# Patient Record
Sex: Male | Born: 1955 | Race: Black or African American | Hispanic: No | Marital: Single | Smoking: Former smoker
Health system: Southern US, Community
[De-identification: ages and names within clinical notes are randomized; demographics above are authoritative.]

## PROBLEM LIST (undated history)

## (undated) DIAGNOSIS — D638 Anemia in other chronic diseases classified elsewhere: Secondary | ICD-10-CM

## (undated) DIAGNOSIS — E119 Type 2 diabetes mellitus without complications: Secondary | ICD-10-CM

## (undated) DIAGNOSIS — N138 Other obstructive and reflux uropathy: Secondary | ICD-10-CM

## (undated) DIAGNOSIS — Z973 Presence of spectacles and contact lenses: Secondary | ICD-10-CM

## (undated) DIAGNOSIS — N200 Calculus of kidney: Secondary | ICD-10-CM

## (undated) DIAGNOSIS — R809 Proteinuria, unspecified: Secondary | ICD-10-CM

## (undated) DIAGNOSIS — Z87442 Personal history of urinary calculi: Secondary | ICD-10-CM

## (undated) DIAGNOSIS — E1129 Type 2 diabetes mellitus with other diabetic kidney complication: Secondary | ICD-10-CM

## (undated) DIAGNOSIS — E042 Nontoxic multinodular goiter: Secondary | ICD-10-CM

## (undated) DIAGNOSIS — N183 Chronic kidney disease, stage 3 unspecified: Secondary | ICD-10-CM

## (undated) DIAGNOSIS — E785 Hyperlipidemia, unspecified: Secondary | ICD-10-CM

## (undated) DIAGNOSIS — I639 Cerebral infarction, unspecified: Secondary | ICD-10-CM

## (undated) DIAGNOSIS — I10 Essential (primary) hypertension: Secondary | ICD-10-CM

## (undated) DIAGNOSIS — G629 Polyneuropathy, unspecified: Secondary | ICD-10-CM

## (undated) DIAGNOSIS — Z9221 Personal history of antineoplastic chemotherapy: Secondary | ICD-10-CM

## (undated) DIAGNOSIS — D649 Anemia, unspecified: Secondary | ICD-10-CM

## (undated) DIAGNOSIS — C801 Malignant (primary) neoplasm, unspecified: Secondary | ICD-10-CM

## (undated) DIAGNOSIS — N2581 Secondary hyperparathyroidism of renal origin: Secondary | ICD-10-CM

## (undated) DIAGNOSIS — R82991 Hypocitraturia: Secondary | ICD-10-CM

## (undated) DIAGNOSIS — N401 Enlarged prostate with lower urinary tract symptoms: Secondary | ICD-10-CM

## (undated) DIAGNOSIS — E8722 Chronic metabolic acidosis: Secondary | ICD-10-CM

## (undated) HISTORY — PX: APPENDECTOMY: SHX54

## (undated) HISTORY — DX: Anemia, unspecified: D64.9

---
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
## 1988-02-03 HISTORY — PX: PARTIAL COLECTOMY: SHX5273

## 1988-09-02 DIAGNOSIS — Z85038 Personal history of other malignant neoplasm of large intestine: Secondary | ICD-10-CM

## 1988-09-02 HISTORY — PX: SUBTOTAL COLECTOMY: SHX855

## 1988-09-02 HISTORY — DX: Personal history of other malignant neoplasm of large intestine: Z85.038

## 2000-01-21 ENCOUNTER — Encounter: Payer: Self-pay | Admitting: General Surgery

## 2000-01-21 ENCOUNTER — Encounter: Admission: RE | Admit: 2000-01-21 | Discharge: 2000-01-21 | Payer: Self-pay | Admitting: General Surgery

## 2002-02-02 DIAGNOSIS — Z8639 Personal history of other endocrine, nutritional and metabolic disease: Secondary | ICD-10-CM

## 2002-02-02 HISTORY — DX: Personal history of other endocrine, nutritional and metabolic disease: Z86.39

## 2002-11-20 ENCOUNTER — Encounter: Payer: Self-pay | Admitting: Internal Medicine

## 2002-11-20 ENCOUNTER — Ambulatory Visit (HOSPITAL_COMMUNITY): Admission: RE | Admit: 2002-11-20 | Discharge: 2002-11-20 | Payer: Self-pay | Admitting: Internal Medicine

## 2002-12-25 ENCOUNTER — Other Ambulatory Visit: Admission: RE | Admit: 2002-12-25 | Discharge: 2002-12-25 | Payer: Self-pay | Admitting: Diagnostic Radiology

## 2003-01-19 ENCOUNTER — Ambulatory Visit (HOSPITAL_COMMUNITY): Admission: RE | Admit: 2003-01-19 | Discharge: 2003-01-19 | Payer: Self-pay | Admitting: Endocrinology

## 2007-10-31 ENCOUNTER — Emergency Department (HOSPITAL_COMMUNITY): Admission: EM | Admit: 2007-10-31 | Discharge: 2007-10-31 | Payer: Self-pay | Admitting: Emergency Medicine

## 2009-03-14 ENCOUNTER — Emergency Department (HOSPITAL_COMMUNITY): Admission: EM | Admit: 2009-03-14 | Discharge: 2009-03-14 | Payer: Self-pay | Admitting: Emergency Medicine

## 2010-04-24 LAB — POCT CARDIAC MARKERS
CKMB, poc: <1 ng/mL — ABNORMAL LOW (ref 1.0–8.0)
Myoglobin, poc: 36.5 ng/mL (ref 12–200)

## 2010-04-24 LAB — DIFFERENTIAL
Basophils Relative: 1 % (ref 0–1)
Monocytes Relative: 7 % (ref 3–12)
Neutro Abs: 3 10*3/uL (ref 1.7–7.7)
Neutrophils Relative %: 58 % (ref 43–77)

## 2010-04-24 LAB — BASIC METABOLIC PANEL
BUN: 7 mg/dL (ref 6–23)
CO2: 25 mEq/L (ref 19–32)
Calcium: 8.9 mg/dL (ref 8.4–10.5)
Chloride: 105 mEq/L (ref 96–112)
Creatinine, Ser: 0.84 mg/dL (ref 0.4–1.5)
GFR calc Af Amer: >60 mL/min (ref >60)
GFR calc non Af Amer: >60 mL/min (ref >60)
Glucose, Bld: 303 mg/dL — ABNORMAL HIGH (ref 70–99)
Potassium: 3.9 mEq/L (ref 3.5–5.1)
Sodium: 137 mEq/L (ref 135–145)

## 2010-04-24 LAB — CBC
MCHC: 34.6 g/dL (ref 30.0–36.0)
RBC: 5.17 MIL/uL (ref 4.22–5.81)
WBC: 5.2 10*3/uL (ref 4.0–10.5)

## 2010-04-24 LAB — GLUCOSE, CAPILLARY: Glucose-Capillary: 291 mg/dL — ABNORMAL HIGH (ref 70–99)

## 2010-11-03 LAB — URINALYSIS, ROUTINE W REFLEX MICROSCOPIC
Bilirubin Urine: NEGATIVE
Glucose, UA: NEGATIVE
Ketones, ur: NEGATIVE
Leukocytes, UA: NEGATIVE
Protein, ur: NEGATIVE

## 2019-03-06 ENCOUNTER — Emergency Department (HOSPITAL_COMMUNITY)
Admission: EM | Admit: 2019-03-06 | Discharge: 2019-03-06 | Disposition: A | Discharge status: Home / Self Care | Payer: Self-pay | Attending: Emergency Medicine | Admitting: Emergency Medicine

## 2019-03-06 ENCOUNTER — Encounter (HOSPITAL_COMMUNITY): Payer: Self-pay | Admitting: Emergency Medicine

## 2019-03-06 ENCOUNTER — Other Ambulatory Visit: Payer: Self-pay

## 2019-03-06 DIAGNOSIS — E1165 Type 2 diabetes mellitus with hyperglycemia: Secondary | ICD-10-CM | POA: Insufficient documentation

## 2019-03-06 DIAGNOSIS — Y999 Unspecified external cause status: Secondary | ICD-10-CM | POA: Insufficient documentation

## 2019-03-06 DIAGNOSIS — T383X6A Underdosing of insulin and oral hypoglycemic [antidiabetic] drugs, initial encounter: Secondary | ICD-10-CM | POA: Insufficient documentation

## 2019-03-06 DIAGNOSIS — R739 Hyperglycemia, unspecified: Secondary | ICD-10-CM

## 2019-03-06 DIAGNOSIS — Z91120 Patient's intentional underdosing of medication regimen due to financial hardship: Secondary | ICD-10-CM | POA: Insufficient documentation

## 2019-03-06 DIAGNOSIS — Z5189 Encounter for other specified aftercare: Secondary | ICD-10-CM

## 2019-03-06 DIAGNOSIS — Y9389 Activity, other specified: Secondary | ICD-10-CM | POA: Insufficient documentation

## 2019-03-06 DIAGNOSIS — T23031A Burn of unspecified degree of multiple right fingers (nail), not including thumb, initial encounter: Secondary | ICD-10-CM | POA: Insufficient documentation

## 2019-03-06 DIAGNOSIS — X16XXXA Contact with hot heating appliances, radiators and pipes, initial encounter: Secondary | ICD-10-CM | POA: Insufficient documentation

## 2019-03-06 DIAGNOSIS — Y9289 Other specified places as the place of occurrence of the external cause: Secondary | ICD-10-CM | POA: Insufficient documentation

## 2019-03-06 DIAGNOSIS — Z23 Encounter for immunization: Secondary | ICD-10-CM | POA: Insufficient documentation

## 2019-03-06 HISTORY — DX: Malignant (primary) neoplasm, unspecified: C80.1

## 2019-03-06 HISTORY — DX: Type 2 diabetes mellitus without complications: E11.9

## 2019-03-06 LAB — CBG MONITORING, ED: Glucose-Capillary: 401 mg/dL — ABNORMAL HIGH (ref 70–99)

## 2019-03-06 MED ORDER — METFORMIN HCL 500 MG PO TABS: 500.0000 mg | ORAL_TABLET | Freq: Two times a day (BID) | ORAL | 6 refills | Status: DC

## 2019-03-06 MED ORDER — TETANUS-DIPHTH-ACELL PERTUSSIS 5-2.5-18.5 LF-MCG/0.5 IM SUSP
0.5000 mL | Freq: Once | INTRAMUSCULAR | Status: AC
  Administered 2019-03-06: 0.5 mL via INTRAMUSCULAR
  Filled 2019-03-06: qty 0.5
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
## 2019-03-20 ENCOUNTER — Other Ambulatory Visit: Payer: Self-pay

## 2019-03-20 ENCOUNTER — Encounter (HOSPITAL_COMMUNITY): Payer: Self-pay | Admitting: *Deleted

## 2019-03-20 ENCOUNTER — Observation Stay (HOSPITAL_COMMUNITY)
Admission: EM | Admit: 2019-03-20 | Discharge: 2019-03-21 | Disposition: A | Discharge status: Home / Self Care | Payer: Self-pay | Attending: Internal Medicine | Admitting: Internal Medicine

## 2019-03-20 ENCOUNTER — Emergency Department (HOSPITAL_COMMUNITY): Payer: Self-pay

## 2019-03-20 ENCOUNTER — Observation Stay (HOSPITAL_COMMUNITY): Payer: Self-pay

## 2019-03-20 DIAGNOSIS — G459 Transient cerebral ischemic attack, unspecified: Principal | ICD-10-CM | POA: Diagnosis present

## 2019-03-20 DIAGNOSIS — Z8673 Personal history of transient ischemic attack (TIA), and cerebral infarction without residual deficits: Secondary | ICD-10-CM

## 2019-03-20 DIAGNOSIS — Z7984 Long term (current) use of oral hypoglycemic drugs: Secondary | ICD-10-CM | POA: Insufficient documentation

## 2019-03-20 DIAGNOSIS — Z20822 Contact with and (suspected) exposure to covid-19: Secondary | ICD-10-CM | POA: Insufficient documentation

## 2019-03-20 DIAGNOSIS — I639 Cerebral infarction, unspecified: Secondary | ICD-10-CM

## 2019-03-20 DIAGNOSIS — Z79899 Other long term (current) drug therapy: Secondary | ICD-10-CM | POA: Insufficient documentation

## 2019-03-20 DIAGNOSIS — Z87891 Personal history of nicotine dependence: Secondary | ICD-10-CM | POA: Insufficient documentation

## 2019-03-20 DIAGNOSIS — E1165 Type 2 diabetes mellitus with hyperglycemia: Secondary | ICD-10-CM

## 2019-03-20 DIAGNOSIS — Z85038 Personal history of other malignant neoplasm of large intestine: Secondary | ICD-10-CM | POA: Insufficient documentation

## 2019-03-20 HISTORY — DX: Personal history of transient ischemic attack (TIA), and cerebral infarction without residual deficits: Z86.73

## 2019-03-20 LAB — COMPREHENSIVE METABOLIC PANEL
ALT: 24 U/L (ref 0–44)
AST: 25 U/L (ref 15–41)
Albumin: 3.8 g/dL (ref 3.5–5.0)
Alkaline Phosphatase: 58 U/L (ref 38–126)
Anion gap: 8 (ref 5–15)
BUN: 36 mg/dL — ABNORMAL HIGH (ref 8–23)
CO2: 20 mmol/L — ABNORMAL LOW (ref 22–32)
Calcium: 9.4 mg/dL (ref 8.9–10.3)
Chloride: 107 mmol/L (ref 98–111)
Creatinine, Ser: 1.24 mg/dL (ref 0.61–1.24)
GFR calc Af Amer: >60 mL/min (ref >60)
GFR calc non Af Amer: >60 mL/min (ref >60)
Glucose, Bld: 201 mg/dL — ABNORMAL HIGH (ref 70–99)
Potassium: 4.1 mmol/L (ref 3.5–5.1)
Sodium: 135 mmol/L (ref 135–145)
Total Bilirubin: 0.9 mg/dL (ref 0.3–1.2)
Total Protein: 6.9 g/dL (ref 6.5–8.1)

## 2019-03-20 LAB — CBC
HCT: 39.5 % (ref 39.0–52.0)
Hemoglobin: 13.2 g/dL (ref 13.0–17.0)
MCH: 29.7 pg (ref 26.0–34.0)
MCHC: 33.4 g/dL (ref 30.0–36.0)
MCV: 88.8 fL (ref 80.0–100.0)
Platelets: 263 10*3/uL (ref 150–400)
RBC: 4.45 MIL/uL (ref 4.22–5.81)
RDW: 12.3 % (ref 11.5–15.5)
WBC: 5 10*3/uL (ref 4.0–10.5)
nRBC: 0 % (ref 0.0–0.2)

## 2019-03-20 LAB — DIFFERENTIAL
Abs Immature Granulocytes: 0.03 10*3/uL (ref 0.00–0.07)
Basophils Absolute: 0.1 10*3/uL (ref 0.0–0.1)
Basophils Relative: 1 %
Eosinophils Absolute: 0.1 10*3/uL (ref 0.0–0.5)
Eosinophils Relative: 1 %
Immature Granulocytes: 1 %
Lymphocytes Relative: 38 %
Lymphs Abs: 1.9 10*3/uL (ref 0.7–4.0)
Monocytes Absolute: 0.4 10*3/uL (ref 0.1–1.0)
Monocytes Relative: 8 %
Neutro Abs: 2.6 10*3/uL (ref 1.7–7.7)
Neutrophils Relative %: 51 %

## 2019-03-20 LAB — URINALYSIS, ROUTINE W REFLEX MICROSCOPIC
Bacteria, UA: NONE SEEN
Bilirubin Urine: NEGATIVE
Glucose, UA: >=500 mg/dL — AB
Hgb urine dipstick: NEGATIVE
Ketones, ur: NEGATIVE mg/dL
Leukocytes,Ua: NEGATIVE
Nitrite: NEGATIVE
Protein, ur: NEGATIVE mg/dL
Specific Gravity, Urine: 1.021 (ref 1.005–1.030)
pH: 5 (ref 5.0–8.0)

## 2019-03-20 LAB — PROTIME-INR
INR: 1 (ref 0.8–1.2)
Prothrombin Time: 13.1 seconds (ref 11.4–15.2)

## 2019-03-20 LAB — I-STAT CHEM 8, ED
BUN: 33 mg/dL — ABNORMAL HIGH (ref 8–23)
Calcium, Ion: 1.33 mmol/L (ref 1.15–1.40)
Chloride: 108 mmol/L (ref 98–111)
Creatinine, Ser: 1.2 mg/dL (ref 0.61–1.24)
Glucose, Bld: 196 mg/dL — ABNORMAL HIGH (ref 70–99)
HCT: 39 % (ref 39.0–52.0)
Hemoglobin: 13.3 g/dL (ref 13.0–17.0)
Potassium: 4.1 mmol/L (ref 3.5–5.1)
Sodium: 137 mmol/L (ref 135–145)
TCO2: 21 mmol/L — ABNORMAL LOW (ref 22–32)

## 2019-03-20 LAB — GLUCOSE, CAPILLARY
Glucose-Capillary: 148 mg/dL — ABNORMAL HIGH (ref 70–99)
Glucose-Capillary: 180 mg/dL — ABNORMAL HIGH (ref 70–99)

## 2019-03-20 LAB — VITAMIN B12: Vitamin B-12: 564 pg/mL (ref 180–914)

## 2019-03-20 LAB — APTT: aPTT: 26 seconds (ref 24–36)

## 2019-03-20 LAB — HEMOGLOBIN A1C
Hgb A1c MFr Bld: 13.7 % — ABNORMAL HIGH (ref 4.8–5.6)
Mean Plasma Glucose: 346.49 mg/dL

## 2019-03-20 LAB — RAPID URINE DRUG SCREEN, HOSP PERFORMED
Amphetamines: NOT DETECTED
Barbiturates: NOT DETECTED
Benzodiazepines: NOT DETECTED
Cocaine: NOT DETECTED
Opiates: NOT DETECTED
Tetrahydrocannabinol: NOT DETECTED

## 2019-03-20 LAB — ETHANOL: Alcohol, Ethyl (B): <10 mg/dL (ref <10)

## 2019-03-20 IMAGING — DX DG CHEST 2V
2 series · 2 of 2 positions shown · non-contrast
Comparison: None.

CLINICAL DATA: Left hand numbness.  Possible stroke today.

EXAM:
CHEST - 2 VIEW

[chest pa]
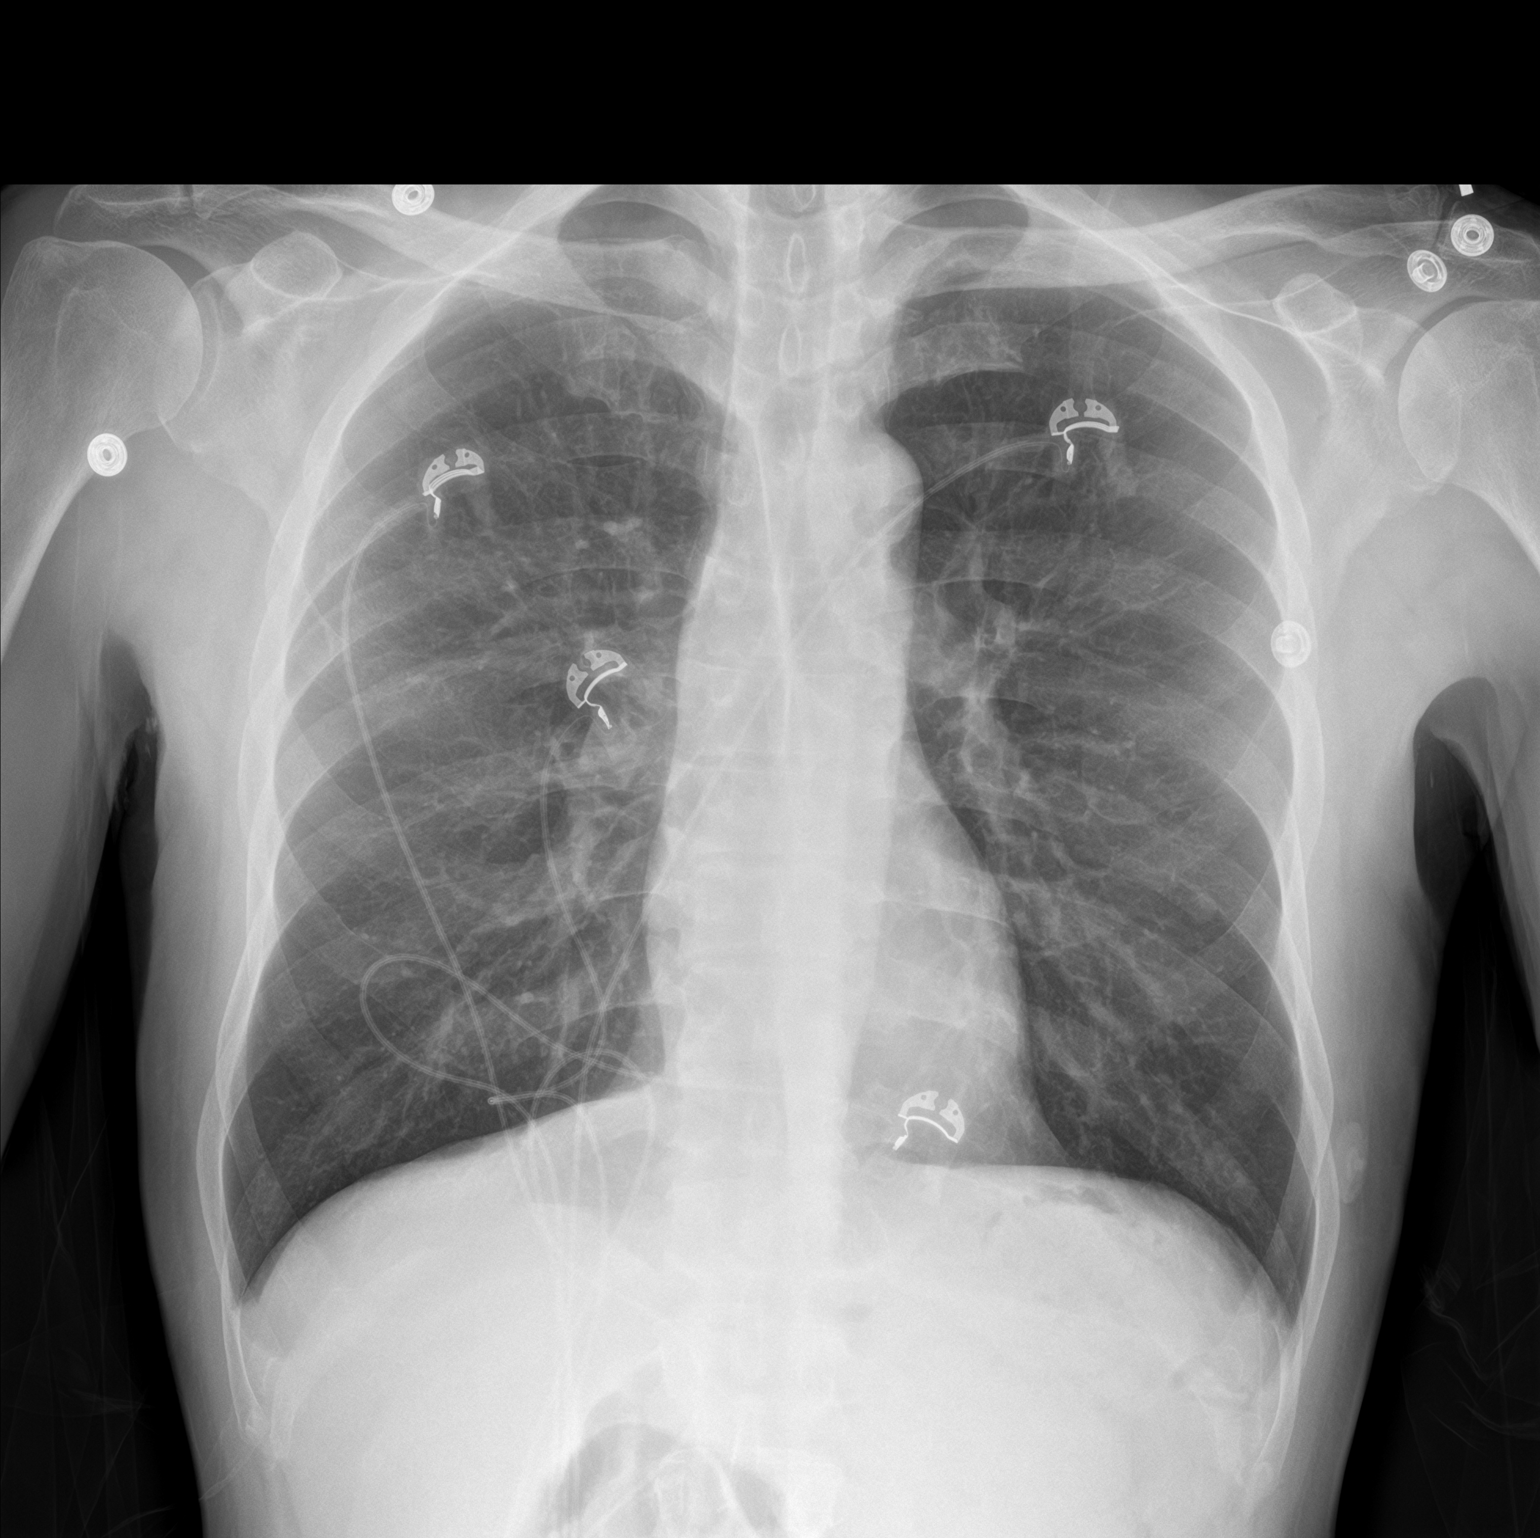

[chest lat]
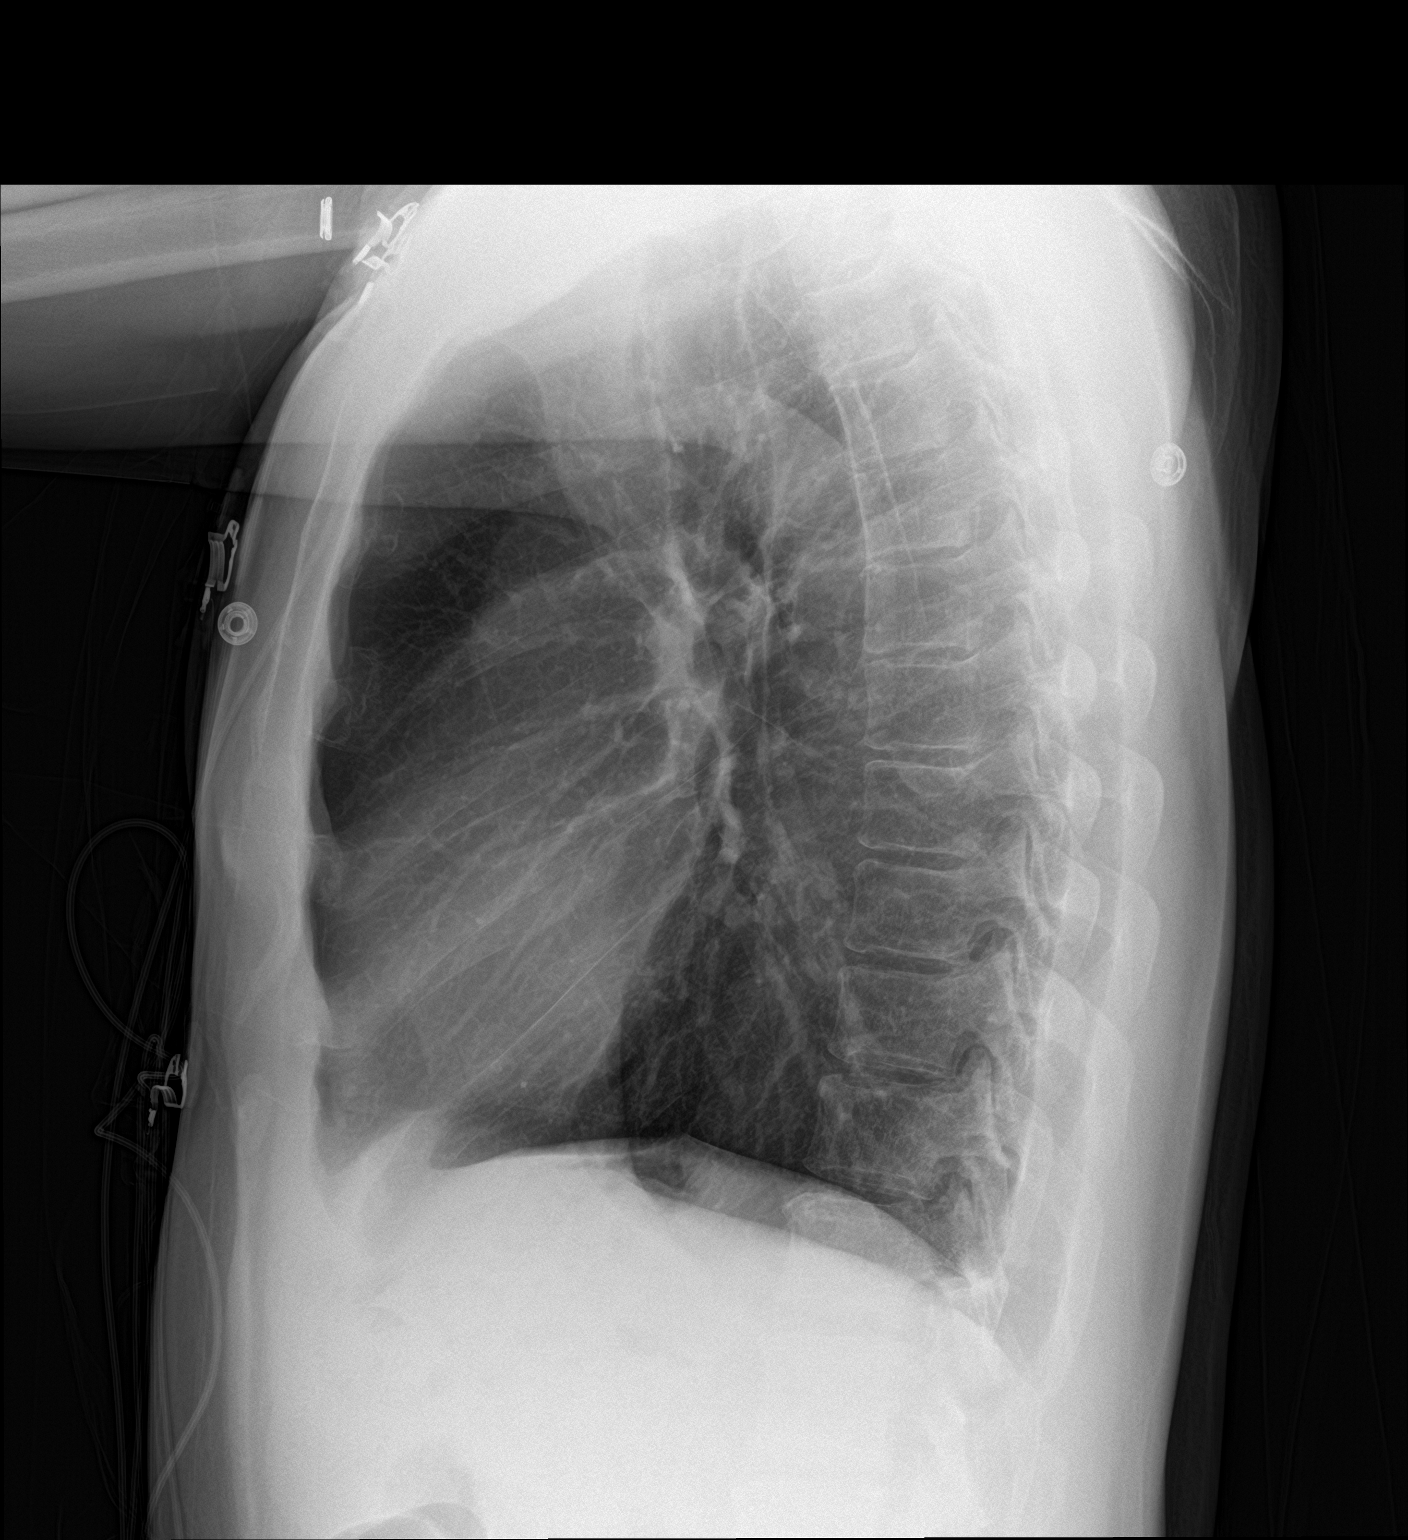

[2 of 2 positions shown; findings below may reference images not displayed]

FINDINGS: The chest is hyperexpanded with some attenuation of the pulmonary
vasculature. Lungs are clear. Heart size is normal. No pneumothorax
or pleural effusion. No acute or focal bony abnormality.
IMPRESSION: No acute disease.

The lungs appear emphysematous.

## 2019-03-20 IMAGING — CT CT HEAD CODE STROKE
3 series · 15 of 47 positions shown, 18 images · non-contrast
Comparison: None.

CLINICAL DATA: Code stroke. Left hand weakness beginning at 10
a.m.

EXAM:
CT HEAD WITHOUT CONTRAST
TECHNIQUE: Contiguous axial images were obtained from the base of the skull
through the vertex without intravenous contrast.

[Series 2: head w o · axial · 0.43mm/px · z∈[+65,+190]mm · 9 of 31 slices shown, 12 images]
[im 3/31  brain]
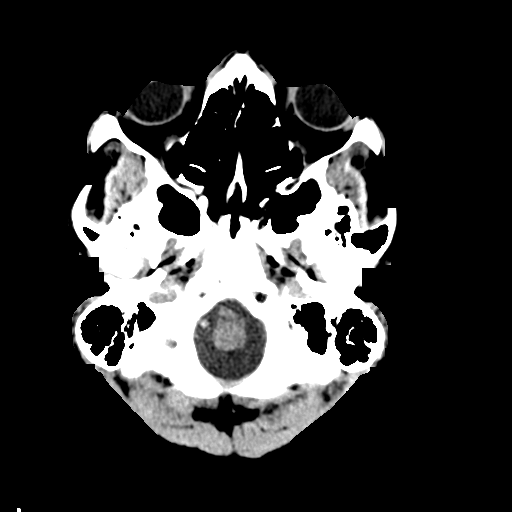
[im 3/31  bone]
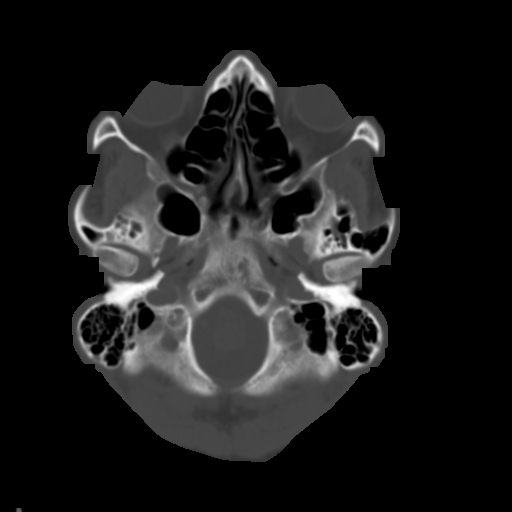
[im 6/31  brain]
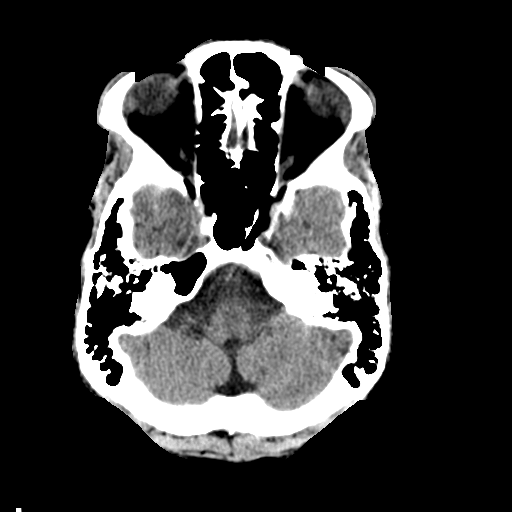
[im 9/31  brain]
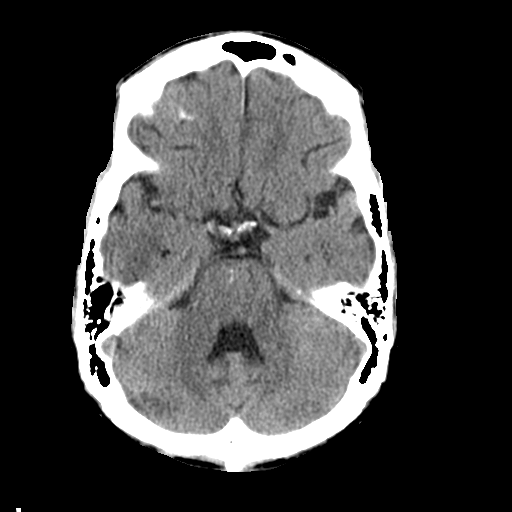
[im 12/31  brain]
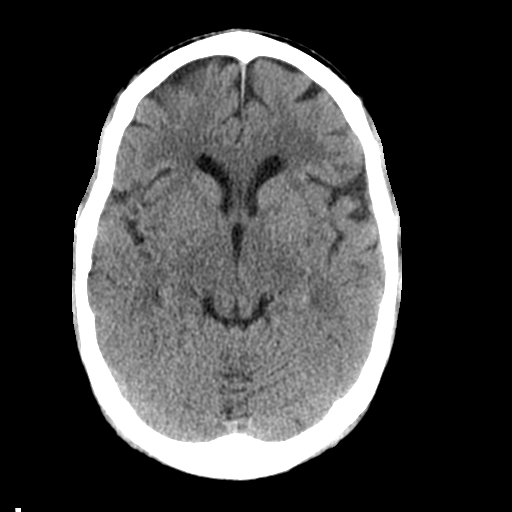
[im 16/31  brain]
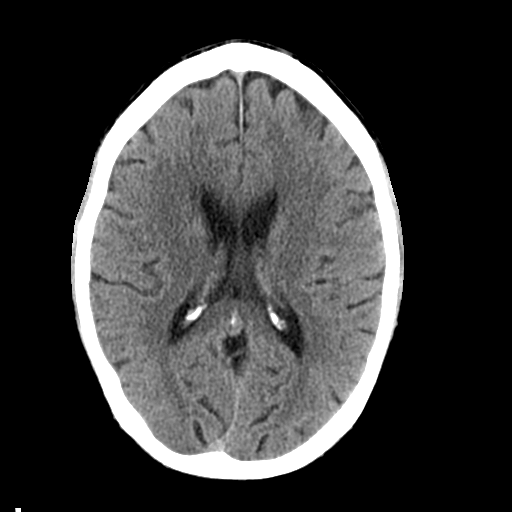
[im 16/31  bone]
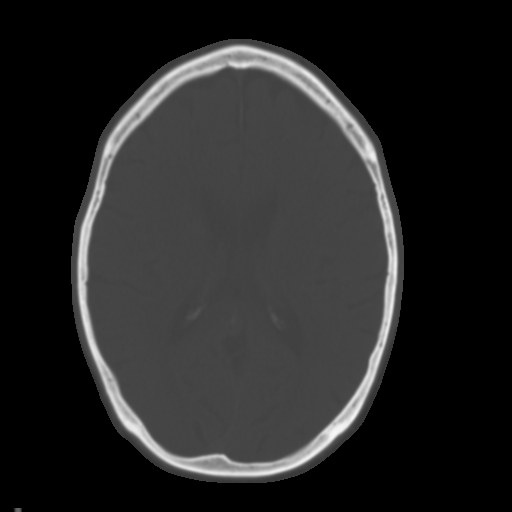
[im 19/31  brain]
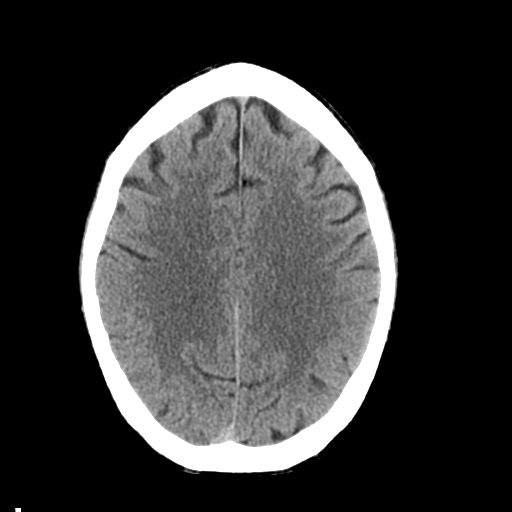
[im 22/31  brain]
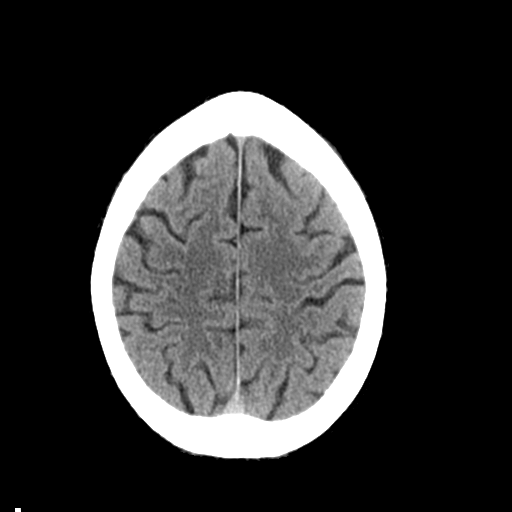
[im 25/31  brain]
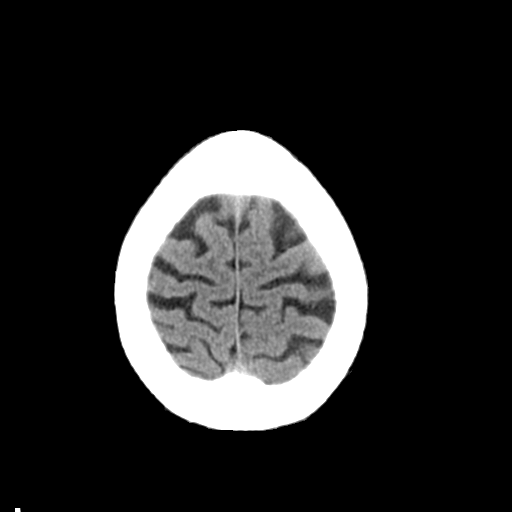
[im 28/31  brain]
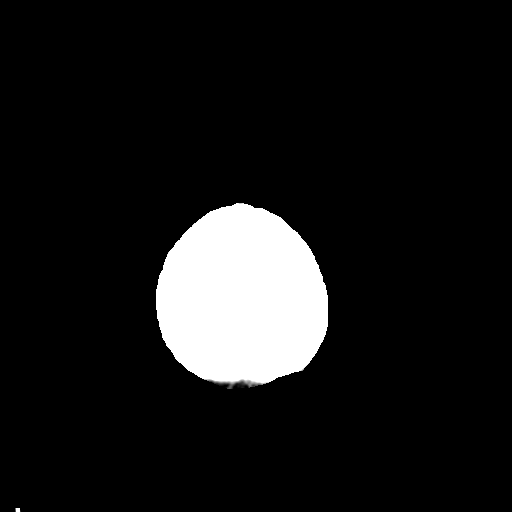
[im 28/31  bone]
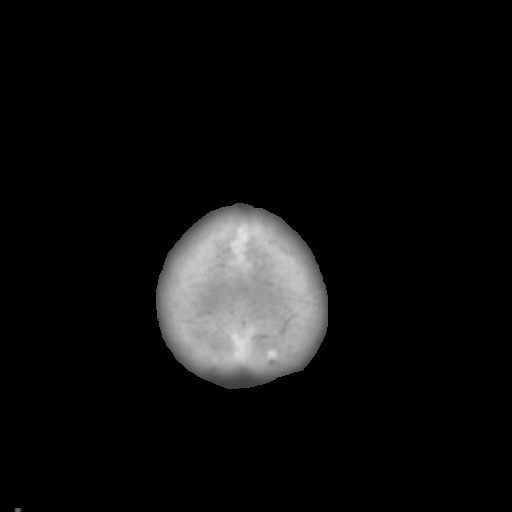

[Series 4: coronal soft · coronal · 0.31mm/px · 3 of 67 slices shown]
[im 23/67  brain]
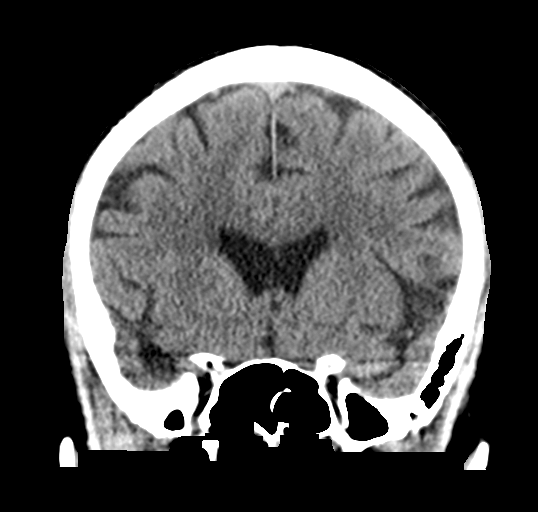
[im 30/67  brain]
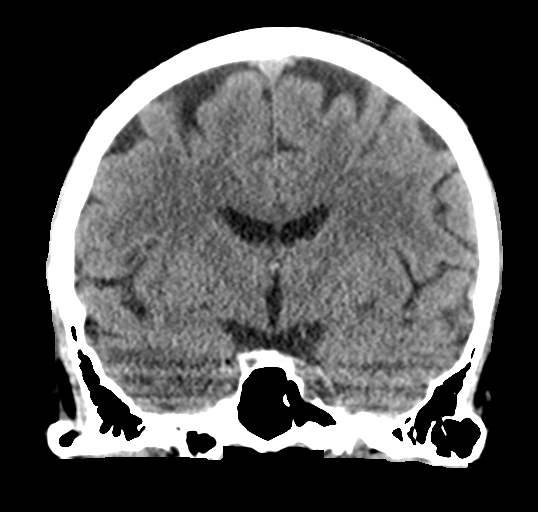
[im 37/67  brain]
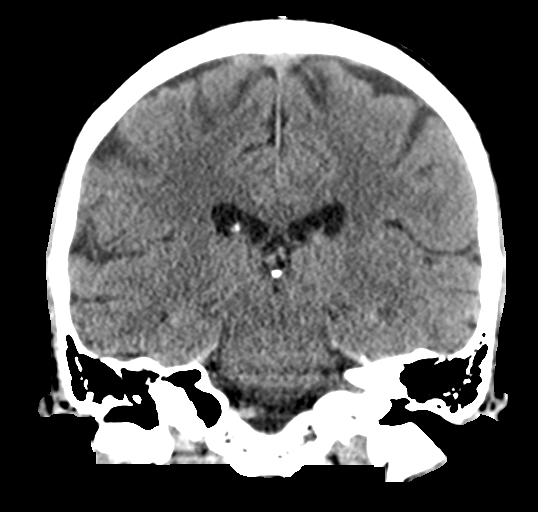

[Series 5: sagittal soft · sagittal · 0.31mm/px · 3 of 56 slices shown]
[im 19/56  brain]
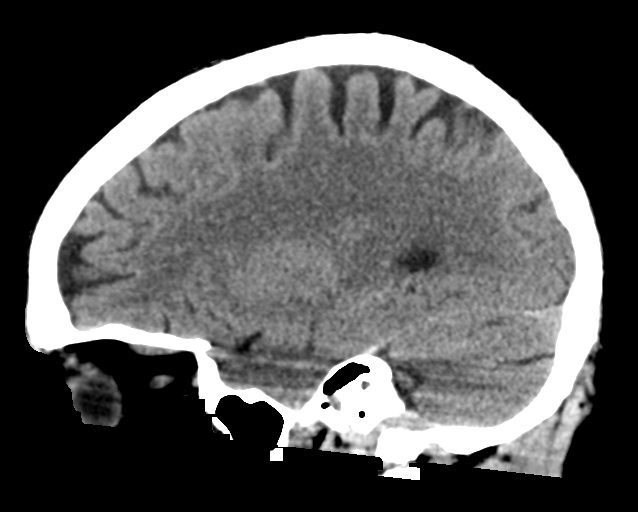
[im 28/56  brain]
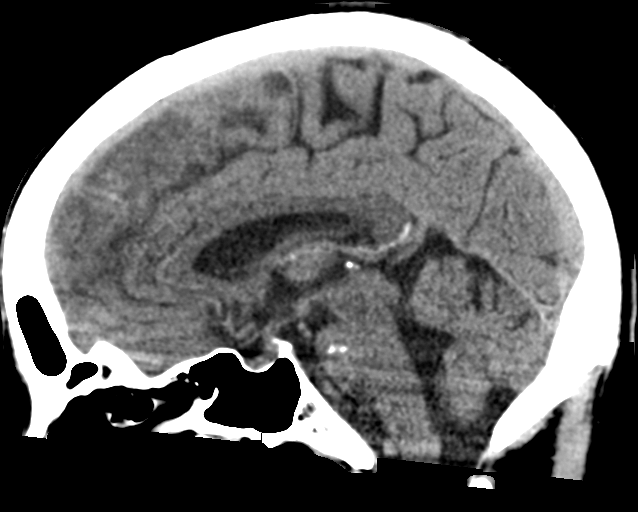
[im 37/56  brain]
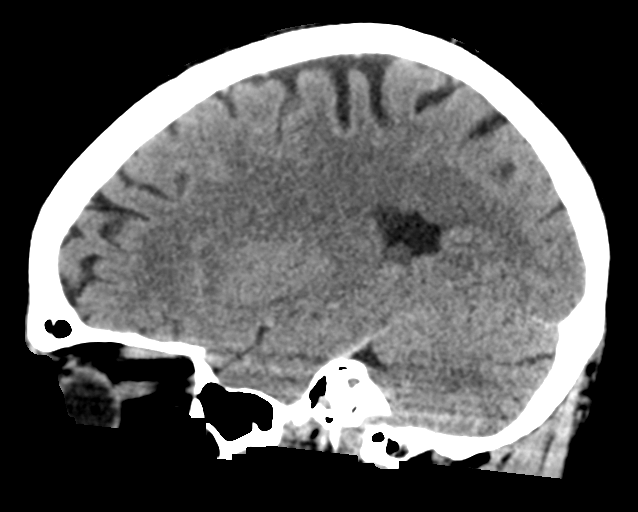

[15 of 47 positions shown; findings below may reference images not displayed]

FINDINGS: Brain: Mild atrophy and white matter hypoattenuation is present
bilaterally. No acute cortical infarct present. Basal ganglia are
intact. Insular ribbon is normal. No acute or focal cortical
abnormalities are present. The ventricles are of normal size. No
significant extraaxial fluid collection is present. The brainstem
and cerebellum are within normal limits.

Vascular: No hyperdense vessel or unexpected calcification.

Skull: Calvarium is intact. No focal lytic or blastic lesions are
present. No significant extracranial soft tissue lesion is present.

Sinuses/Orbits: The paranasal sinuses and mastoid air cells are
clear. The globes and orbits are within normal limits.

ASPECTS ([place] Stroke Program Early CT Score)

- Ganglionic level infarction (caudate, lentiform nuclei, internal
capsule, insula, M1-M3 cortex): [DATE]

- Supraganglionic infarction (M4-M6 cortex): [DATE]

Total score (0-10 with 10 being normal): [DATE]
IMPRESSION: 1. No acute intracranial abnormality.
2. Mild atrophy and white matter hypoattenuation likely reflects the
sequela of chronic microvascular ischemia.
3. ASPECTS is [DATE]

These results were called by telephone at the time of interpretation
on [DATE] at [DATE] to provider SAARTJIE, who verbally
acknowledged these results.

## 2019-03-20 MED ORDER — ACETAMINOPHEN 160 MG/5ML PO SOLN: 650.0000 mg | ORAL | Status: DC | PRN

## 2019-03-20 MED ORDER — SODIUM CHLORIDE 0.9 % IV SOLN: INTRAVENOUS | Status: DC

## 2019-03-20 MED ORDER — ONDANSETRON HCL 4 MG/2ML IJ SOLN: 4.0000 mg | Freq: Four times a day (QID) | INTRAMUSCULAR | Status: DC | PRN

## 2019-03-20 MED ORDER — ASPIRIN 325 MG PO TABS
325.0000 mg | ORAL_TABLET | Freq: Every day | ORAL | Status: DC
  Administered 2019-03-21: 09:00:00 325 mg via ORAL
  Filled 2019-03-20: qty 1

## 2019-03-20 MED ORDER — ASPIRIN 300 MG RE SUPP: 300.0000 mg | Freq: Every day | RECTAL | Status: DC

## 2019-03-20 MED ORDER — INSULIN ASPART 100 UNIT/ML ~~LOC~~ SOLN
0.0000 [IU] | Freq: Three times a day (TID) | SUBCUTANEOUS | Status: DC
  Administered 2019-03-21: 3 [IU] via SUBCUTANEOUS
  Administered 2019-03-21: 09:00:00 2 [IU] via SUBCUTANEOUS

## 2019-03-20 MED ORDER — ASPIRIN 325 MG PO TABS
325.0000 mg | ORAL_TABLET | Freq: Once | ORAL | Status: AC
  Administered 2019-03-20: 325 mg via ORAL
  Filled 2019-03-20: qty 1

## 2019-03-20 MED ORDER — INSULIN ASPART 100 UNIT/ML ~~LOC~~ SOLN: 0.0000 [IU] | Freq: Every day | SUBCUTANEOUS | Status: DC

## 2019-03-20 MED ORDER — ACETAMINOPHEN 325 MG PO TABS: 650.0000 mg | ORAL_TABLET | ORAL | Status: DC | PRN

## 2019-03-20 MED ORDER — OFLOXACIN 0.3 % OP SOLN
1.0000 [drp] | Freq: Four times a day (QID) | OPHTHALMIC | Status: DC
  Administered 2019-03-20 – 2019-03-21 (×4): 1 [drp] via OPHTHALMIC
  Filled 2019-03-20: qty 5

## 2019-03-20 MED ORDER — ACETAMINOPHEN 650 MG RE SUPP: 650.0000 mg | RECTAL | Status: DC | PRN

## 2019-03-20 MED ORDER — ENOXAPARIN SODIUM 40 MG/0.4ML ~~LOC~~ SOLN
40.0000 mg | SUBCUTANEOUS | Status: DC
  Administered 2019-03-20: 40 mg via SUBCUTANEOUS
  Filled 2019-03-20: qty 0.4

## 2019-03-20 MED ORDER — STROKE: EARLY STAGES OF RECOVERY BOOK: Freq: Once | Status: AC

## 2019-03-20 MED ORDER — SENNOSIDES-DOCUSATE SODIUM 8.6-50 MG PO TABS: 1.0000 | ORAL_TABLET | Freq: Every evening | ORAL | Status: DC | PRN

## 2019-03-20 NOTE — Consult Note (Signed)
[place] A. Merlene Laughter, MD     [email or web address]          Alec Bruce is an 64 y.o. male.   ASSESSMENT/PLAN: 1.  Transient left hand weakness likely due to transient ischemic attack.  Risk factors diabetes and age.  Aspirin is recommended.  Typical work-up has been initiated.  Also consider Lipitor or other statin. 2.  Left foot drop onset 1 year etiology possible radiculopathy given the history of diabetes.  However, there is some suggestion of cervical myelopathy on examination.  It is worthwhile doing a cervical MRI. 3.  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    The patient is a 64 year old right-handed black male who presented with the acute onset of left hand weakness about 10:45 AM.  The patient was initially considered for TPA but this was not given as his symptoms improved after he presented to the hospital.  The event symptoms lasted for about an hour.  He denies any associated dizziness, dysarthria, dysphagia or weakness of the other extremities.  He is noted to have a foot drop on the left which he tells me has had for about a year.  The patient denies any palpitation, chest pain or shortness of breath.  The patient apparently has not seen his primary care physician in a while.  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  He also has poor vision from cataracts.  The patient reports having reduced sensation and tingling in his feet which I suspect is most likely due to diabetes.  The review of systems otherwise negative.   GENERAL: This a very pleasant thin male who is in no acute distress.  HEENT: He has a marked right exotropia.  Neck is supple and no trauma appreciated.  ABDOMEN: soft  EXTREMITIES: No edema   BACK: Normal  SKIN: Normal by inspection.    MENTAL STATUS: Alert and oriented. Speech, language and cognition are generally intact. Judgment and insight normal.   CRANIAL NERVES: Pupils  are equal, round and reactive to light and accomodation; extra ocular movements are full, there is no significant nystagmus; visual fields are full; upper and lower facial muscles are normal in strength and symmetric, there is no flattening of the nasolabial folds; tongue is midline; uvula is midline; shoulder elevation is normal.  MOTOR: There is a left foot drop with slightly increased tone of the lower extremities bilaterally.  Otherwise, there is normal tone, bulk and strength throughout.  There is no drift of the upper extremities or legs.   COORDINATION: Left finger to nose is normal, right finger to nose is normal, No rest tremor; no intention tremor; no postural tremor; no bradykinesia.  REFLEXES: Deep tendon reflexes are symmetrical and normal in the upper extremities.  Cross adductors are noted however while tapping the left knee.  The reflexes however are generally reduced in the legs.  Plantar reflexes are equivocal bilaterally.   SENSATION: Normal to light touch, temperature, and pain.     Blood pressure 138/82, pulse (!) 113, temperature 97.9 F (36.6 C), temperature source Oral, resp. rate 20, height 6' (1.829 m), weight 61.6 kg, SpO2 100 %.  Past Medical History:  Diagnosis Date  . Cancer ([place])    colon cancer 90s  . Diabetes mellitus without complication [facility])     Past Surgical History:  Procedure Laterality Date  . APPENDECTOMY      History reviewed. No pertinent family history.  Social History:  reports that he has quit smoking. He has never used smokeless tobacco. He reports current alcohol use. He reports previous drug use.  Allergies: Not on File  Medications: Prior to Admission medications   Medication Sig Start Date End Date Taking? Authorizing Provider  metFORMIN (GLUCOPHAGE) 500 MG tablet Take 1 tablet (500 mg total) by mouth 2 (two) times daily with a meal. 03/06/19  Yes Daleen Bo, MD  ofloxacin (OCUFLOX) 0.3 % ophthalmic solution INSTILL 1 DROP IN  LEFT EYE FOUR TIMES DAILY FOR 1 WEEK THEN STOP 03/17/19  Yes [provider]  prednisoLONE acetate (PRED FORTE) 1 % ophthalmic suspension  03/17/19  Yes [provider]    Scheduled Meds: .  stroke: mapping our early stages of recovery book   Does not apply Once  . [START ON 03/21/2019] aspirin  300 mg Rectal Daily   Or  . [START ON 03/21/2019] aspirin  325 mg Oral Daily  . enoxaparin (LOVENOX) injection  40 mg Subcutaneous Q24H  . insulin aspart  0-5 Units Subcutaneous QHS  . [START ON 03/21/2019] insulin aspart  0-9 Units Subcutaneous TID WC  . ofloxacin  1 drop Left Eye QID   Continuous Infusions: . sodium chloride     PRN Meds:.acetaminophen **OR** acetaminophen (TYLENOL) oral liquid 160 mg/5 mL **OR** acetaminophen, ondansetron (ZOFRAN) IV, senna-docusate     Results for orders placed or performed during the hospital encounter of 03/20/19 (from the past 48 hour(s))  Ethanol     Status: None   Collection Time: 03/20/19  1:07 PM  Result Value Ref Range   Alcohol, Ethyl (B) <10 <10 mg/dL    Comment: (NOTE) Lowest detectable limit for serum alcohol is 10 mg/dL. For medical purposes only. Performed at [facility], [address] [place], [place] [address]   Protime-INR     Status: None   Collection Time: 03/20/19  1:07 PM  Result Value Ref Range   Prothrombin Time 13.1 11.4 - 15.2 seconds   INR 1.0 0.8 - 1.2    Comment: (NOTE) INR goal varies based on device and disease states. Performed at [facility], [address] [place], [place] [address]   APTT     Status: None   Collection Time: 03/20/19  1:07 PM  Result Value Ref Range   aPTT 26 24 - 36 seconds    Comment: Performed at [facility], [address] [place], [place] [address]  CBC     Status: None   Collection Time: 03/20/19  1:07 PM  Result Value Ref Range   WBC 5.0 4.0 - 10.5 K/uL   RBC 4.45 4.22 - 5.81 MIL/uL   Hemoglobin 13.2 13.0 - 17.0 g/dL   HCT 39.5 39.0 - 52.0 %   MCV 88.8 80.0 -  100.0 fL   MCH 29.7 26.0 - 34.0 pg   MCHC 33.4 30.0 - 36.0 g/dL   RDW 12.3 11.5 - 15.5 %   Platelets 263 150 - 400 K/uL   nRBC 0.0 0.0 - 0.2 %    Comment: Performed at [facility], [address] [place], [place] [address]  Differential     Status: None   Collection Time: 03/20/19  1:07 PM  Result Value Ref Range   Neutrophils Relative % 51 %   Neutro Abs 2.6 1.7 - 7.7 K/uL   Lymphocytes Relative 38 %   Lymphs Abs 1.9 0.7 - 4.0 K/uL   Monocytes Relative 8 %   Monocytes Absolute 0.4 0.1 -  1.0 K/uL   Eosinophils Relative 1 %   Eosinophils Absolute 0.1 0.0 - 0.5 K/uL   Basophils Relative 1 %   Basophils Absolute 0.1 0.0 - 0.1 K/uL   Immature Granulocytes 1 %   Abs Immature Granulocytes 0.03 0.00 - 0.07 K/uL    Comment: Performed at [facility], [address], [place], [place] [address]  Comprehensive metabolic panel     Status: Abnormal   Collection Time: 03/20/19  1:07 PM  Result Value Ref Range   Sodium 135 135 - 145 mmol/L   Potassium 4.1 3.5 - 5.1 mmol/L   Chloride 107 98 - 111 mmol/L   CO2 20 (L) 22 - 32 mmol/L   Glucose, Bld 201 (H) 70 - 99 mg/dL   BUN 36 (H) 8 - 23 mg/dL   Creatinine, Ser 1.24 0.61 - 1.24 mg/dL   Calcium 9.4 8.9 - 10.3 mg/dL   Total Protein 6.9 6.5 - 8.1 g/dL   Albumin 3.8 3.5 - 5.0 g/dL   AST 25 15 - 41 U/L   ALT 24 0 - 44 U/L   Alkaline Phosphatase 58 38 - 126 U/L   Total Bilirubin 0.9 0.3 - 1.2 mg/dL   GFR calc non Af Amer >60 >60 mL/min   GFR calc Af Amer >60 >60 mL/min   Anion gap 8 5 - 15    Comment: Performed at [facility], [address] [place], [place] [address]  I-stat chem 8, ED     Status: Abnormal   Collection Time: 03/20/19  1:10 PM  Result Value Ref Range   Sodium 137 135 - 145 mmol/L   Potassium 4.1 3.5 - 5.1 mmol/L   Chloride 108 98 - 111 mmol/L   BUN 33 (H) 8 - 23 mg/dL   Creatinine, Ser 1.20 0.61 - 1.24 mg/dL   Glucose, Bld 196 (H) 70 - 99 mg/dL   Calcium, Ion 1.33 1.15 - 1.40 mmol/L   TCO2 21 (L) 22 - 32 mmol/L    Hemoglobin 13.3 13.0 - 17.0 g/dL   HCT 39.0 39.0 - 52.0 %  Urine rapid drug screen (hosp performed)     Status: None   Collection Time: 03/20/19  2:44 PM  Result Value Ref Range   Opiates NONE DETECTED NONE DETECTED   Cocaine NONE DETECTED NONE DETECTED   Benzodiazepines NONE DETECTED NONE DETECTED   Amphetamines NONE DETECTED NONE DETECTED   Tetrahydrocannabinol NONE DETECTED NONE DETECTED   Barbiturates NONE DETECTED NONE DETECTED    Comment: (NOTE) DRUG SCREEN FOR MEDICAL PURPOSES ONLY.  IF CONFIRMATION IS NEEDED FOR ANY PURPOSE, NOTIFY LAB WITHIN 5 DAYS. LOWEST DETECTABLE LIMITS FOR URINE DRUG SCREEN Drug Class                     Cutoff (ng/mL) Amphetamine and metabolites    1000 Barbiturate and metabolites    200 Benzodiazepine                 [id number] Tricyclics and metabolites     300 Opiates and metabolites        300 Cocaine and metabolites        300 THC                            50 Performed at [facility], [address] [place], [place] [address]   Urinalysis, Routine w reflex microscopic     Status: Abnormal   Collection  Time: 03/20/19  2:44 PM  Result Value Ref Range   Color, Urine YELLOW YELLOW   APPearance HAZY (A) CLEAR   Specific Gravity, Urine 1.021 1.005 - 1.030   pH 5.0 5.0 - 8.0   Glucose, UA >=500 (A) NEGATIVE mg/dL   Hgb urine dipstick NEGATIVE NEGATIVE   Bilirubin Urine NEGATIVE NEGATIVE   Ketones, ur NEGATIVE NEGATIVE mg/dL   Protein, ur NEGATIVE NEGATIVE mg/dL   Nitrite NEGATIVE NEGATIVE   Leukocytes,Ua NEGATIVE NEGATIVE   RBC / HPF 11-20 0 - 5 RBC/hpf   WBC, UA 0-5 0 - 5 WBC/hpf   Bacteria, UA NONE SEEN NONE SEEN   Squamous Epithelial / LPF 0-5 0 - 5   Mucus PRESENT    Hyaline Casts, UA PRESENT     Comment: Performed at [facility], [address], [place], [place] [address]  Glucose, capillary     Status: Abnormal   Collection Time: 03/20/19  5:34 PM  Result Value Ref Range   Glucose-Capillary 148 (H) 70 - 99 mg/dL     Studies/Results:  HEAD CT FINDINGS: Brain: Mild atrophy and white matter hypoattenuation is present bilaterally. No acute cortical infarct present. Basal ganglia are intact. Insular ribbon is normal. No acute or focal cortical abnormalities are present. The ventricles are of normal size. No significant extraaxial fluid collection is present. The brainstem and cerebellum are within normal limits.  Vascular: No hyperdense vessel or unexpected calcification.  Skull: Calvarium is intact. No focal lytic or blastic lesions are present. No significant extracranial soft tissue lesion is present.  Sinuses/Orbits: The paranasal sinuses and mastoid air cells are clear. The globes and orbits are within normal limits.  ASPECTS [facility] Stroke Program Early CT Score)  - Ganglionic level infarction (caudate, lentiform nuclei, internal capsule, insula, M1-M3 cortex): 3/3  - Supraganglionic infarction (M4-M6 cortex): 7/7  Total score (0-10 with 10 being normal): 10/10  IMPRESSION: 1. No acute intracranial abnormality. 2. Mild atrophy and white matter hypoattenuation likely reflects the sequela of chronic microvascular ischemia. 3. ASPECTS is 10/10   The head CT scan is reviewed in person. There is reduced signal seen in the white matter area especially involving the pontine regions. This seems consistent with chronic microvascular changes. No acute hypodensity is noted however. No other acute findings.    Tahje Borawski A. Merlene Laughter, M.D.  Diplomate, Tax adviser of Psychiatry and Neurology ( Neurology). 03/20/2019, 5:41 PM

## 2019-03-20 NOTE — ED Notes (Signed)
WAITING ON TELENEURON TO ASSESS THE PATIENT AT THIS TIME.

## 2019-03-20 NOTE — ED Triage Notes (Signed)
C/o weakness in left hand onset 1 hour ago

## 2019-03-20 NOTE — Progress Notes (Signed)
CODE STROKE CT TIMES 1240 call time [id number] beeper time 1245 exam started 1248 exam finished 1248 images sent to soc 1249 exam completed in [place] radiology called.

## 2019-03-20 NOTE — H&P (Signed)
History and Physical  Alec Bruce [id number] DOB: 01-Jun-1955 DOA: 03/20/2019   PCP: Patient, No Pcp Per   Patient coming from: Home  Chief Complaint: left hand weakness  HPI:  Alec Bruce is a 64 y.o. male with medical history of remote colon cancer, diabetes mellitus presenting with left hand weakness that began around 10:45 AM on 03/20/2019.  The patient states that he was chopping wood earlier in the day outside without any difficulty or symptoms.  He finished chopping wood around 10:30 AM.  He denied any other symptoms prior to his left hand weakness onset.  He denied any dysarthria, facial droop, dysarthria, visual disturbance, other focal extremity weakness, or dysesthesia.  He denies any fevers, chills, chest pain, coughing, hemoptysis, headache.  He denies any new medications except for Metformin which she was started 2 weeks prior to this admission.  The patient states that he has not had PCP follow-up for many years.  He visited emergency department on 03/06/2019 secondary to injury of his right hand and was started on Metformin at that time. Upon arrival to the emergency department, the patient's symptoms of his left hand weakness was still present, but completely resolved by the time of EDP evaluation.  The patient was afebrile hemodynamically stable saturating 100% room air.  BMP, LFTs, and CBC were unremarkable.  CT of the brain was negative.  Urine drug screen was negative.  EKG shows sinus rhythm with nonspecific T wave changes.  The patient was admitted for work-up of TIA.  Assessment/Plan: Left hand weakness -Concern about TIA/stroke -Neurology Consult -PT/OT evaluation -Speech therapy eval -CT brain--neg -MRI brain-- -MRA brain-- -Carotid Duplex-- -Echo-- -LDL-- -HbA1C-- -Antiplatelet--ASA 325 mg  Diabetes mellitus type 2 -Holding Metformin -Hemoglobin A1c -NovoLog sliding scale          Past Medical History:  Diagnosis Date  . Cancer  ([place])    colon cancer 90s  . Diabetes mellitus without complication [facility])    Past Surgical History:  Procedure Laterality Date  . APPENDECTOMY     Social History:  reports that he has quit smoking. He has never used smokeless tobacco. He reports current alcohol use. He reports previous drug use.  Family History reviewed. No pertinent family history.     Prior to Admission medications   Medication Sig Start Date End Date Taking? Authorizing Provider  metFORMIN (GLUCOPHAGE) 500 MG tablet Take 1 tablet (500 mg total) by mouth 2 (two) times daily with a meal. 03/06/19  Yes Daleen Bo, MD  ofloxacin (OCUFLOX) 0.3 % ophthalmic solution INSTILL 1 DROP IN LEFT EYE FOUR TIMES DAILY FOR 1 WEEK THEN STOP 03/17/19  Yes [provider]  prednisoLONE acetate (PRED FORTE) 1 % ophthalmic suspension  03/17/19  Yes [provider]    Review of Systems:  Constitutional:  No weight loss, night sweats, Fevers, chills, fatigue.  Head&Eyes: No headache.  No vision loss.  No eye pain or scotoma ENT:  No Difficulty swallowing,Tooth/dental problems,Sore throat,  No ear ache, post nasal drip,  Cardio-vascular:  No chest pain, Orthopnea, PND, swelling in lower extremities,  dizziness, palpitations  GI:  No  abdominal pain, nausea, vomiting, diarrhea, loss of appetite, hematochezia, melena, heartburn, indigestion, Resp:  No shortness of breath with exertion or at rest. No cough. No coughing up of blood .No wheezing.No chest wall deformity  Skin:  no rash or lesions.  GU:  no dysuria, change in color of urine, no urgency or frequency. No flank pain.  Musculoskeletal:  No joint pain or swelling. No decreased range of motion. No back pain.  Psych:  No change in mood or affect. No depression or anxiety. Neurologic: No headache, no dysesthesia, no focal weakness, no vision loss. No syncope; +left hand weak  Physical Exam: Vitals:   03/20/19 1139 03/20/19 1301 03/20/19 1302  BP:  130/81 (!) 144/91   Pulse: (!) 108  (!) 107  Resp: 16    Temp: 97.7 F (36.5 C)    TempSrc: Oral    SpO2: 100%  100%   General:  A&O x 3, NAD, nontoxic, pleasant/cooperative Head/Eye: No conjunctival hemorrhage, no icterus, [place]/AT, No nystagmus ENT:  No icterus,  No thrush, good dentition, no pharyngeal exudate Neck:  No masses, no lymphadenpathy, no bruits CV:  RRR, no rub, no gallop, no S3 Lung:  Diminished breath sounds.  Bibasilar rales. No wheeze Abdomen: soft/NT, +BS, nondistended, no peritoneal signs Ext: No cyanosis, No rashes, No petechiae, No lymphangitis, No edema Neuro: CNII-XII intact, strength 4/5 in bilateral upper and lower extremities, no dysmetria  Labs on Admission:  Basic Metabolic Panel: Recent Labs  Lab 03/20/19 1307 03/20/19 1310  NA 135 137  K 4.1 4.1  CL 107 108  CO2 20*  --   GLUCOSE 201* 196*  BUN 36* 33*  CREATININE 1.24 1.20  CALCIUM 9.4  --    Liver Function Tests: Recent Labs  Lab 03/20/19 1307  AST 25  ALT 24  ALKPHOS 58  BILITOT 0.9  PROT 6.9  ALBUMIN 3.8   No results for input(s): LIPASE, AMYLASE in the last 168 hours. No results for input(s): AMMONIA in the last 168 hours. CBC: Recent Labs  Lab 03/20/19 1307 03/20/19 1310  WBC 5.0  --   NEUTROABS 2.6  --   HGB 13.2 13.3  HCT 39.5 39.0  MCV 88.8  --   PLT 263  --    Coagulation Profile: Recent Labs  Lab 03/20/19 1307  INR 1.0   Cardiac Enzymes: No results for input(s): CKTOTAL, CKMB, CKMBINDEX, TROPONINI in the last 168 hours. BNP: Invalid input(s): POCBNP CBG: No results for input(s): GLUCAP in the last 168 hours. Urine analysis:    Component Value Date/Time   COLORURINE YELLOW 03/20/2019 1444   APPEARANCEUR HAZY (A) 03/20/2019 1444   LABSPEC 1.021 03/20/2019 1444   PHURINE 5.0 03/20/2019 1444   GLUCOSEU >=500 (A) 03/20/2019 1444   HGBUR NEGATIVE 03/20/2019 1444   BILIRUBINUR NEGATIVE 03/20/2019 1444   KETONESUR NEGATIVE 03/20/2019 1444   PROTEINUR  NEGATIVE 03/20/2019 1444   UROBILINOGEN 0.2 10/31/2007 0700   NITRITE NEGATIVE 03/20/2019 1444   LEUKOCYTESUR NEGATIVE 03/20/2019 1444   Sepsis Labs: @LABRCNTIP (procalcitonin:4,lacticidven:4) )No results found for this or any previous visit (from the past 240 hour(s)).   Radiological Exams on Admission: CT HEAD CODE STROKE WO CONTRAST  Result Date: 03/20/2019 CLINICAL DATA:  Code stroke. Left hand weakness beginning at 10 a.m. EXAM: CT HEAD WITHOUT CONTRAST TECHNIQUE: Contiguous axial images were obtained from the base of the skull through the vertex without intravenous contrast. COMPARISON:  None. FINDINGS: Brain: Mild atrophy and white matter hypoattenuation is present bilaterally. No acute cortical infarct present. Basal ganglia are intact. Insular ribbon is normal. No acute or focal cortical abnormalities are present. The ventricles are of normal size. No significant extraaxial fluid collection is present. The brainstem and cerebellum are within normal limits. Vascular: No hyperdense vessel or unexpected calcification. Skull: Calvarium is intact. No focal lytic or blastic lesions are present.  No significant extracranial soft tissue lesion is present. Sinuses/Orbits: The paranasal sinuses and mastoid air cells are clear. The globes and orbits are within normal limits. ASPECTS [facility] Stroke Program Early CT Score) - Ganglionic level infarction (caudate, lentiform nuclei, internal capsule, insula, M1-M3 cortex): 3/3 - Supraganglionic infarction (M4-M6 cortex): 7/7 Total score (0-10 with 10 being normal): 10/10 IMPRESSION: 1. No acute intracranial abnormality. 2. Mild atrophy and white matter hypoattenuation likely reflects the sequela of chronic microvascular ischemia. 3. ASPECTS is 10/10 These results were called by telephone at the time of interpretation on 03/20/2019 at 1:06 pm to provider Noemi Chapel, who verbally acknowledged these results. Electronically Signed   By: San Morelle M.D.    On: 03/20/2019 13:06    EKG: Independently reviewed. Sinus, nonspecific T wave changes    Time spent:60 minutes Code Status:   FULL Family Communication:  No Family at bedside Disposition Plan: expect 1-2 day hospitalization Consults called: neurology DVT Prophylaxis: [place] Lovenox   Alec Eva, DO  [facility] Pager [phone]  If 7PM-7AM, please contact night-coverage [email or web address] Password [facility] 03/20/2019, 3:54 PM

## 2019-03-20 NOTE — ED Provider Notes (Signed)
[facility] EMERGENCY DEPARTMENT Provider Note   CSN: [id number] Arrival date & time: 03/20/19  1131     History Chief Complaint  Patient presents with  . Extremity Weakness    Alec Bruce is a 64 y.o. male with a history of diabetes mellitus as well as colon cancer in the 1990s in remission who presents to the ED with complaints of L hand weakness onset 10:45 AM. Patient states that he was at home doing things around the house when he noted L hand grip weakness, he further describes this as not being able to use his left hand to buckle his belt or to pick up and turn over his cell phone. This concerned him prompting ED visit. Weakness waxing/waning since onset, seemed to improve after arrival but is worsening as I am interviewing him per his report. No specific alleviating/aggravating factors. He has not had any other weakness (RUE, LEs, facial). Denies headache, acute visual disturbance, numbness, paresthesias, dizziness, slurred speech, facial droop, or recent head trauma. He was cutting wood outside earlier today but did not note any pain or weakness during this or afterwards, onset of sxs was very much after this activity. He denies arthralgias or myalgias.  Last known normal 10:45.   HPI     Past Medical History:  Diagnosis Date  . Cancer ([place])    colon cancer 90s  . Diabetes mellitus without complication ([place])     There are no problems to display for this patient.   Past Surgical History:  Procedure Laterality Date  . APPENDECTOMY         History reviewed. No pertinent family history.  Social History   Tobacco Use  . Smoking status: Former Research scientist (life sciences)  . Smokeless tobacco: Never Used  Substance Use Topics  . Alcohol use: Yes    Comment: occ  . Drug use: Not Currently    Home Medications Prior to Admission medications   Medication Sig Start Date End Date Taking? Authorizing Provider  metFORMIN (GLUCOPHAGE) 500 MG tablet Take 1 tablet (500 mg total) by mouth  2 (two) times daily with a meal. 03/06/19   Daleen Bo, MD    Allergies    Patient has no allergy information on record.  Review of Systems   Review of Systems  Constitutional: Negative for chills and fever.  Eyes: Negative for visual disturbance.  Respiratory: Negative for shortness of breath.   Cardiovascular: Negative for chest pain.  Gastrointestinal: Negative for abdominal pain and vomiting.  Musculoskeletal: Negative for arthralgias, gait problem and myalgias.  Neurological: Positive for weakness. Negative for dizziness, seizures, syncope, facial asymmetry, speech difficulty, light-headedness, numbness and headaches.  All other systems reviewed and are negative.   Physical Exam Updated Vital Signs BP 130/81 (BP Location: Right Arm)   Pulse (!) 108   Temp 97.7 F (36.5 C) (Oral)   Resp 16   SpO2 100%   Physical Exam Vitals and nursing note reviewed.  Constitutional:      General: He is not in acute distress.    Appearance: Normal appearance. He is well-developed. He is not ill-appearing or toxic-appearing.  HENT:     Head: Normocephalic and atraumatic.  Eyes:     General:        Right eye: No discharge.        Left eye: No discharge.     Conjunctiva/sclera: Conjunctivae normal.  Neck:     Comments: No midline tenderness.  Cardiovascular:     Rate and Rhythm: Normal rate  and regular rhythm.     Pulses:          Radial pulses are 2+ on the right side and 2+ on the left side.  Pulmonary:     Effort: Pulmonary effort is normal. No respiratory distress.     Breath sounds: Normal breath sounds. No wheezing, rhonchi or rales.  Abdominal:     General: There is no distension.     Palpations: Abdomen is soft.     Tenderness: There is no abdominal tenderness.  Musculoskeletal:     Cervical back: Normal range of motion and neck supple.     Comments: Upper extremities: dorsal portion of some digits have cracked/irritated skin w/o infection. Patient has intact AROM  throughout. No bony tenderness to palpation. .   Skin:    General: Skin is warm and dry.     Capillary Refill: Capillary refill takes less than 2 seconds.     Findings: No rash.  Neurological:     Mental Status: He is alert.     Comments: Alert. Clear speech. No facial droop. CNIII-XII grossly intact. Bilateral upper and lower extremities' sensation grossly intact. 5/5 symmetric strength with grip strength, thumb abduction, wrist flexion/extension, elbow flexion/extension, shoulder flexion/extension, knee flexion/extension, and with plantar and dorsi flexion bilaterally. . Normal finger to nose bilaterally. Negative pronator drift. Negative Romberg sign. Gait is steady and intact   Psychiatric:        Mood and Affect: Mood normal.        Behavior: Behavior normal.     ED Results / Procedures / Treatments   Labs (all labs ordered are listed, but only abnormal results are displayed) Labs Reviewed  COMPREHENSIVE METABOLIC PANEL - Abnormal; Notable for the following components:      Result Value   CO2 20 (*)    Glucose, Bld 201 (*)    BUN 36 (*)    All other components within normal limits  I-STAT CHEM 8, ED - Abnormal; Notable for the following components:   BUN 33 (*)    Glucose, Bld 196 (*)    TCO2 21 (*)    All other components within normal limits  ETHANOL  PROTIME-INR  APTT  CBC  DIFFERENTIAL  RAPID URINE DRUG SCREEN, HOSP PERFORMED  URINALYSIS, ROUTINE W REFLEX MICROSCOPIC    EKG EKG Interpretation  Date/Time:  Monday March 20 2019 13:13:49 EST Ventricular Rate:  102 PR Interval:    QRS Duration: 89 QT Interval:  330 QTC Calculation: 430 R Axis:   83 Text Interpretation: Sinus tachycardia Borderline right axis deviation Anteroseptal infarct, old Confirmed by Nat Christen [id number]) on 03/20/2019 1:22:41 PM   Radiology CT HEAD CODE STROKE WO CONTRAST  Result Date: 03/20/2019 CLINICAL DATA:  Code stroke. Left hand weakness beginning at 10 a.m. EXAM: CT HEAD  WITHOUT CONTRAST TECHNIQUE: Contiguous axial images were obtained from the base of the skull through the vertex without intravenous contrast. COMPARISON:  None. FINDINGS: Brain: Mild atrophy and white matter hypoattenuation is present bilaterally. No acute cortical infarct present. Basal ganglia are intact. Insular ribbon is normal. No acute or focal cortical abnormalities are present. The ventricles are of normal size. No significant extraaxial fluid collection is present. The brainstem and cerebellum are within normal limits. Vascular: No hyperdense vessel or unexpected calcification. Skull: Calvarium is intact. No focal lytic or blastic lesions are present. No significant extracranial soft tissue lesion is present. Sinuses/Orbits: The paranasal sinuses and mastoid air cells are clear. The globes and orbits  are within normal limits. ASPECTS [facility] Stroke Program Early CT Score) - Ganglionic level infarction (caudate, lentiform nuclei, internal capsule, insula, M1-M3 cortex): 3/3 - Supraganglionic infarction (M4-M6 cortex): 7/7 Total score (0-10 with 10 being normal): 10/10 IMPRESSION: 1. No acute intracranial abnormality. 2. Mild atrophy and white matter hypoattenuation likely reflects the sequela of chronic microvascular ischemia. 3. ASPECTS is 10/10 These results were called by telephone at the time of interpretation on 03/20/2019 at 1:06 pm to provider Noemi Chapel, who verbally acknowledged these results. Electronically Signed   By: San Morelle M.D.   On: 03/20/2019 13:06    Procedures Procedures (including critical care time)  Medications Ordered in ED Medications - No data to display  ED Course  I have reviewed the triage vital signs and the nursing notes.  Pertinent labs & imaging results that were available during my care of the patient were reviewed by me and considered in my medical decision making (see chart for details).    MDM Rules/Calculators/A&P                       Patient presents to the ED with complaints of L hand grip weakness onset 10:45 AM- last known normal. No appreciable weakness on exam, no focal neuro deficits. Discussed findings & plan of care with supervising physician Dr. Lacinda Axon immediately following assessment- will activate code stroke. --> During Dr. Jonathon Jordan assessment shortly after my evaluation patient reported resolving sxs, remained w/o focal deficits, therefore cancelled code stroke and changed this to teleneuro consult.   CT head: No acute intracranial abnormality.  Changes that likely reflect the sequela of chronic microvascular ischemia. Labs reviewed and compared to prior on record, fairly unremarkable, hyperglycemia noted.  EKG with sinus tachycardia, no STEMI.  Seen by teleneurologist Dr. Samuel Germany who has discussed w/ Dr. Kerrie Pleasure for TIA, recommendation for admission with telemetry, initiate aspirin 325 daily, as well as an MRI and MRA. Additional recommendations per his note.   14:46: CONSULT: Given no MRI @ this facility today discussed with neurologist Dr. Merlene Laughter, recommends admission to this facility with MRI/MRA tomorrow.   15:33: CONSULT: Discussed with hospitalist Dr. Carles Collet- accepts admission.   Findings and plan of care discussed with supervising physician Dr. Lacinda Axon who has evaluated the patient & is in agreement.   Final Clinical Impression(s) / ED Diagnoses Final diagnoses:  TIA (transient ischemic attack)    Rx / DC Orders ED Discharge Orders    None       Leafy Kindle 03/20/19 1535    Nat Christen, MD 03/21/19 [phone]

## 2019-03-20 NOTE — Consult Note (Signed)
[facility] TeleNeurology Consult Services  Stat Consult  Date of Service:   03/20/2019 13:09:41  Impression:     .  G45.9 - Transient cerebral ischaemic attack, unspecified  Comments/Sign-Out: 64yo man w/PMH of DM, right eye blindness, cataracts, colon cancer p/w left hand weakness on 03/20/19. At 10:45 this morning he was outside chopping wood when he left hand became weak. It improved in the ED. He currently denies complaints. He denies numbness, vision or speech changes, dizziness, imbalance, LOC, headache, chest pain, recent illness, prior episodes. NIHSS 0, exam unremarkable aside from baseline poor vision due to cataracts. CT Head has no acute findings. Pt is not a candidate for alteplase due to resolved symptoms/lack of new disabling deficits. Presentation is concerning for transient ischemic attack or acute small vessel ischemic stroke based on history and exam, stroke workup advised.  CT HEAD: Showed No Acute Hemorrhage or Acute Core Infarct  Metrics: [facility] Notification Time: 03/20/2019 13:06:54 Stamp Time: 03/20/2019 13:09:41 Callback Response Time: 03/20/2019 13:12:19  Our recommendations are outlined below.  Recommendations:     .  Initiate Aspirin 325 MG Daily     .  Telemetry     .  Normal Saline     .  SCDs for DVT prophylaxis     .  Optimize blood pressure, temp, glucose  Imaging Studies:     .  MRI Head Without Contrast     .  MRA Head and Neck Without Contrast When Available - Stroke Protocol     .  Echocardiogram - Transthoracic Echocardiogram  Therapies:     .  Physical Therapy, Occupational Therapy, Speech Therapy Assessment When Applicable  Disposition: Neurology Follow Up Recommended  Sign Out:     .  Discussed with Emergency Department Provider  ----------------------------------------------------------------------------------------------------  Chief Complaint: left hand weakness  History of Present  Illness: Patient is a 64 year old Male.  64yo man w/PMH of DM, right eye blindness, cataracts, colon cancer p/w left hand weakness on 03/20/19. At 10:45 this morning he was outside chopping wood when he left hand became weak. It improved in the ED. He currently denies complaints. He denies numbness, vision or speech changes, dizziness, imbalance, LOC, headache, chest pain, recent illness, prior episodes.   Past Medical History:     . Diabetes Mellitus     . There is NO history of Hypertension     . There is NO history of Hyperlipidemia     . There is NO history of Atrial Fibrillation     . There is NO history of Coronary Artery Disease     . There is NO history of Stroke  Anticoagulant use:  No  Antiplatelet use: No    Examination: BP(144/91), Pulse(107), Blood Glucose(401) 1A: Level of Consciousness - Alert; keenly responsive + 0 1B: Ask Month and Age - Both Questions Right + 0 1C: Blink Eyes & Squeeze Hands - Performs Both Tasks + 0 2: Test Horizontal Extraocular Movements - Normal + 0 3: Test Visual Fields - No Visual Loss + 0 4: Test Facial Palsy (Use Grimace if Obtunded) - Normal symmetry + 0 5A: Test Left Arm Motor Drift - No Drift for 10 Seconds + 0 5B: Test Right Arm Motor Drift - No Drift for 10 Seconds + 0 6A: Test Left Leg Motor Drift - No Drift for 5 Seconds + 0 6B: Test Right Leg Motor Drift - No Drift for 5 Seconds + 0 7: Test Limb Ataxia (FNF/Heel-Shin) - No Ataxia +  0 8: Test Sensation - Normal; No sensory loss + 0 9: Test Language/Aphasia - Normal; No aphasia + 0 10: Test Dysarthria - Normal + 0 11: Test Extinction/Inattention - No abnormality + 0  NIHSS Score: 0  Patient/Family was informed the Neurology Consult would occur via TeleHealth consult by way of interactive audio and video telecommunications and consented to receiving care in this manner.  Due to the immediate potential for life-threatening deterioration due to underlying acute neurologic illness,  I spent 35 minutes providing critical care. This time includes time for face to face visit via telemedicine, review of medical records, imaging studies and discussion of findings with providers, the patient and/or family.   Dr Lenard Galloway Florencio Hollibaugh   [facility] [phone]  Case [id number]

## 2019-03-21 ENCOUNTER — Observation Stay (HOSPITAL_COMMUNITY): Payer: Self-pay

## 2019-03-21 ENCOUNTER — Observation Stay (HOSPITAL_BASED_OUTPATIENT_CLINIC_OR_DEPARTMENT_OTHER): Payer: Self-pay

## 2019-03-21 DIAGNOSIS — I6389 Other cerebral infarction: Secondary | ICD-10-CM

## 2019-03-21 DIAGNOSIS — E1165 Type 2 diabetes mellitus with hyperglycemia: Secondary | ICD-10-CM

## 2019-03-21 LAB — BASIC METABOLIC PANEL
Anion gap: 8 (ref 5–15)
BUN: 28 mg/dL — ABNORMAL HIGH (ref 8–23)
CO2: 18 mmol/L — ABNORMAL LOW (ref 22–32)
Calcium: 8.8 mg/dL — ABNORMAL LOW (ref 8.9–10.3)
Chloride: 110 mmol/L (ref 98–111)
Creatinine, Ser: 1.05 mg/dL (ref 0.61–1.24)
GFR calc Af Amer: >60 mL/min (ref >60)
GFR calc non Af Amer: >60 mL/min (ref >60)
Glucose, Bld: 139 mg/dL — ABNORMAL HIGH (ref 70–99)
Potassium: 3.8 mmol/L (ref 3.5–5.1)
Sodium: 136 mmol/L (ref 135–145)

## 2019-03-21 LAB — GLUCOSE, CAPILLARY
Glucose-Capillary: 159 mg/dL — ABNORMAL HIGH (ref 70–99)
Glucose-Capillary: 229 mg/dL — ABNORMAL HIGH (ref 70–99)

## 2019-03-21 LAB — MAGNESIUM: Magnesium: 2 mg/dL (ref 1.7–2.4)

## 2019-03-21 LAB — ECHOCARDIOGRAM COMPLETE
Height: 72 in
Weight: 2174.4 oz

## 2019-03-21 LAB — LIPID PANEL
Cholesterol: 168 mg/dL (ref 0–200)
HDL: 54 mg/dL (ref >40)
LDL Cholesterol: 93 mg/dL (ref 0–99)
Total CHOL/HDL Ratio: 3.1 RATIO
Triglycerides: 106 mg/dL (ref <150)
VLDL: 21 mg/dL (ref 0–40)

## 2019-03-21 LAB — SARS CORONAVIRUS 2 (TAT 6-24 HRS): SARS Coronavirus 2: NEGATIVE

## 2019-03-21 LAB — HIV ANTIBODY (ROUTINE TESTING W REFLEX): HIV Screen 4th Generation wRfx: NONREACTIVE

## 2019-03-21 LAB — TSH: TSH: 1.395 u[IU]/mL (ref 0.350–4.500)

## 2019-03-21 IMAGING — US US CAROTID DUPLEX BILAT
1 series · 13 of 24 positions shown · non-contrast
Comparison: None.

CLINICAL DATA: Stroke.  Dizziness for the past week.

EXAM:
BILATERAL CAROTID DUPLEX ULTRASOUND
TECHNIQUE: Gray scale imaging, color Doppler and duplex ultrasound were
performed of bilateral carotid and vertebral arteries in the neck.

[Series 1: us carotid duplex bilat · 0.06mm/px · 13 of 68 slices shown]
[im 1/68]
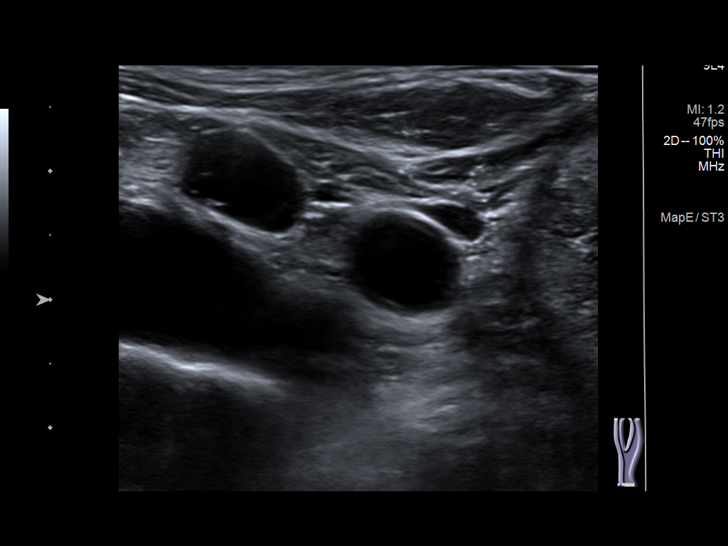
[im 6/68]
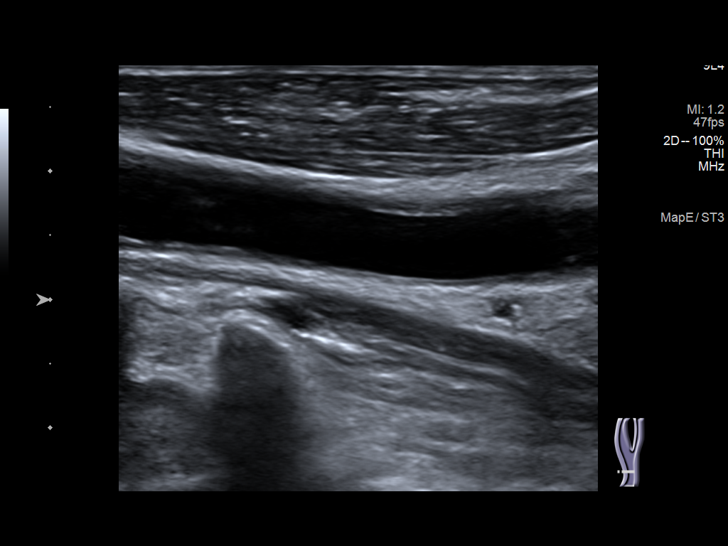
[im 12/68]
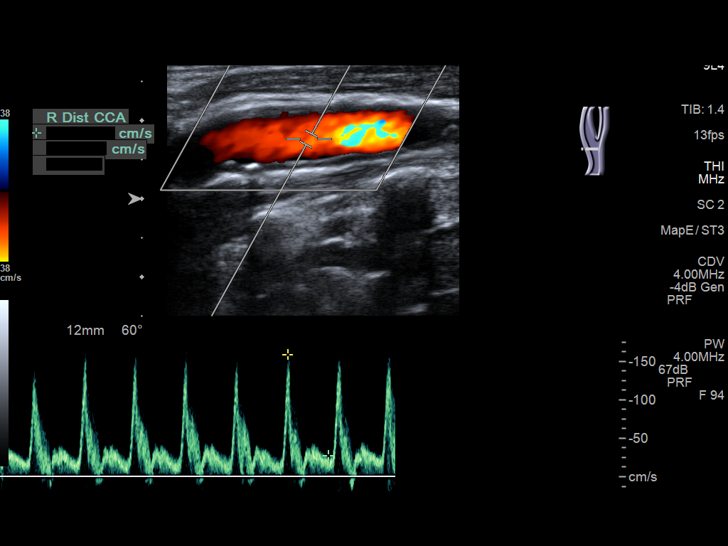
[im 18/68]
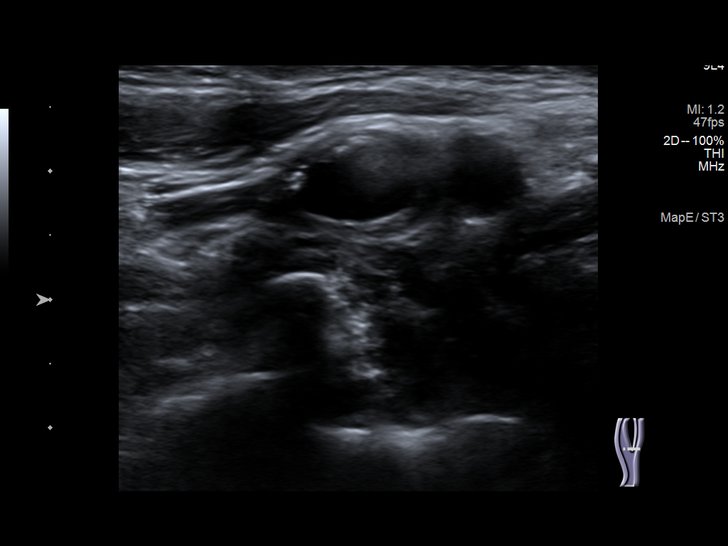
[im 24/68]
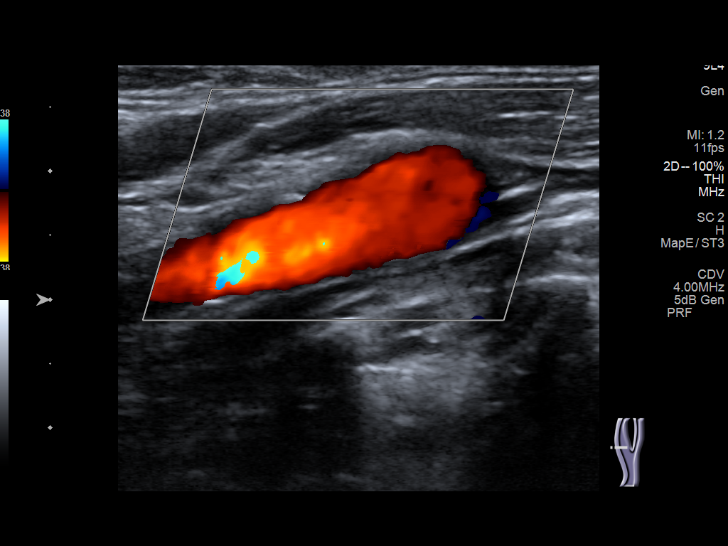
[im 30/68]
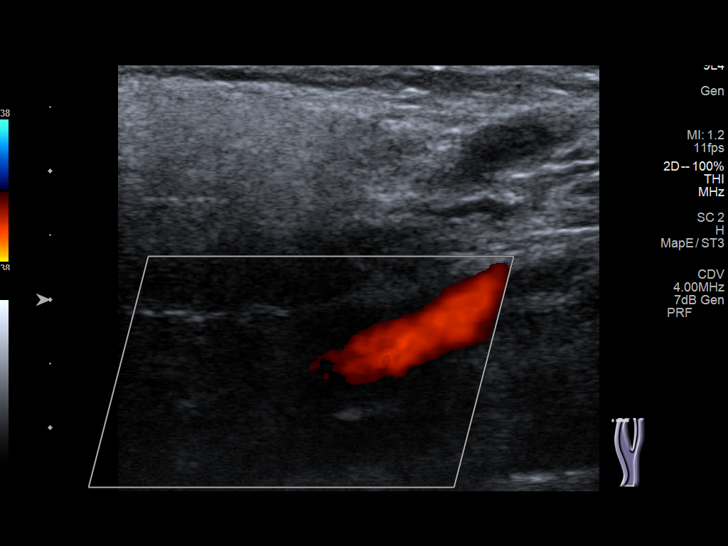
[im 35/68]
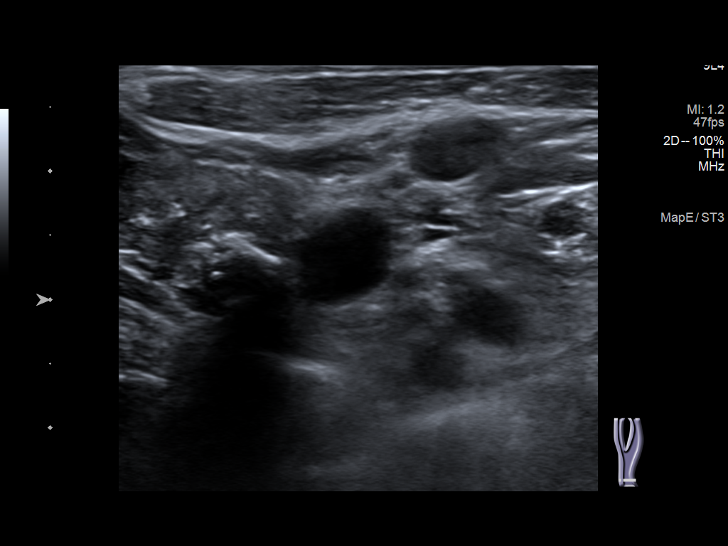
[im 38/68]
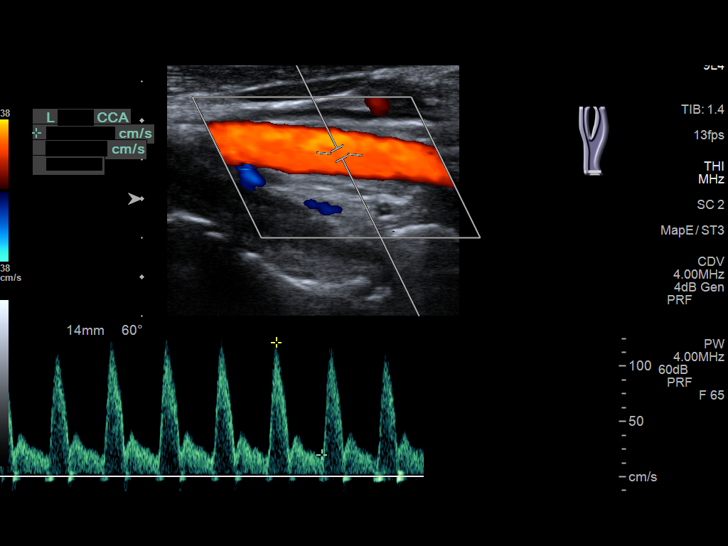
[im 44/68]
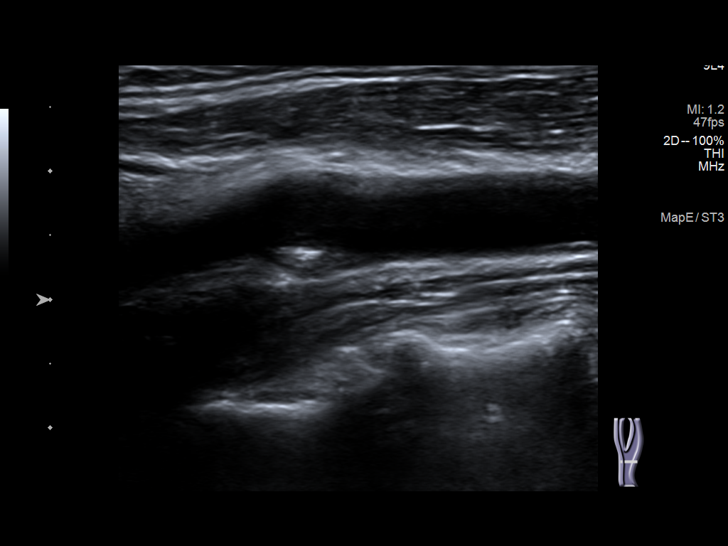
[im 50/68]
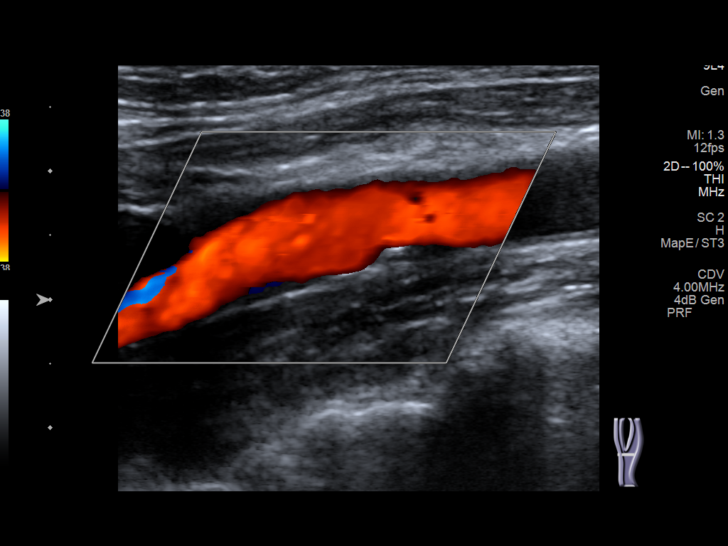
[im 56/68]
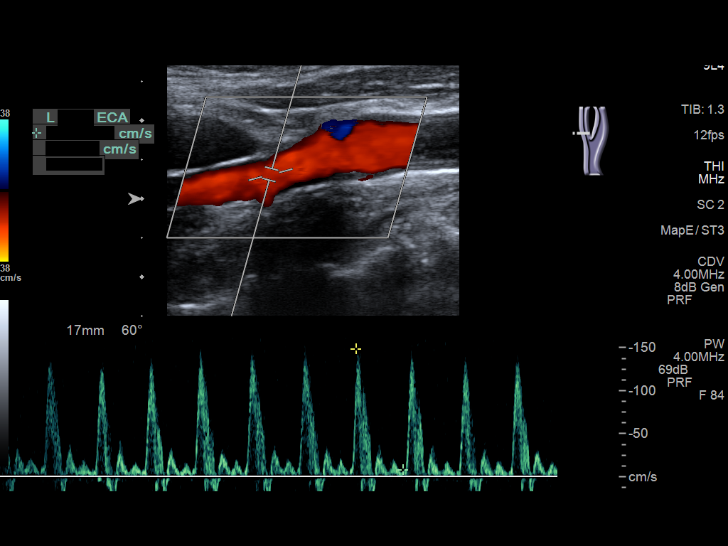
[im 62/68]
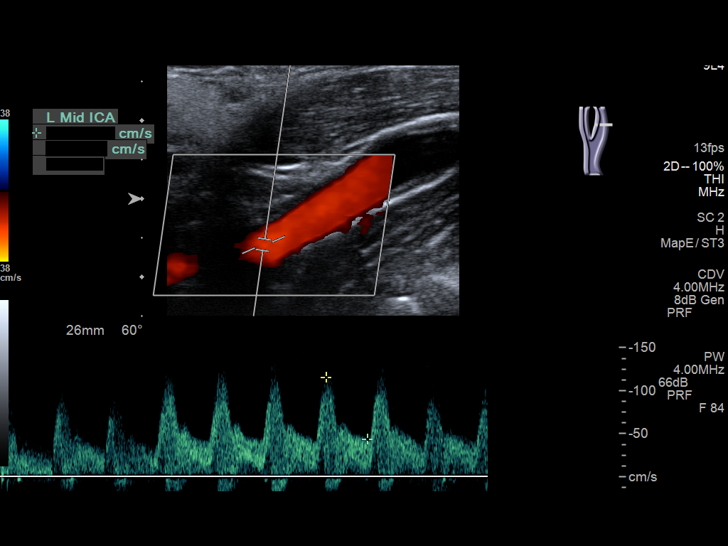
[im 68/68]
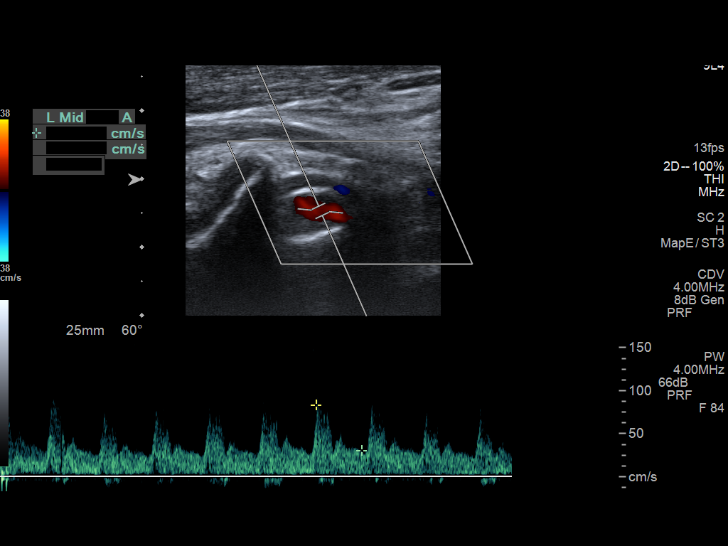

[13 of 24 positions shown; findings below may reference images not displayed]

FINDINGS: Criteria: Quantification of carotid stenosis is based on velocity
parameters that correlate the residual internal carotid diameter
with NASCET-based stenosis levels, using the diameter of the distal
internal carotid lumen as the denominator for stenosis measurement.

The following velocity measurements were obtained:

RIGHT

ICA: 188/43 cm/sec

CCA: 157/32 cm/sec

SYSTOLIC ICA/CCA RATIO:

ECA: 118 cm/sec

LEFT

ICA: 138/31 cm/sec

CCA: 124/36 cm/sec

SYSTOLIC ICA/CCA RATIO:

ECA: 148 cm/sec

RIGHT CAROTID ARTERY: There is a minimal amount of eccentric
echogenic plaque involving the mid (image 6) aspects of the right
common carotid artery. There is a minimal amount of intimal
thickening/atherosclerotic plaque within the right carotid bulb
(images 14 and 16), extending to involve the origin and proximal
aspects of the right internal carotid artery, not definitely
resulting in a hemodynamically significant stenosis within the right
internal carotid artery with preservation of the systolic ICA/CCA
ratio.

RIGHT VERTEBRAL ARTERY:  Antegrade flow

LEFT CAROTID ARTERY: There is a minimal amount of eccentric
echogenic plaque within the left carotid bulb (images 48 and 50),
extending to involve the origin and proximal aspects of the left
internal carotid artery (image 58), not definitely resulting in
hemodynamically significant stenosis within the left internal
carotid artery with preservation of the systolic ICA/CCA ratio.

LEFT VERTEBRAL ARTERY:  Antegrade flow
IMPRESSION: Minimal amount of bilateral atherosclerotic plaque, not definitely
resulting in a hemodynamically significant stenosis within either
internal carotid artery with preservation of the systolic ICA/CCA
ratios bilaterally.

## 2019-03-21 IMAGING — MR MR CERVICAL SPINE W/O CM
4 of 5 series · 22 of 48 positions shown · non-contrast
Comparison: No pertinent prior studies available for comparison.

CLINICAL DATA: Transient ischemic attack (TIA). Additional history
provided by technologist: Headache, neck pain and bilateral arm
weakness for 3 days.

EXAM:
MRI CERVICAL SPINE WITHOUT CONTRAST
TECHNIQUE: Multiplanar, multisequence MR imaging of the cervical spine was
performed. No intravenous contrast was administered.

[Series 3: T2 · sagittal · 3.0mm · 0.69mm/px · 7 of 15 slices shown (1 of 2)]
[im 1/15]
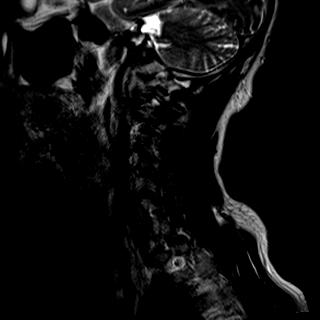
[im 3/15]
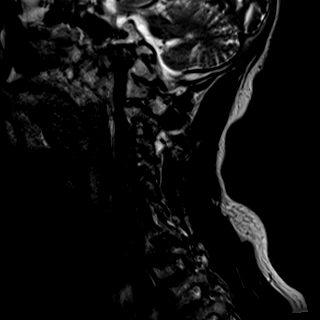
[im 5/15]
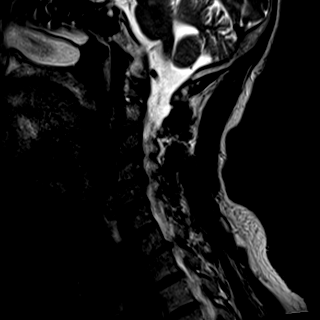
[im 8/15]
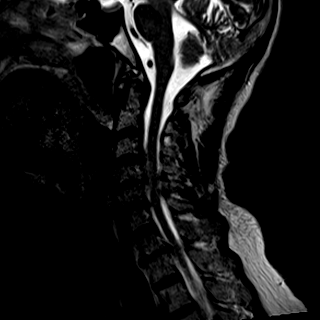
[im 10/15]
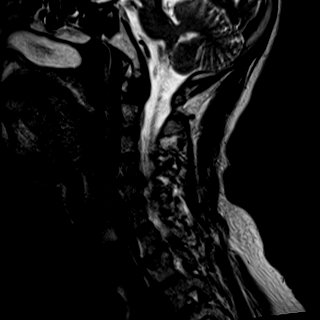
[im 12/15]
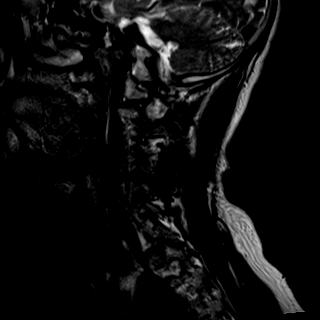
[im 15/15]
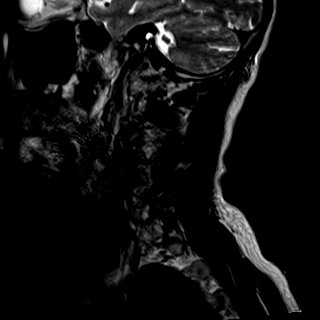

[Series 4: FLAIR · sagittal · 3.0mm · 0.69mm/px · 3 of 15 slices shown]
[im 3/15]
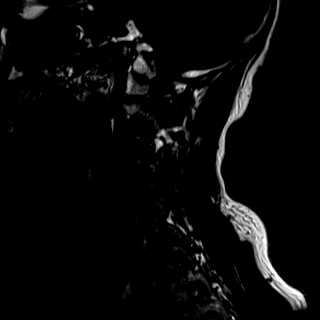
[im 8/15]
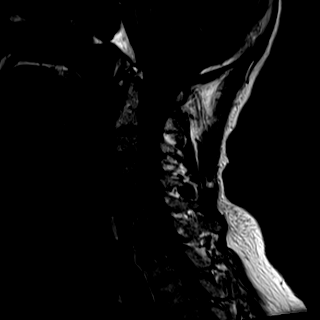
[im 12/15]
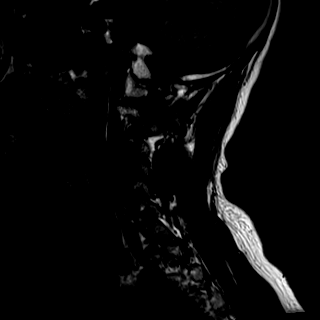

[Series 5: STIR · sagittal · 3.0mm · 0.34mm/px · 4 of 15 slices shown]
[im 1/15]
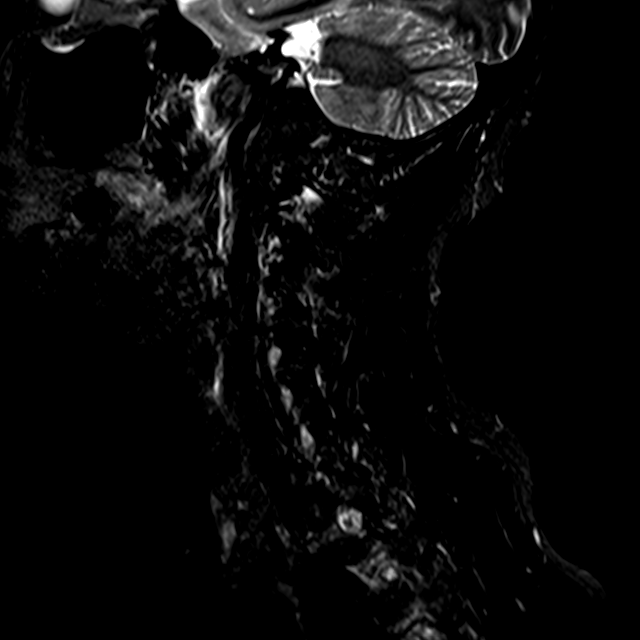
[im 3/15]
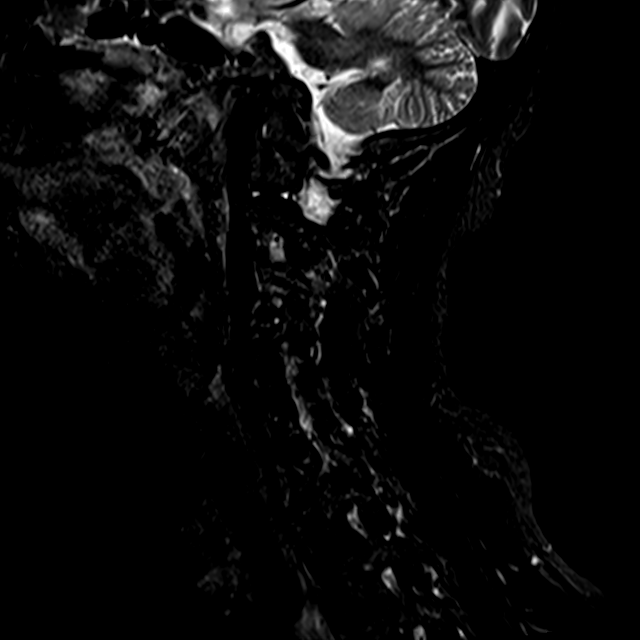
[im 9/15]
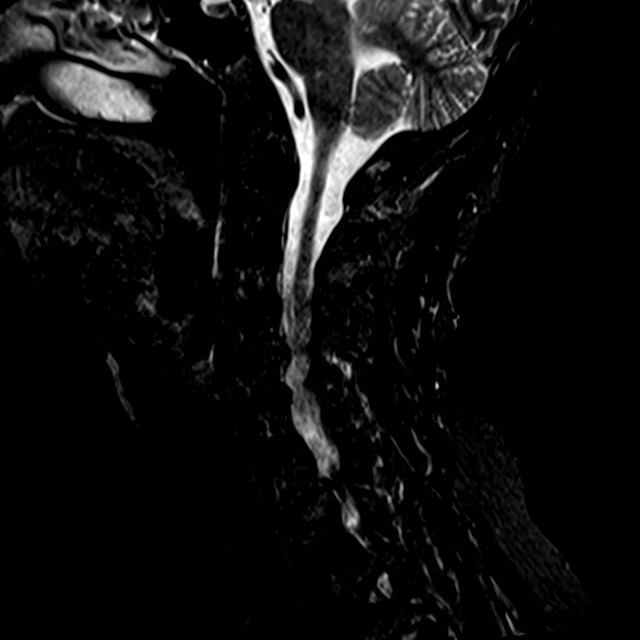
[im 15/15]
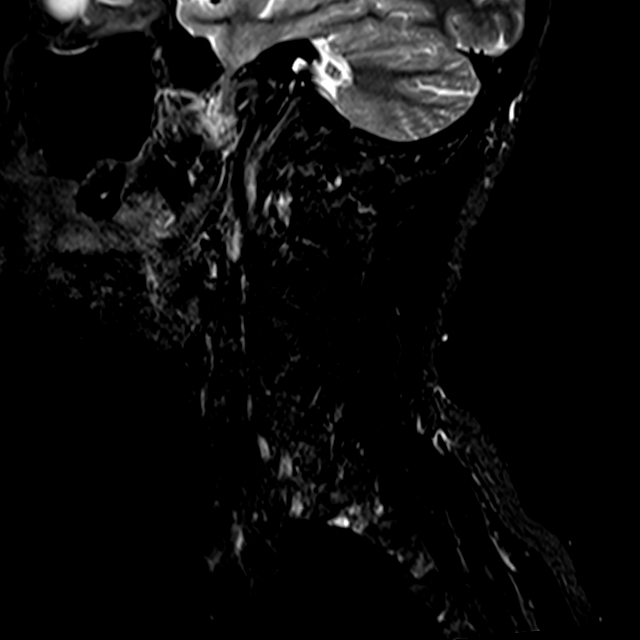

[Series 6: T2 · axial · 3.0mm · 0.29mm/px · z∈[-225,-117]mm · 8 of 34 slices shown (2 of 2)]
[im 1/34]
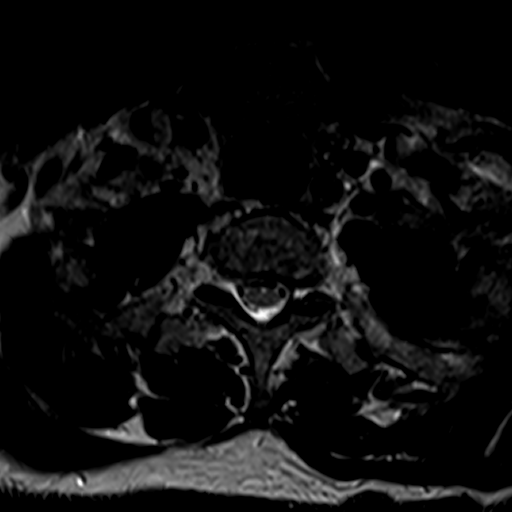
[im 6/34]
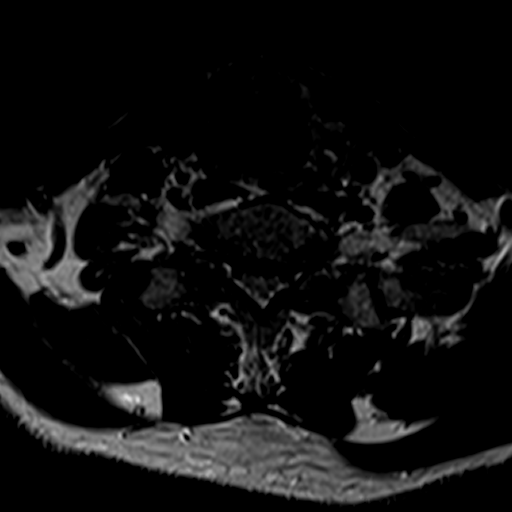
[im 11/34]
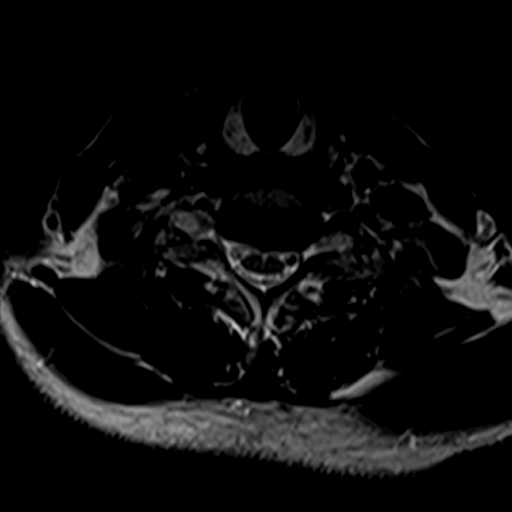
[im 16/34]
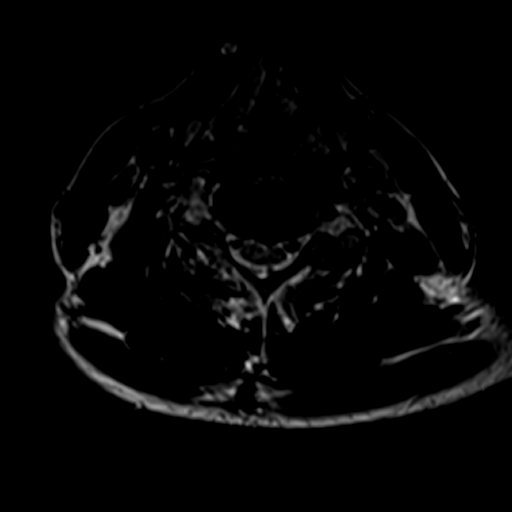
[im 18/34]
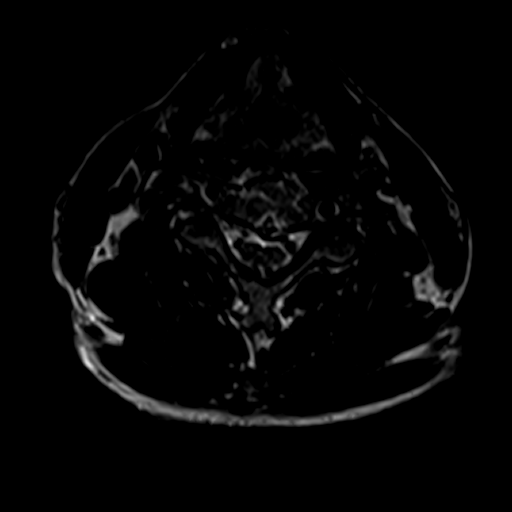
[im 23/34]
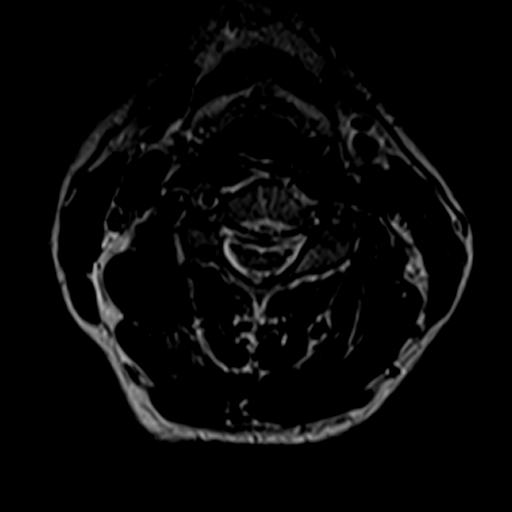
[im 28/34]
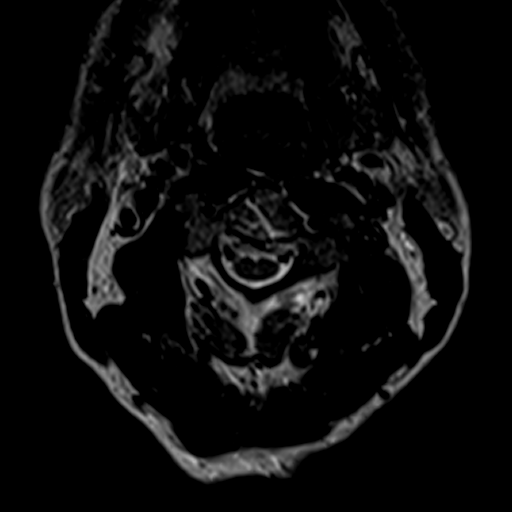
[im 34/34]
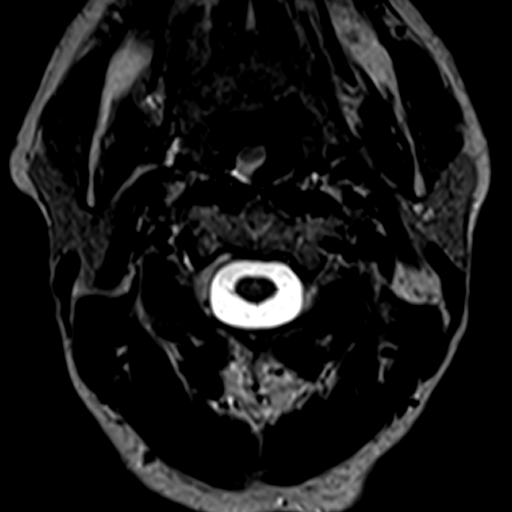

[22 of 48 positions shown; findings below may reference images not displayed]

FINDINGS: The examination is intermittently motion degraded with up to
moderate motion degradation of the acquired sequences.

Alignment: Trace C4-C5 retrolisthesis.

Vertebrae: Vertebral body height is maintained. Trace degenerative
endplate marrow edema at C4-C5. No suspicious osseous lesion.
Multilevel degenerative endplate irregularity greatest at C4-C5.

Cord: No spinal cord signal abnormality.

Posterior Fossa, vertebral arteries, paraspinal tissues: Please
refer to concurrently performed brain MRI for description of
posterior fossa findings. Flow voids preserved within the imaged
cervical vertebral arteries. Paraspinal soft tissues within normal
limits.

Disc levels:

Disc degeneration is moderate/severe at C4-C5 and C7-T1. Only mild
disc degeneration at the remaining levels.

C2-C3: Mild facet hypertrophy. No significant spinal canal or neural
foraminal narrowing.

C3-C4: Central disc protrusion. Facet hypertrophy with mild uncinate
hypertrophy. Mild relative spinal canal narrowing. Mild/moderate
left neural foraminal narrowing.

C4-C5: Posterior disc osteophyte complex. Uncinate, facet and
ligamentum flavum hypertrophy. Moderate spinal canal stenosis with
contact upon the ventral and dorsal spinal cord. Moderate bilateral
neural foraminal narrowing (greater on the right).

C5-C6: Disc bulge with superimposed central disc protrusion.
Uncinate/facet hypertrophy. Mild spinal canal stenosis with contact
upon the ventral spinal cord. Moderate bilateral neural foraminal
narrowing.

C6-C7: Tiny central disc protrusion. No significant spinal canal or
neural foraminal narrowing.

C7-T1: Disc bulge with superimposed central disc protrusion.
Bilateral disc osteophyte ridge. Facet/ligamentum flavum
hypertrophy. Moderate spinal canal stenosis. The disc protrusion
contacts and slightly flattens the ventral spinal cord. Severe
bilateral neural foraminal narrowing.
IMPRESSION: Cervical spondylosis as outlined and most notably as follows.

At C4-C5, multifactorial moderate spinal canal stenosis with contact
upon the ventral and dorsal spinal cord. Moderate bilateral neural
foraminal narrowing (greater on the right).

At C5-C6, multifactorial mild spinal canal stenosis with contact
upon the ventral spinal cord. Moderate bilateral neural foraminal
narrowing.

At C7-T1, multifactorial moderate spinal canal stenosis. A disc
protrusion contacts and slightly flattens the ventral spinal cord.
Severe bilateral neural foraminal narrowing.

## 2019-03-21 IMAGING — MR MR HEAD W/O CM
8 of 10 series · 35 of 48 positions shown · non-contrast
Comparison: None.

CLINICAL DATA: Headache, neck pain, extremity weakness

EXAM:
MRI HEAD WITHOUT CONTRAST
TECHNIQUE: Multiplanar, multiecho pulse sequences of the brain and surrounding
structures were obtained without intravenous contrast.

[Series 2: T1 · sagittal · 5.0mm · 0.43mm/px · 2 of 20 slices shown (1 of 2)]
[im 1/20]
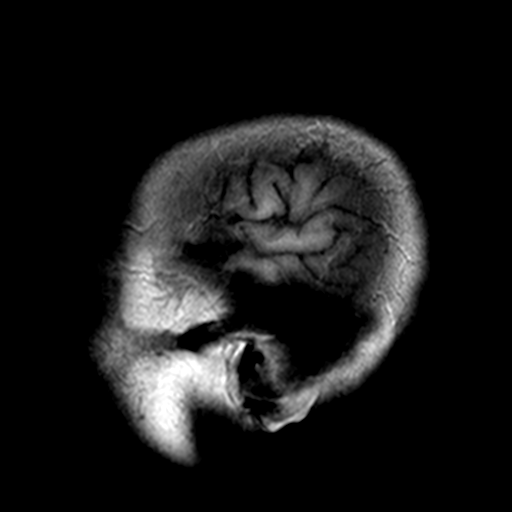
[im 20/20]
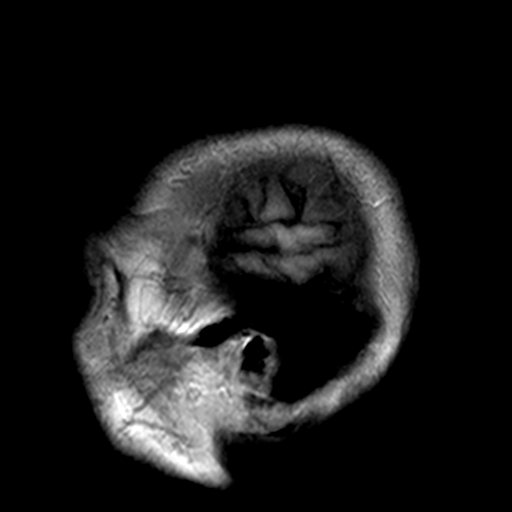

[Series 3: DWI · axial · 3.0mm · 0.79mm/px · z∈[-77,+84]mm · 7 of 55 slices shown (1 of 2)]
[im 1/55]
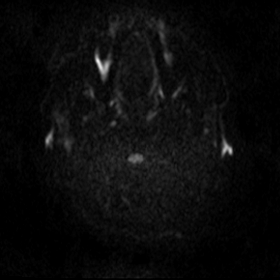
[im 10/55]
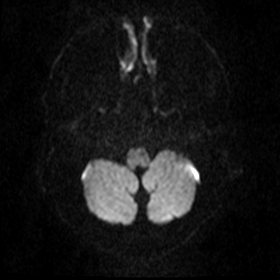
[im 19/55]
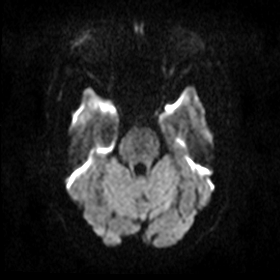
[im 28/55]
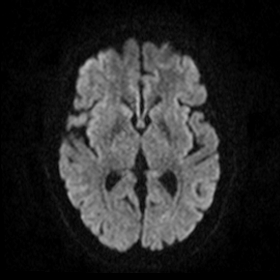
[im 37/55]
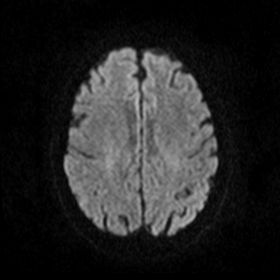
[im 46/55]
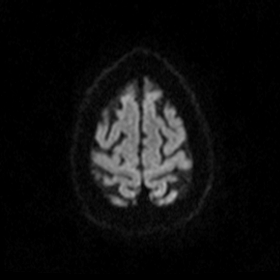
[im 55/55]
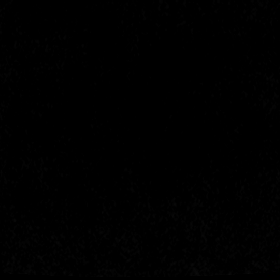

[Series 5: DWI · coronal · 5.0mm · 0.50mm/px · 4 of 34 slices shown (2 of 2)]
[im 1/34]
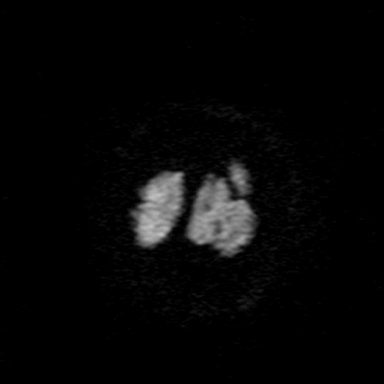
[im 12/34]
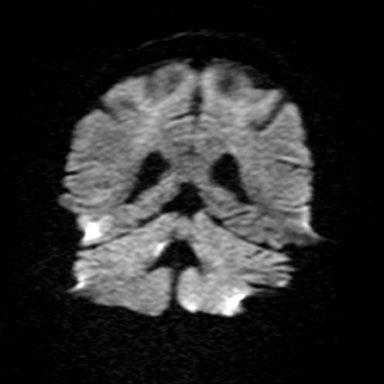
[im 23/34]
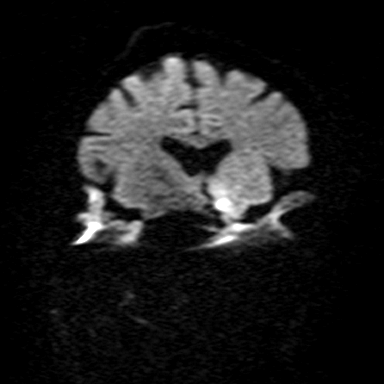
[im 34/34]
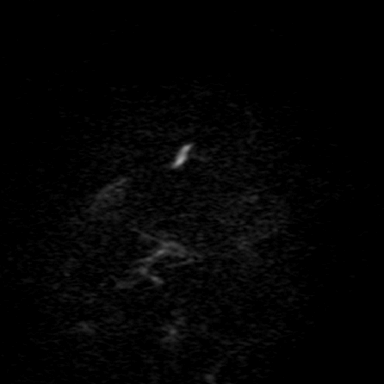

[Series 7: T2 · axial · 5.0mm · 0.67mm/px · z∈[-68,+74]mm · 3 of 23 slices shown (1 of 3)]
[im 1/23]
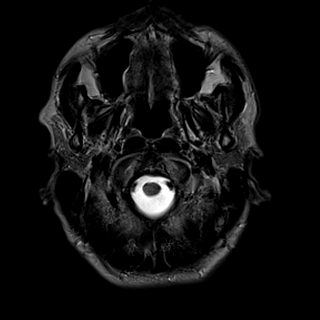
[im 12/23]
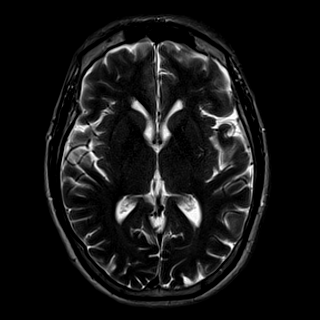
[im 23/23]
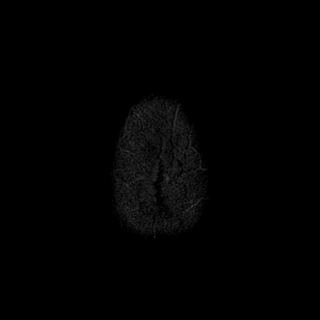

[Series 8: T2 · axial · 5.0mm · 0.42mm/px · z∈[-61,+68]mm · 3 of 21 slices shown (2 of 3)]
[im 1/21]
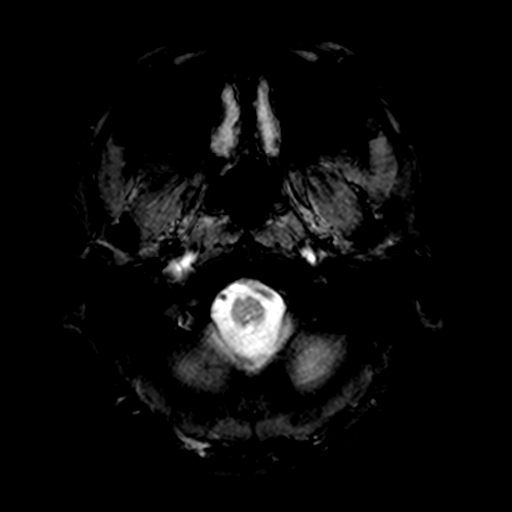
[im 11/21]
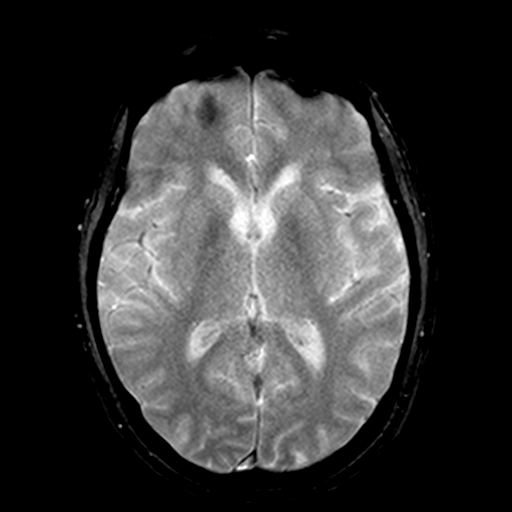
[im 21/21]
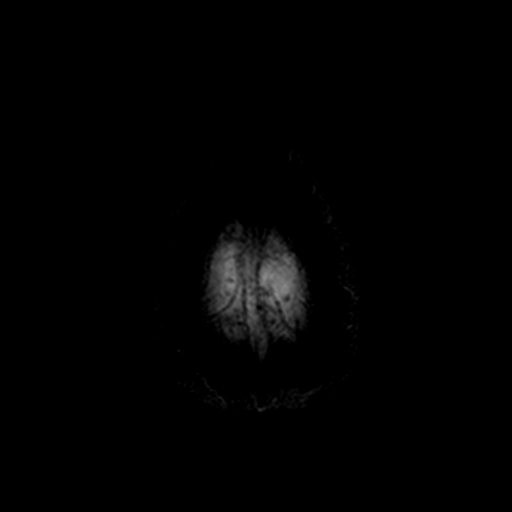

[Series 9: FLAIR · axial · 3.0mm · 0.82mm/px · z∈[-66,+72]mm · 6 of 47 slices shown]
[im 1/47]
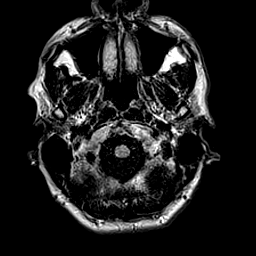
[im 10/47]
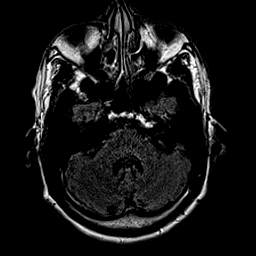
[im 19/47]
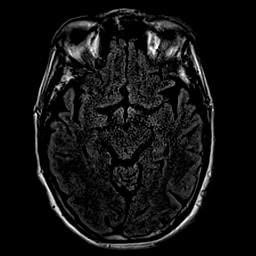
[im 28/47]
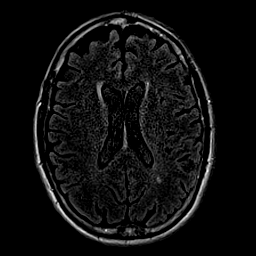
[im 37/47]
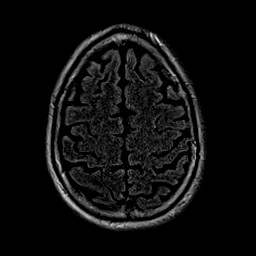
[im 47/47]
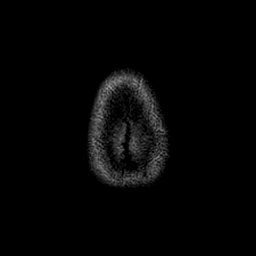

[Series 10: T1 · axial · 2.0mm · 0.41mm/px · z∈[-67,+55]mm · 7 of 71 slices shown (2 of 2)]
[im 1/71]
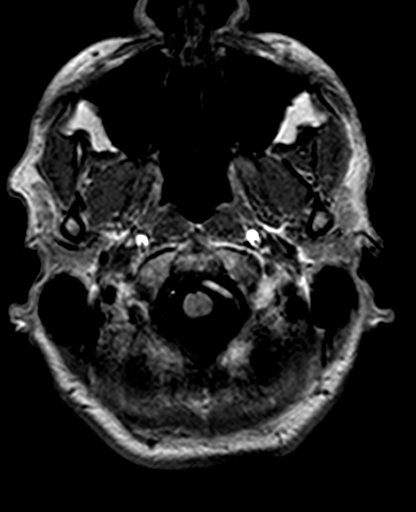
[im 9/71]
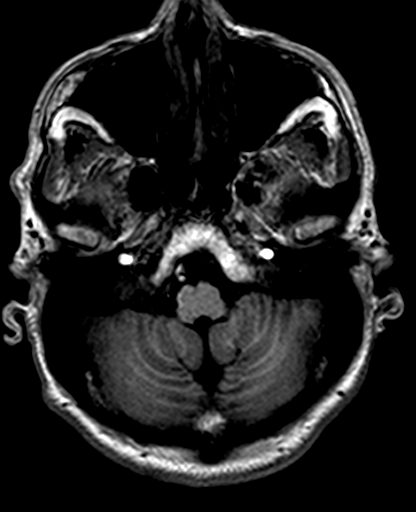
[im 18/71]
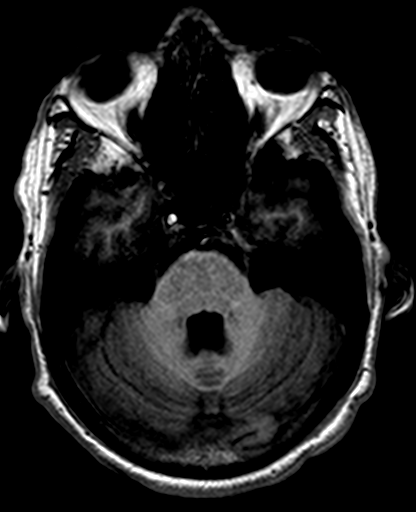
[im 27/71]
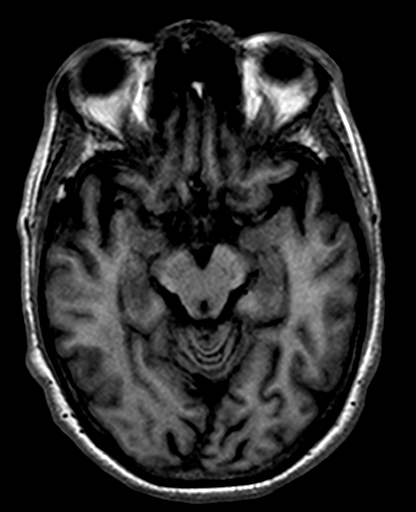
[im 44/71]
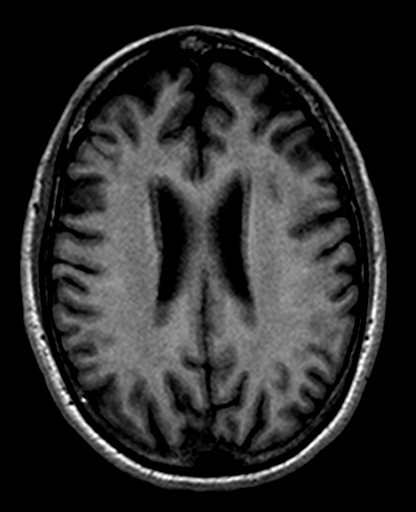
[im 53/71]
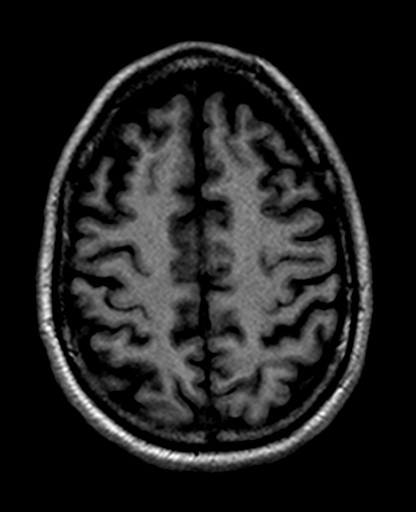
[im 62/71]
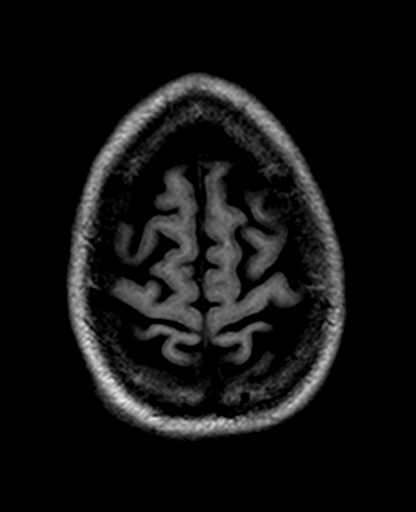

[Series 11: T2 · coronal · 5.0mm · 0.59mm/px · 3 of 28 slices shown (3 of 3)]
[im 1/28]
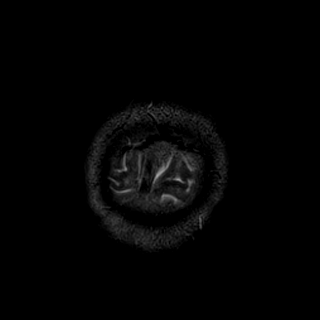
[im 14/28]
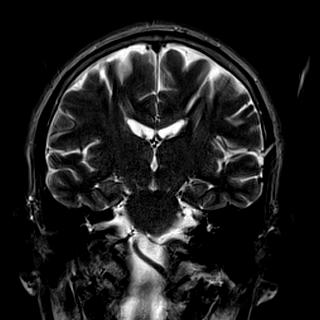
[im 28/28]
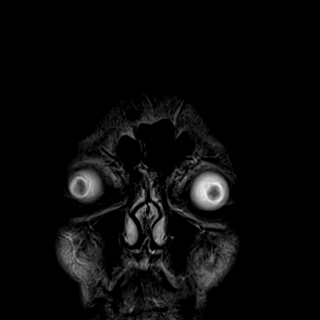

[35 of 48 positions shown; findings below may reference images not displayed]

FINDINGS: Brain: There is no acute infarction or intracranial hemorrhage.
There is no intracranial mass, mass effect, or edema. There is no
hydrocephalus or extra-axial fluid collection. Minimal foci of T2
hyperintensity in the supratentorial white matter are nonspecific
but may reflect minor chronic microvascular ischemic changes.

Vascular: Major vessel flow voids at the skull base are preserved.

Skull and upper cervical spine: Normal marrow signal is preserved.

Sinuses/Orbits: Minor mucosal thickening.  Orbits are unremarkable.

Other: Sella is unremarkable.  Mastoid air cells are clear.
IMPRESSION: No evidence of recent infarction, intracranial hemorrhage, or mass.

## 2019-03-21 MED ORDER — ATORVASTATIN CALCIUM 20 MG PO TABS: 20.0000 mg | ORAL_TABLET | Freq: Every day | ORAL | Status: DC

## 2019-03-21 MED ORDER — GLIPIZIDE 5 MG PO TABS: 5.0000 mg | ORAL_TABLET | Freq: Two times a day (BID) | ORAL | 1 refills | Status: DC

## 2019-03-21 MED ORDER — LIVING WELL WITH DIABETES BOOK: Freq: Once | Status: AC

## 2019-03-21 MED ORDER — METFORMIN HCL 850 MG PO TABS: 850.0000 mg | ORAL_TABLET | Freq: Two times a day (BID) | ORAL | 1 refills | Status: DC

## 2019-03-21 MED ORDER — ATORVASTATIN CALCIUM 20 MG PO TABS: 20.0000 mg | ORAL_TABLET | Freq: Every day | ORAL | 1 refills | Status: DC

## 2019-03-21 MED ORDER — METOPROLOL TARTRATE 25 MG PO TABS: 12.5000 mg | ORAL_TABLET | Freq: Two times a day (BID) | ORAL | 1 refills | Status: DC

## 2019-03-21 MED ORDER — METOPROLOL TARTRATE 25 MG PO TABS
12.5000 mg | ORAL_TABLET | Freq: Two times a day (BID) | ORAL | Status: DC
  Administered 2019-03-21: 12.5 mg via ORAL
  Filled 2019-03-21: qty 1

## 2019-03-21 MED ORDER — ASPIRIN 325 MG PO TABS: 325.0000 mg | ORAL_TABLET | Freq: Every day | ORAL | Status: DC

## 2019-03-21 NOTE — Discharge Summary (Signed)
Physician Discharge Summary  Alec Bruce [id number] DOB: 1955/08/23 DOA: 03/20/2019  PCP: Patient, No Pcp Per  Admit date: 03/20/2019 Discharge date: 03/21/2019  Admitted From: Home Disposition:  Home   Recommendations for Outpatient Follow-up:  1. Follow up with PCP in 1-2 weeks 2. Please obtain BMP/CBC in one week 3. Follow up with [facility] on 03/23/19 at 10AM    Discharge Condition: Stable CODE STATUS:FULL Diet recommendation: Heart Healthy / Carb Modified   Brief/Interim Summary: Alec Bruce is a 64 y.o. male with medical history of remote colon cancer, diabetes mellitus presenting with left hand weakness that began around 10:45 AM on 03/20/2019.  The patient states that he was chopping wood earlier in the day outside without any difficulty or symptoms.  He finished chopping wood around 10:30 AM.  He denied any other symptoms prior to his left hand weakness onset.  He denied any dysarthria, facial droop, dysarthria, visual disturbance, other focal extremity weakness, or dysesthesia.  He denies any fevers, chills, chest pain, coughing, hemoptysis, headache.  He denies any new medications except for Metformin which she was started 2 weeks prior to this admission.  The patient states that he has not had PCP follow-up for many years.  He visited emergency department on 03/06/2019 secondary to injury of his right hand and was started on Metformin at that time. Upon arrival to the emergency department, the patient's symptoms of his left hand weakness was still present, but completely resolved by the time of EDP evaluation.  The patient was afebrile hemodynamically stable saturating 100% room air.  BMP, LFTs, and CBC were unremarkable.  CT of the brain was negative.  Urine drug screen was negative.  EKG shows sinus rhythm with nonspecific T wave changes.  The patient was admitted for work-up of TIA.  Discharge Diagnoses:  Left hand weakness/TIA -Neurology Consult-->continue  ASA -PT/OT evaluation -Speech therapy eval -CT brain--neg -MRI brain--neg -Carotid Duplex--no hemodynamically significant stenosis -Echo--EF 50-55%, no WMA, no PFO -LDL--93 -HbA1C--13.7 -Antiplatelet--ASA 325 mg  Diabetes mellitus type 2, uncontrolled with hyperglycemia -Holding Metformin>>restart higher dose at d/c -Hemoglobin A1c--13.7 -do not plan to start [place] insulin at time of dc due to pt's financial constraints, apprehension about administering insulin, and poor insight -add glipizide -pt already has glucometer, strips, lancets   Discharge Instructions  Discharge Instructions    Ambulatory referral to Physical Therapy   Complete by: As directed      Allergies as of 03/21/2019   Not on File     Medication List    TAKE these medications   aspirin 325 MG tablet Take 1 tablet (325 mg total) by mouth daily. Start taking on: March 22, 2019   atorvastatin 20 MG tablet Commonly known as: LIPITOR Take 1 tablet (20 mg total) by mouth daily at 6 PM.   glipiZIDE 5 MG tablet Commonly known as: GLUCOTROL Take 1 tablet (5 mg total) by mouth 2 (two) times daily before a meal.   metFORMIN 850 MG tablet Commonly known as: GLUCOPHAGE Take 1 tablet (850 mg total) by mouth 2 (two) times daily with a meal. What changed:   medication strength  how much to take   metoprolol tartrate 25 MG tablet Commonly known as: LOPRESSOR Take 0.5 tablets (12.5 mg total) by mouth 2 (two) times daily.   ofloxacin 0.3 % ophthalmic solution Commonly known as: OCUFLOX INSTILL 1 DROP IN LEFT EYE FOUR TIMES DAILY FOR 1 WEEK THEN STOP   prednisoLONE acetate 1 % ophthalmic suspension Commonly known  as: PRED FORTE      Follow-up Information    [facility] Follow up.   Why: You are scheduled for an appointment with [facility] on Thursday, February, 18th at 10:00am. Contact information: [address] [place], [place] [address] [phone]         Not on  File  Consultations:  none   Procedures/Studies: DG Chest 2 View  Result Date: 03/20/2019 CLINICAL DATA:  Left hand numbness.  Possible stroke today. EXAM: CHEST - 2 VIEW COMPARISON:  None. FINDINGS: The chest is hyperexpanded with some attenuation of the pulmonary vasculature. Lungs are clear. Heart size is normal. No pneumothorax or pleural effusion. No acute or focal bony abnormality. IMPRESSION: No acute disease. The lungs appear emphysematous. Electronically Signed   By: Inge Rise M.D.   On: 03/20/2019 19:38   MR BRAIN WO CONTRAST  Result Date: 03/21/2019 CLINICAL DATA:  Headache, neck pain, extremity weakness EXAM: MRI HEAD WITHOUT CONTRAST TECHNIQUE: Multiplanar, multiecho pulse sequences of the brain and surrounding structures were obtained without intravenous contrast. COMPARISON:  None. FINDINGS: Brain: There is no acute infarction or intracranial hemorrhage. There is no intracranial mass, mass effect, or edema. There is no hydrocephalus or extra-axial fluid collection. Minimal foci of T2 hyperintensity in the supratentorial white matter are nonspecific but may reflect minor chronic microvascular ischemic changes. Vascular: Major vessel flow voids at the skull base are preserved. Skull and upper cervical spine: Normal marrow signal is preserved. Sinuses/Orbits: Minor mucosal thickening.  Orbits are unremarkable. Other: Sella is unremarkable.  Mastoid air cells are clear. IMPRESSION: No evidence of recent infarction, intracranial hemorrhage, or mass. Electronically Signed   By: Macy Mis M.D.   On: 03/21/2019 08:59   MR CERVICAL SPINE WO CONTRAST  Result Date: 03/21/2019 CLINICAL DATA:  Transient ischemic attack (TIA). Additional history provided by technologist: Headache, neck pain and bilateral arm weakness for 3 days. EXAM: MRI CERVICAL SPINE WITHOUT CONTRAST TECHNIQUE: Multiplanar, multisequence MR imaging of the cervical spine was performed. No intravenous contrast was  administered. COMPARISON:  No pertinent prior studies available for comparison. FINDINGS: The examination is intermittently motion degraded with up to moderate motion degradation of the acquired sequences. Alignment: Trace C4-C5 retrolisthesis. Vertebrae: Vertebral body height is maintained. Trace degenerative endplate marrow edema at C4-C5. No suspicious osseous lesion. Multilevel degenerative endplate irregularity greatest at C4-C5. Cord: No spinal cord signal abnormality. Posterior Fossa, vertebral arteries, paraspinal tissues: Please refer to concurrently performed brain MRI for description of posterior fossa findings. Flow voids preserved within the imaged cervical vertebral arteries. Paraspinal soft tissues within normal limits. Disc levels: Disc degeneration is moderate/severe at C4-C5 and C7-T1. Only mild disc degeneration at the remaining levels. C2-C3: Mild facet hypertrophy. No significant spinal canal or neural foraminal narrowing. C3-C4: Central disc protrusion. Facet hypertrophy with mild uncinate hypertrophy. Mild relative spinal canal narrowing. Mild/moderate left neural foraminal narrowing. C4-C5: Posterior disc osteophyte complex. Uncinate, facet and ligamentum flavum hypertrophy. Moderate spinal canal stenosis with contact upon the ventral and dorsal spinal cord. Moderate bilateral neural foraminal narrowing (greater on the right). C5-C6: Disc bulge with superimposed central disc protrusion. Uncinate/facet hypertrophy. Mild spinal canal stenosis with contact upon the ventral spinal cord. Moderate bilateral neural foraminal narrowing. C6-C7: Tiny central disc protrusion. No significant spinal canal or neural foraminal narrowing. C7-T1: Disc bulge with superimposed central disc protrusion. Bilateral disc osteophyte ridge. Facet/ligamentum flavum hypertrophy. Moderate spinal canal stenosis. The disc protrusion contacts and slightly flattens the ventral spinal cord. Severe bilateral neural foraminal  narrowing. IMPRESSION: Cervical spondylosis as outlined and most notably as follows. At C4-C5, multifactorial moderate spinal canal stenosis with contact upon the ventral and dorsal spinal cord. Moderate bilateral neural foraminal narrowing (greater on the right). At C5-C6, multifactorial mild spinal canal stenosis with contact upon the ventral spinal cord. Moderate bilateral neural foraminal narrowing. At C7-T1, multifactorial moderate spinal canal stenosis. A disc protrusion contacts and slightly flattens the ventral spinal cord. Severe bilateral neural foraminal narrowing. Electronically Signed   By: Kellie Simmering DO   On: 03/21/2019 09:06   US Carotid Bilateral (at [facility] and [facility] only)  Result Date: 03/21/2019 CLINICAL DATA:  Stroke.  Dizziness for the past week. EXAM: BILATERAL CAROTID DUPLEX ULTRASOUND TECHNIQUE: Pearline Cables scale imaging, color Doppler and duplex ultrasound were performed of bilateral carotid and vertebral arteries in the neck. COMPARISON:  None. FINDINGS: Criteria: Quantification of carotid stenosis is based on velocity parameters that correlate the residual internal carotid diameter with NASCET-based stenosis levels, using the diameter of the distal internal carotid lumen as the denominator for stenosis measurement. The following velocity measurements were obtained: RIGHT ICA: 188/43 cm/sec CCA: [id number] cm/sec SYSTOLIC ICA/CCA RATIO:  0.8 ECA: 118 cm/sec LEFT ICA: 138/31 cm/sec CCA: [id number] cm/sec SYSTOLIC ICA/CCA RATIO:  1.1 ECA: 148 cm/sec RIGHT CAROTID ARTERY: There is a minimal amount of eccentric echogenic plaque involving the mid (image 6) aspects of the right common carotid artery. There is a minimal amount of intimal thickening/atherosclerotic plaque within the right carotid bulb (images 14 and 16), extending to involve the origin and proximal aspects of the right internal carotid artery, not definitely resulting in a hemodynamically significant stenosis within the right internal carotid  artery with preservation of the systolic ICA/CCA ratio. RIGHT VERTEBRAL ARTERY:  Antegrade flow LEFT CAROTID ARTERY: There is a minimal amount of eccentric echogenic plaque within the left carotid bulb (images 48 and 50), extending to involve the origin and proximal aspects of the left internal carotid artery (image 58), not definitely resulting in hemodynamically significant stenosis within the left internal carotid artery with preservation of the systolic ICA/CCA ratio. LEFT VERTEBRAL ARTERY:  Antegrade flow IMPRESSION: Minimal amount of bilateral atherosclerotic plaque, not definitely resulting in a hemodynamically significant stenosis within either internal carotid artery with preservation of the systolic ICA/CCA ratios bilaterally. Electronically Signed   By: Sandi Mariscal M.D.   On: 03/21/2019 11:36   ECHOCARDIOGRAM COMPLETE  Result Date: 03/21/2019    ECHOCARDIOGRAM REPORT   Patient Name:   Alec Bruce Date of Exam: 03/21/2019 Medical Rec #:  [id number]        Height:       72.0 in Accession #:    [id number]       Weight:       135.9 lb Date of Birth:  1955-12-28        BSA:          1.81 m Patient Age:    42 years         BP:           125/79 mmHg Patient Gender: M                HR:           107 bpm. Exam Location:  [facility] Procedure: 2D Echo Indications:    Stroke 434.91 / I163.9  History:        Patient has no prior history of Echocardiogram examinations.  TIA; Risk Factors:Former Smoker and Diabetes. Cancer.  Sonographer:    Leavy Cella RDCS (AE) Referring Phys: [phone] Adonis Yim IMPRESSIONS  1. Left ventricular ejection fraction, by estimation, is 50 to 55%. The left ventricle has low normal function. The left ventricle has no regional wall motion abnormalities. Left ventricular diastolic parameters are indeterminate.  2. Right ventricular systolic function is normal. The right ventricular size is normal. Tricuspid regurgitation signal is inadequate for assessing PA pressure.   3. The mitral valve is grossly normal. Trivial mitral valve regurgitation.  4. The aortic valve is tricuspid. Aortic valve regurgitation is not visualized. Mild aortic valve sclerosis is present, with no evidence of aortic valve stenosis.  5. The inferior vena cava is normal in size with greater than 50% respiratory variability, suggesting right atrial pressure of 3 mmHg. FINDINGS  Left Ventricle: Left ventricular ejection fraction, by estimation, is 50 to 55%. The left ventricle has low normal function. The left ventricle has no regional wall motion abnormalities. The left ventricular internal cavity size was normal in size. There is no left ventricular hypertrophy. Left ventricular diastolic parameters are indeterminate. Right Ventricle: The right ventricular size is normal. No increase in right ventricular wall thickness. Right ventricular systolic function is normal. Tricuspid regurgitation signal is inadequate for assessing PA pressure. Left Atrium: Left atrial size was normal in size. Right Atrium: Right atrial size was normal in size. Pericardium: There is no evidence of pericardial effusion. Mitral Valve: The mitral valve is grossly normal. Trivial mitral valve regurgitation. Tricuspid Valve: The tricuspid valve is grossly normal. Tricuspid valve regurgitation is trivial. Aortic Valve: The aortic valve is tricuspid. Aortic valve regurgitation is not visualized. Mild aortic valve sclerosis is present, with no evidence of aortic valve stenosis. Pulmonic Valve: The pulmonic valve was not well visualized. Pulmonic valve regurgitation is trivial. Aorta: The aortic root is normal in size and structure. Venous: The inferior vena cava is normal in size with greater than 50% respiratory variability, suggesting right atrial pressure of 3 mmHg. IAS/Shunts: No atrial level shunt detected by color flow Doppler.  LEFT VENTRICLE PLAX 2D LVIDd:         4.55 cm  Diastology LVIDs:         3.40 cm  LV e' lateral:   9.25 cm/s  LV PW:         1.02 cm  LV E/e' lateral: 5.7 LV IVS:        1.02 cm  LV e' medial:    5.77 cm/s LVOT diam:     1.90 cm  LV E/e' medial:  9.1 LV SV Index:   27.03 LVOT Area:     2.84 cm  RIGHT VENTRICLE RV S prime:     17.20 cm/s TAPSE (M-mode): 1.9 cm LEFT ATRIUM             Index       RIGHT ATRIUM           Index LA diam:        3.20 cm 1.77 cm/m  RA Area:     14.10 cm LA Vol (A2C):   16.1 ml 8.91 ml/m  RA Volume:   35.10 ml  19.41 ml/m LA Vol (A4C):   32.3 ml 17.87 ml/m LA Biplane Vol: 24.1 ml 13.33 ml/m   AORTA Ao Root diam: 3.30 cm MITRAL VALVE MV Area (PHT): 5.02 cm    SHUNTS MV Decel Time: 151 msec    Systemic Diam: 1.90 cm MV E velocity:  52.40 cm/s MV A velocity: 79.10 cm/s MV E/A ratio:  0.66 Rozann Lesches MD Electronically signed by Rozann Lesches MD Signature Date/Time: 03/21/2019/12:09:42 PM    Final    CT HEAD CODE STROKE WO CONTRAST  Result Date: 03/20/2019 CLINICAL DATA:  Code stroke. Left hand weakness beginning at 10 a.m. EXAM: CT HEAD WITHOUT CONTRAST TECHNIQUE: Contiguous axial images were obtained from the base of the skull through the vertex without intravenous contrast. COMPARISON:  None. FINDINGS: Brain: Mild atrophy and white matter hypoattenuation is present bilaterally. No acute cortical infarct present. Basal ganglia are intact. Insular ribbon is normal. No acute or focal cortical abnormalities are present. The ventricles are of normal size. No significant extraaxial fluid collection is present. The brainstem and cerebellum are within normal limits. Vascular: No hyperdense vessel or unexpected calcification. Skull: Calvarium is intact. No focal lytic or blastic lesions are present. No significant extracranial soft tissue lesion is present. Sinuses/Orbits: The paranasal sinuses and mastoid air cells are clear. The globes and orbits are within normal limits. ASPECTS [facility] Stroke Program Early CT Score) - Ganglionic level infarction (caudate, lentiform nuclei, internal  capsule, insula, M1-M3 cortex): 3/3 - Supraganglionic infarction (M4-M6 cortex): 7/7 Total score (0-10 with 10 being normal): 10/10 IMPRESSION: 1. No acute intracranial abnormality. 2. Mild atrophy and white matter hypoattenuation likely reflects the sequela of chronic microvascular ischemia. 3. ASPECTS is 10/10 These results were called by telephone at the time of interpretation on 03/20/2019 at 1:06 pm to provider Noemi Chapel, who verbally acknowledged these results. Electronically Signed   By: San Morelle M.D.   On: 03/20/2019 13:06         Discharge Exam: Vitals:   03/21/19 0906 03/21/19 1415  BP: 125/79 114/76  Pulse: (!) 107 95  Resp: 16 20  Temp: 98.5 F (36.9 C) 98.1 F (36.7 C)  SpO2: 100% 100%   Vitals:   03/21/19 0336 03/21/19 0531 03/21/19 0906 03/21/19 1415  BP: (!) 99/56 114/78 125/79 114/76  Pulse: 97 96 (!) 107 95  Resp: 20 20 16 20   Temp: 98.4 F (36.9 C) 98.2 F (36.8 C) 98.5 F (36.9 C) 98.1 F (36.7 C)  TempSrc: Oral Oral    SpO2: 99% 99% 100% 100%  Weight:      Height:        General: Pt is alert, awake, not in acute distress Cardiovascular: RRR, S1/S2 +, no rubs, no gallops Respiratory: CTA bilaterally, no wheezing, no rhonchi Abdominal: Soft, NT, ND, bowel sounds + Extremities: no edema, no cyanosis   The results of significant diagnostics from this hospitalization (including imaging, microbiology, ancillary and laboratory) are listed below for reference.    Significant Diagnostic Studies: DG Chest 2 View  Result Date: 03/20/2019 CLINICAL DATA:  Left hand numbness.  Possible stroke today. EXAM: CHEST - 2 VIEW COMPARISON:  None. FINDINGS: The chest is hyperexpanded with some attenuation of the pulmonary vasculature. Lungs are clear. Heart size is normal. No pneumothorax or pleural effusion. No acute or focal bony abnormality. IMPRESSION: No acute disease. The lungs appear emphysematous. Electronically Signed   By: Inge Rise M.D.    On: 03/20/2019 19:38   MR BRAIN WO CONTRAST  Result Date: 03/21/2019 CLINICAL DATA:  Headache, neck pain, extremity weakness EXAM: MRI HEAD WITHOUT CONTRAST TECHNIQUE: Multiplanar, multiecho pulse sequences of the brain and surrounding structures were obtained without intravenous contrast. COMPARISON:  None. FINDINGS: Brain: There is no acute infarction or intracranial hemorrhage. There is no intracranial mass, mass effect,  or edema. There is no hydrocephalus or extra-axial fluid collection. Minimal foci of T2 hyperintensity in the supratentorial white matter are nonspecific but may reflect minor chronic microvascular ischemic changes. Vascular: Major vessel flow voids at the skull base are preserved. Skull and upper cervical spine: Normal marrow signal is preserved. Sinuses/Orbits: Minor mucosal thickening.  Orbits are unremarkable. Other: Sella is unremarkable.  Mastoid air cells are clear. IMPRESSION: No evidence of recent infarction, intracranial hemorrhage, or mass. Electronically Signed   By: Macy Mis M.D.   On: 03/21/2019 08:59   MR CERVICAL SPINE WO CONTRAST  Result Date: 03/21/2019 CLINICAL DATA:  Transient ischemic attack (TIA). Additional history provided by technologist: Headache, neck pain and bilateral arm weakness for 3 days. EXAM: MRI CERVICAL SPINE WITHOUT CONTRAST TECHNIQUE: Multiplanar, multisequence MR imaging of the cervical spine was performed. No intravenous contrast was administered. COMPARISON:  No pertinent prior studies available for comparison. FINDINGS: The examination is intermittently motion degraded with up to moderate motion degradation of the acquired sequences. Alignment: Trace C4-C5 retrolisthesis. Vertebrae: Vertebral body height is maintained. Trace degenerative endplate marrow edema at C4-C5. No suspicious osseous lesion. Multilevel degenerative endplate irregularity greatest at C4-C5. Cord: No spinal cord signal abnormality. Posterior Fossa, vertebral arteries,  paraspinal tissues: Please refer to concurrently performed brain MRI for description of posterior fossa findings. Flow voids preserved within the imaged cervical vertebral arteries. Paraspinal soft tissues within normal limits. Disc levels: Disc degeneration is moderate/severe at C4-C5 and C7-T1. Only mild disc degeneration at the remaining levels. C2-C3: Mild facet hypertrophy. No significant spinal canal or neural foraminal narrowing. C3-C4: Central disc protrusion. Facet hypertrophy with mild uncinate hypertrophy. Mild relative spinal canal narrowing. Mild/moderate left neural foraminal narrowing. C4-C5: Posterior disc osteophyte complex. Uncinate, facet and ligamentum flavum hypertrophy. Moderate spinal canal stenosis with contact upon the ventral and dorsal spinal cord. Moderate bilateral neural foraminal narrowing (greater on the right). C5-C6: Disc bulge with superimposed central disc protrusion. Uncinate/facet hypertrophy. Mild spinal canal stenosis with contact upon the ventral spinal cord. Moderate bilateral neural foraminal narrowing. C6-C7: Tiny central disc protrusion. No significant spinal canal or neural foraminal narrowing. C7-T1: Disc bulge with superimposed central disc protrusion. Bilateral disc osteophyte ridge. Facet/ligamentum flavum hypertrophy. Moderate spinal canal stenosis. The disc protrusion contacts and slightly flattens the ventral spinal cord. Severe bilateral neural foraminal narrowing. IMPRESSION: Cervical spondylosis as outlined and most notably as follows. At C4-C5, multifactorial moderate spinal canal stenosis with contact upon the ventral and dorsal spinal cord. Moderate bilateral neural foraminal narrowing (greater on the right). At C5-C6, multifactorial mild spinal canal stenosis with contact upon the ventral spinal cord. Moderate bilateral neural foraminal narrowing. At C7-T1, multifactorial moderate spinal canal stenosis. A disc protrusion contacts and slightly flattens the  ventral spinal cord. Severe bilateral neural foraminal narrowing. Electronically Signed   By: Kellie Simmering DO   On: 03/21/2019 09:06   US Carotid Bilateral (at [facility] and [facility] only)  Result Date: 03/21/2019 CLINICAL DATA:  Stroke.  Dizziness for the past week. EXAM: BILATERAL CAROTID DUPLEX ULTRASOUND TECHNIQUE: Pearline Cables scale imaging, color Doppler and duplex ultrasound were performed of bilateral carotid and vertebral arteries in the neck. COMPARISON:  None. FINDINGS: Criteria: Quantification of carotid stenosis is based on velocity parameters that correlate the residual internal carotid diameter with NASCET-based stenosis levels, using the diameter of the distal internal carotid lumen as the denominator for stenosis measurement. The following velocity measurements were obtained: RIGHT ICA: 188/43 cm/sec CCA: [id number] cm/sec SYSTOLIC ICA/CCA RATIO:  0.8 ECA:  118 cm/sec LEFT ICA: 138/31 cm/sec CCA: [id number] cm/sec SYSTOLIC ICA/CCA RATIO:  1.1 ECA: 148 cm/sec RIGHT CAROTID ARTERY: There is a minimal amount of eccentric echogenic plaque involving the mid (image 6) aspects of the right common carotid artery. There is a minimal amount of intimal thickening/atherosclerotic plaque within the right carotid bulb (images 14 and 16), extending to involve the origin and proximal aspects of the right internal carotid artery, not definitely resulting in a hemodynamically significant stenosis within the right internal carotid artery with preservation of the systolic ICA/CCA ratio. RIGHT VERTEBRAL ARTERY:  Antegrade flow LEFT CAROTID ARTERY: There is a minimal amount of eccentric echogenic plaque within the left carotid bulb (images 48 and 50), extending to involve the origin and proximal aspects of the left internal carotid artery (image 58), not definitely resulting in hemodynamically significant stenosis within the left internal carotid artery with preservation of the systolic ICA/CCA ratio. LEFT VERTEBRAL ARTERY:  Antegrade flow  IMPRESSION: Minimal amount of bilateral atherosclerotic plaque, not definitely resulting in a hemodynamically significant stenosis within either internal carotid artery with preservation of the systolic ICA/CCA ratios bilaterally. Electronically Signed   By: Sandi Mariscal M.D.   On: 03/21/2019 11:36   ECHOCARDIOGRAM COMPLETE  Result Date: 03/21/2019    ECHOCARDIOGRAM REPORT   Patient Name:   Alec Bruce Date of Exam: 03/21/2019 Medical Rec #:  [id number]        Height:       72.0 in Accession #:    [id number]       Weight:       135.9 lb Date of Birth:  [birth year]        BSA:          1.81 m Patient Age:    64 years         BP:           125/79 mmHg Patient Gender: M                HR:           107 bpm. Exam Location:  [facility] Procedure: 2D Echo Indications:    Stroke 434.91 / I163.9  History:        Patient has no prior history of Echocardiogram examinations.                 TIA; Risk Factors:Former Smoker and Diabetes. Cancer.  Sonographer:    Leavy Cella RDCS (AE) Referring Phys: [phone] Cheralyn Oliver IMPRESSIONS  1. Left ventricular ejection fraction, by estimation, is 50 to 55%. The left ventricle has low normal function. The left ventricle has no regional wall motion abnormalities. Left ventricular diastolic parameters are indeterminate.  2. Right ventricular systolic function is normal. The right ventricular size is normal. Tricuspid regurgitation signal is inadequate for assessing PA pressure.  3. The mitral valve is grossly normal. Trivial mitral valve regurgitation.  4. The aortic valve is tricuspid. Aortic valve regurgitation is not visualized. Mild aortic valve sclerosis is present, with no evidence of aortic valve stenosis.  5. The inferior vena cava is normal in size with greater than 50% respiratory variability, suggesting right atrial pressure of 3 mmHg. FINDINGS  Left Ventricle: Left ventricular ejection fraction, by estimation, is 50 to 55%. The left ventricle has low normal function. The  left ventricle has no regional wall motion abnormalities. The left ventricular internal cavity size was normal in size. There is no left ventricular hypertrophy. Left ventricular diastolic parameters are indeterminate. Right Ventricle:  The right ventricular size is normal. No increase in right ventricular wall thickness. Right ventricular systolic function is normal. Tricuspid regurgitation signal is inadequate for assessing PA pressure. Left Atrium: Left atrial size was normal in size. Right Atrium: Right atrial size was normal in size. Pericardium: There is no evidence of pericardial effusion. Mitral Valve: The mitral valve is grossly normal. Trivial mitral valve regurgitation. Tricuspid Valve: The tricuspid valve is grossly normal. Tricuspid valve regurgitation is trivial. Aortic Valve: The aortic valve is tricuspid. Aortic valve regurgitation is not visualized. Mild aortic valve sclerosis is present, with no evidence of aortic valve stenosis. Pulmonic Valve: The pulmonic valve was not well visualized. Pulmonic valve regurgitation is trivial. Aorta: The aortic root is normal in size and structure. Venous: The inferior vena cava is normal in size with greater than 50% respiratory variability, suggesting right atrial pressure of 3 mmHg. IAS/Shunts: No atrial level shunt detected by color flow Doppler.  LEFT VENTRICLE PLAX 2D LVIDd:         4.55 cm  Diastology LVIDs:         3.40 cm  LV e' lateral:   9.25 cm/s LV PW:         1.02 cm  LV E/e' lateral: 5.7 LV IVS:        1.02 cm  LV e' medial:    5.77 cm/s LVOT diam:     1.90 cm  LV E/e' medial:  9.1 LV SV Index:   27.03 LVOT Area:     2.84 cm  RIGHT VENTRICLE RV S prime:     17.20 cm/s TAPSE (M-mode): 1.9 cm LEFT ATRIUM             Index       RIGHT ATRIUM           Index LA diam:        3.20 cm 1.77 cm/m  RA Area:     14.10 cm LA Vol (A2C):   16.1 ml 8.91 ml/m  RA Volume:   35.10 ml  19.41 ml/m LA Vol (A4C):   32.3 ml 17.87 ml/m LA Biplane Vol: 24.1 ml 13.33  ml/m   AORTA Ao Root diam: 3.30 cm MITRAL VALVE MV Area (PHT): 5.02 cm    SHUNTS MV Decel Time: 151 msec    Systemic Diam: 1.90 cm MV E velocity: 52.40 cm/s MV A velocity: 79.10 cm/s MV E/A ratio:  0.66 Rozann Lesches MD Electronically signed by Rozann Lesches MD Signature Date/Time: 03/21/2019/12:09:42 PM    Final    CT HEAD CODE STROKE WO CONTRAST  Result Date: 03/20/2019 CLINICAL DATA:  Code stroke. Left hand weakness beginning at 10 a.m. EXAM: CT HEAD WITHOUT CONTRAST TECHNIQUE: Contiguous axial images were obtained from the base of the skull through the vertex without intravenous contrast. COMPARISON:  None. FINDINGS: Brain: Mild atrophy and white matter hypoattenuation is present bilaterally. No acute cortical infarct present. Basal ganglia are intact. Insular ribbon is normal. No acute or focal cortical abnormalities are present. The ventricles are of normal size. No significant extraaxial fluid collection is present. The brainstem and cerebellum are within normal limits. Vascular: No hyperdense vessel or unexpected calcification. Skull: Calvarium is intact. No focal lytic or blastic lesions are present. No significant extracranial soft tissue lesion is present. Sinuses/Orbits: The paranasal sinuses and mastoid air cells are clear. The globes and orbits are within normal limits. ASPECTS [facility] Stroke Program Early CT Score) - Ganglionic level infarction (caudate, lentiform nuclei, internal capsule, insula,  M1-M3 cortex): 3/3 - Supraganglionic infarction (M4-M6 cortex): 7/7 Total score (0-10 with 10 being normal): 10/10 IMPRESSION: 1. No acute intracranial abnormality. 2. Mild atrophy and white matter hypoattenuation likely reflects the sequela of chronic microvascular ischemia. 3. ASPECTS is 10/10 These results were called by telephone at the time of interpretation on 03/20/2019 at 1:06 pm to provider Noemi Chapel, who verbally acknowledged these results. Electronically Signed   By: San Morelle M.D.   On: 03/20/2019 13:06     Microbiology: Recent Results (from the past 240 hour(s))  SARS CORONAVIRUS 2 (Dmarco Baldus 6-24 HRS) Nasopharyngeal Nasopharyngeal Swab     Status: None   Collection Time: 03/20/19  3:05 PM   Specimen: Nasopharyngeal Swab  Result Value Ref Range Status   SARS Coronavirus 2 NEGATIVE NEGATIVE Final    Comment: (NOTE) SARS-CoV-2 target nucleic acids are NOT DETECTED. The SARS-CoV-2 RNA is generally detectable in upper and lower respiratory specimens during the acute phase of infection. Negative results do not preclude SARS-CoV-2 infection, do not rule out co-infections with other pathogens, and should not be used as the sole basis for treatment or other patient management decisions. Negative results must be combined with clinical observations, patient history, and epidemiological information. The expected result is Negative. Fact Sheet for Patients: SugarRoll.be Fact Sheet for Healthcare Providers: https://www.woods-mathews.com/ This test is not yet approved or cleared by the [place] FDA and  has been authorized for detection and/or diagnosis of SARS-CoV-2 by FDA under an Emergency Use Authorization (EUA). This EUA will remain  in effect (meaning this test can be used) for the duration of the COVID-19 declaration under Section 56 4(b)(1) of the Act, 21 U.S.C. section 360bbb-3(b)(1), unless the authorization is terminated or revoked sooner. Performed at [facility], [place] [address], [place], [place] [address]      Labs: Basic Metabolic Panel: Recent Labs  Lab 03/20/19 1307 03/20/19 1307 03/20/19 1310 03/21/19 0434  NA 135  --  137 136  K 4.1   < > 4.1 3.8  CL 107  --  108 110  CO2 20*  --   --  18*  GLUCOSE 201*  --  196* 139*  BUN 36*  --  33* 28*  CREATININE 1.24  --  1.20 1.05  CALCIUM 9.4  --   --  8.8*  MG  --   --   --  2.0   < > = values in this interval not displayed.   Liver  Function Tests: Recent Labs  Lab 03/20/19 1307  AST 25  ALT 24  ALKPHOS 58  BILITOT 0.9  PROT 6.9  ALBUMIN 3.8   No results for input(s): LIPASE, AMYLASE in the last 168 hours. No results for input(s): AMMONIA in the last 168 hours. CBC: Recent Labs  Lab 03/20/19 1307 03/20/19 1310  WBC 5.0  --   NEUTROABS 2.6  --   HGB 13.2 13.3  HCT 39.5 39.0  MCV 88.8  --   PLT 263  --    Cardiac Enzymes: No results for input(s): CKTOTAL, CKMB, CKMBINDEX, TROPONINI in the last 168 hours. BNP: Invalid input(s): POCBNP CBG: Recent Labs  Lab 03/20/19 1734 03/20/19 2142 03/21/19 0730 03/21/19 1129  GLUCAP 148* 180* 159* 229*    Time coordinating discharge:  36 minutes  Signed:  Orson Eva, DO [facility] Pager: [phone] 03/21/2019, 7:04 PM

## 2019-03-21 NOTE — Progress Notes (Signed)
[place] A. Merlene Laughter, MD     [email or web address]          Alec Bruce is an 64 y.o. male.   Assessment/Plan: 1.  Transient left hand weakness likely due to transient ischemic attack.  Risk factors diabetes and age.  Aspirin and plavix for 1 month then aspirin is recommended.  Typical work-up has been initiated.  Also consider Lipitor or other statin. 2.  Left foot drop onset 1 year due to cervical myelopathy confirmed on cervical MRI. Will need  Non-emergent but urgent decompression by neurosurgery.  3.  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      BRAIN MRI FINDINGS: Brain: There is no acute infarction or intracranial hemorrhage. There is no intracranial mass, mass effect, or edema. There is no hydrocephalus or extra-axial fluid collection. Minimal foci of T2 hyperintensity in the supratentorial white matter are nonspecific but may reflect minor chronic microvascular ischemic changes.  Vascular: Major vessel flow voids at the skull base are preserved.  Skull and upper cervical spine: Normal marrow signal is preserved.  Sinuses/Orbits: Minor mucosal thickening.  Orbits are unremarkable.  Other: Sella is unremarkable.  Mastoid air cells are clear.  IMPRESSION: No evidence of recent infarction, intracranial hemorrhage, or mass.     C SPINE MRI FINDINGS: The examination is intermittently motion degraded with up to moderate motion degradation of the acquired sequences.  Alignment: Trace C4-C5 retrolisthesis.  Vertebrae: Vertebral body height is maintained. Trace degenerative endplate marrow edema at C4-C5. No suspicious osseous lesion. Multilevel degenerative endplate irregularity greatest at C4-C5.  Cord: No spinal cord signal abnormality.  Posterior Fossa, vertebral arteries, paraspinal tissues: Please refer to concurrently performed brain MRI for description of posterior fossa findings. Flow voids preserved within the imaged cervical  vertebral arteries. Paraspinal soft tissues within normal limits.  Disc levels:  Disc degeneration is moderate/severe at C4-C5 and C7-T1. Only mild disc degeneration at the remaining levels.  C2-C3: Mild facet hypertrophy. No significant spinal canal or neural foraminal narrowing.  C3-C4: Central disc protrusion. Facet hypertrophy with mild uncinate hypertrophy. Mild relative spinal canal narrowing. Mild/moderate left neural foraminal narrowing.  C4-C5: Posterior disc osteophyte complex. Uncinate, facet and ligamentum flavum hypertrophy. Moderate spinal canal stenosis with contact upon the ventral and dorsal spinal cord. Moderate bilateral neural foraminal narrowing (greater on the right).  C5-C6: Disc bulge with superimposed central disc protrusion. Uncinate/facet hypertrophy. Mild spinal canal stenosis with contact upon the ventral spinal cord. Moderate bilateral neural foraminal narrowing.  C6-C7: Tiny central disc protrusion. No significant spinal canal or neural foraminal narrowing.  C7-T1: Disc bulge with superimposed central disc protrusion. Bilateral disc osteophyte ridge. Facet/ligamentum flavum hypertrophy. Moderate spinal canal stenosis. The disc protrusion contacts and slightly flattens the ventral spinal cord. Severe bilateral neural foraminal narrowing.  IMPRESSION: Cervical spondylosis as outlined and most notably as follows.  At C4-C5, multifactorial moderate spinal canal stenosis with contact upon the ventral and dorsal spinal cord. Moderate bilateral neural foraminal narrowing (greater on the right).  At C5-C6, multifactorial mild spinal canal stenosis with contact upon the ventral spinal cord. Moderate bilateral neural foraminal narrowing.  At C7-T1, multifactorial moderate spinal canal stenosis. A disc protrusion contacts and slightly flattens the ventral spinal cord. Severe bilateral neural foraminal narrowing.    TTE  1. Left  ventricular ejection fraction, by estimation, is 50 to 55%. The  left ventricle has low normal function. The left ventricle has no regional  wall motion abnormalities. Left ventricular diastolic parameters are  indeterminate.  2. Right ventricular  systolic function is normal. The right ventricular  size is normal. Tricuspid regurgitation signal is inadequate for assessing  PA pressure.  3. The mitral valve is grossly normal. Trivial mitral valve  regurgitation.  4. The aortic valve is tricuspid. Aortic valve regurgitation is not  visualized. Mild aortic valve sclerosis is present, with no evidence of  aortic valve stenosis.  5. The inferior vena cava is normal in size with greater than 50%  respiratory variability, suggesting right atrial pressure of 3 mmHg.   FINDINGS  Left Ventricle: Left ventricular ejection fraction, by estimation, is 50  to 55%. The left ventricle has low normal function. The left ventricle has  no regional wall motion abnormalities. The left ventricular internal  cavity size was normal in size.  There is no left ventricular hypertrophy. Left ventricular diastolic  parameters are indeterminate.   Right Ventricle: The right ventricular size is normal. No increase in  right ventricular wall thickness. Right ventricular systolic function is  normal. Tricuspid regurgitation signal is inadequate for assessing PA  pressure.   Left Atrium: Left atrial size was normal in size.   Right Atrium: Right atrial size was normal in size.   Pericardium: There is no evidence of pericardial effusion.   Mitral Valve: The mitral valve is grossly normal. Trivial mitral valve  regurgitation.   Tricuspid Valve: The tricuspid valve is grossly normal. Tricuspid valve  regurgitation is trivial.   Aortic Valve: The aortic valve is tricuspid. Aortic valve regurgitation is  not visualized. Mild aortic valve sclerosis is present, with no evidence  of aortic valve stenosis.    Pulmonic Valve: The pulmonic valve was not well visualized. Pulmonic valve  regurgitation is trivial.   Aorta: The aortic root is normal in size and structure.   Venous: The inferior vena cava is normal in size with greater than 50%  respiratory variability, suggesting right atrial pressure of 3 mmHg.   IAS/Shunts: No atrial level shunt detected by color flow Doppler.    CAROTID DOPPLERS RIGHT  ICA: 188/43 cm/sec  CCA: [id number] cm/sec  SYSTOLIC ICA/CCA RATIO:  0.8  ECA: 118 cm/sec  LEFT  ICA: 138/31 cm/sec  CCA: 99991111 cm/sec  SYSTOLIC ICA/CCA RATIO:  1.1  ECA: 148 cm/sec  RIGHT CAROTID ARTERY: There is a minimal amount of eccentric echogenic plaque involving the mid (image 6) aspects of the right common carotid artery. There is a minimal amount of intimal thickening/atherosclerotic plaque within the right carotid bulb (images 14 and 16), extending to involve the origin and proximal aspects of the right internal carotid artery, not definitely resulting in a hemodynamically significant stenosis within the right internal carotid artery with preservation of the systolic ICA/CCA ratio.  RIGHT VERTEBRAL ARTERY:  Antegrade flow  LEFT CAROTID ARTERY: There is a minimal amount of eccentric echogenic plaque within the left carotid bulb (images 48 and 50), extending to involve the origin and proximal aspects of the left internal carotid artery (image 58), not definitely resulting in hemodynamically significant stenosis within the left internal carotid artery with preservation of the systolic ICA/CCA ratio.  LEFT VERTEBRAL ARTERY:  Antegrade flow  IMPRESSION: Minimal amount of bilateral atherosclerotic plaque, not definitely resulting in a hemodynamically significant stenosis within either internal carotid artery with preservation of the systolic ICA/CCA ratios bilaterally.        Objective: Vital signs in last 24 hours: Temp:  [97.9 F (36.6  C)-98.7 F (37.1 C)] 98.1 F (36.7 C) (02/16 1415) Pulse Rate:  [95-113] 95 (02/16 1415)  Resp:  [10-22] 20 (02/16 1415) BP: (99-155)/(56-95) 114/76 (02/16 1415) SpO2:  [98 %-100 %] 100 % (02/16 1415) Weight:  [61.6 kg] 61.6 kg (02/15 1720)  Intake/Output from previous day: 02/15 0701 - 02/16 0700 In: 335 [P.O.:240; I.V.:95] Out: -  Intake/Output this shift: No intake/output data recorded. Nutritional status:  Diet Order            Diet Heart Room service appropriate? Yes; Fluid consistency: Thin  Diet effective now               Lab Results: Results for orders placed or performed during the hospital encounter of 03/20/19 (from the past 48 hour(s))  Ethanol     Status: None   Collection Time: 03/20/19  1:07 PM  Result Value Ref Range   Alcohol, Ethyl (B) <10 <10 mg/dL    Comment: (NOTE) Lowest detectable limit for serum alcohol is 10 mg/dL. For medical purposes only. Performed at [facility], [address], [place], [place] [address]   Protime-INR     Status: None   Collection Time: 03/20/19  1:07 PM  Result Value Ref Range   Prothrombin Time 13.1 11.4 - 15.2 seconds   INR 1.0 0.8 - 1.2    Comment: (NOTE) INR goal varies based on device and disease states. Performed at [facility], [address] [place], [place] [address]   APTT     Status: None   Collection Time: 03/20/19  1:07 PM  Result Value Ref Range   aPTT 26 24 - 36 seconds    Comment: Performed at [facility], [address] [place], [place] [address]  CBC     Status: None   Collection Time: 03/20/19  1:07 PM  Result Value Ref Range   WBC 5.0 4.0 - 10.5 K/uL   RBC 4.45 4.22 - 5.81 MIL/uL   Hemoglobin 13.2 13.0 - 17.0 g/dL   HCT 39.5 39.0 - 52.0 %   MCV 88.8 80.0 - 100.0 fL   MCH 29.7 26.0 - 34.0 pg   MCHC 33.4 30.0 - 36.0 g/dL   RDW 12.3 11.5 - 15.5 %   Platelets 263 150 - 400 K/uL   nRBC 0.0 0.0 - 0.2 %    Comment: Performed at [facility], [address], [place], [place] [address]    Differential     Status: None   Collection Time: 03/20/19  1:07 PM  Result Value Ref Range   Neutrophils Relative % 51 %   Neutro Abs 2.6 1.7 - 7.7 K/uL   Lymphocytes Relative 38 %   Lymphs Abs 1.9 0.7 - 4.0 K/uL   Monocytes Relative 8 %   Monocytes Absolute 0.4 0.1 - 1.0 K/uL   Eosinophils Relative 1 %   Eosinophils Absolute 0.1 0.0 - 0.5 K/uL   Basophils Relative 1 %   Basophils Absolute 0.1 0.0 - 0.1 K/uL   Immature Granulocytes 1 %   Abs Immature Granulocytes 0.03 0.00 - 0.07 K/uL    Comment: Performed at [facility], [address] [place], [place] [address]  Comprehensive metabolic panel     Status: Abnormal   Collection Time: 03/20/19  1:07 PM  Result Value Ref Range   Sodium 135 135 - 145 mmol/L   Potassium 4.1 3.5 - 5.1 mmol/L   Chloride 107 98 - 111 mmol/L   CO2 20 (L) 22 - 32 mmol/L   Glucose, Bld 201 (H) 70 - 99 mg/dL   BUN 36 (H) 8 - 23  mg/dL   Creatinine, Ser 1.24 0.61 - 1.24 mg/dL   Calcium 9.4 8.9 - 10.3 mg/dL   Total Protein 6.9 6.5 - 8.1 g/dL   Albumin 3.8 3.5 - 5.0 g/dL   AST 25 15 - 41 U/L   ALT 24 0 - 44 U/L   Alkaline Phosphatase 58 38 - 126 U/L   Total Bilirubin 0.9 0.3 - 1.2 mg/dL   GFR calc non Af Amer >60 >60 mL/min   GFR calc Af Amer >60 >60 mL/min   Anion gap 8 5 - 15    Comment: Performed at [facility], [address] [place], [place] [address]  Hemoglobin A1c     Status: Abnormal   Collection Time: 03/20/19  1:07 PM  Result Value Ref Range   Hgb A1c MFr Bld 13.7 (H) 4.8 - 5.6 %    Comment: (NOTE) Pre diabetes:          5.7%-6.4% Diabetes:              >6.4% Glycemic control for   <7.0% adults with diabetes    Mean Plasma Glucose 346.49 mg/dL    Comment: Performed at [facility] [address], [place], [place] [address]  Vitamin B12     Status: None   Collection Time: 03/20/19  1:07 PM  Result Value Ref Range   Vitamin B-12 564 180 - 914 pg/mL    Comment: (NOTE) This assay is not validated for testing neonatal  or myeloproliferative syndrome specimens for Vitamin B12 levels. Performed at [facility], [address] [place], [place] [address]   Ginger Carne 8, ED     Status: Abnormal   Collection Time: 03/20/19  1:10 PM  Result Value Ref Range   Sodium 137 135 - 145 mmol/L   Potassium 4.1 3.5 - 5.1 mmol/L   Chloride 108 98 - 111 mmol/L   BUN 33 (H) 8 - 23 mg/dL   Creatinine, Ser 1.20 0.61 - 1.24 mg/dL   Glucose, Bld 196 (H) 70 - 99 mg/dL   Calcium, Ion 1.33 1.15 - 1.40 mmol/L   TCO2 21 (L) 22 - 32 mmol/L   Hemoglobin 13.3 13.0 - 17.0 g/dL   HCT 39.0 39.0 - 52.0 %  Urine rapid drug screen (hosp performed)     Status: None   Collection Time: 03/20/19  2:44 PM  Result Value Ref Range   Opiates NONE DETECTED NONE DETECTED   Cocaine NONE DETECTED NONE DETECTED   Benzodiazepines NONE DETECTED NONE DETECTED   Amphetamines NONE DETECTED NONE DETECTED   Tetrahydrocannabinol NONE DETECTED NONE DETECTED   Barbiturates NONE DETECTED NONE DETECTED    Comment: (NOTE) DRUG SCREEN FOR MEDICAL PURPOSES ONLY.  IF CONFIRMATION IS NEEDED FOR ANY PURPOSE, NOTIFY LAB WITHIN 5 DAYS. LOWEST DETECTABLE LIMITS FOR URINE DRUG SCREEN Drug Class                     Cutoff (ng/mL) Amphetamine and metabolites    1000 Barbiturate and metabolites    200 Benzodiazepine                 [id number] Tricyclics and metabolites     300 Opiates and metabolites        300 Cocaine and metabolites        300 THC                            50  Performed at [facility], [address] [place], [place] [address]   Urinalysis, Routine w reflex microscopic     Status: Abnormal   Collection Time: 03/20/19  2:44 PM  Result Value Ref Range   Color, Urine YELLOW YELLOW   APPearance HAZY (A) CLEAR   Specific Gravity, Urine 1.021 1.005 - 1.030   pH 5.0 5.0 - 8.0   Glucose, UA >=500 (A) NEGATIVE mg/dL   Hgb urine dipstick NEGATIVE NEGATIVE   Bilirubin Urine NEGATIVE NEGATIVE   Ketones, ur NEGATIVE NEGATIVE mg/dL   Protein, ur  NEGATIVE NEGATIVE mg/dL   Nitrite NEGATIVE NEGATIVE   Leukocytes,Ua NEGATIVE NEGATIVE   RBC / HPF 11-20 0 - 5 RBC/hpf   WBC, UA 0-5 0 - 5 WBC/hpf   Bacteria, UA NONE SEEN NONE SEEN   Squamous Epithelial / LPF 0-5 0 - 5   Mucus PRESENT    Hyaline Casts, UA PRESENT     Comment: Performed at [facility], [address] [place], [place] [address]  SARS CORONAVIRUS 2 (TAT 6-24 HRS) Nasopharyngeal Nasopharyngeal Swab     Status: None   Collection Time: 03/20/19  3:05 PM   Specimen: Nasopharyngeal Swab  Result Value Ref Range   SARS Coronavirus 2 NEGATIVE NEGATIVE    Comment: (NOTE) SARS-CoV-2 target nucleic acids are NOT DETECTED. The SARS-CoV-2 RNA is generally detectable in upper and lower respiratory specimens during the acute phase of infection. Negative results do not preclude SARS-CoV-2 infection, do not rule out co-infections with other pathogens, and should not be used as the sole basis for treatment or other patient management decisions. Negative results must be combined with clinical observations, patient history, and epidemiological information. The expected result is Negative. Fact Sheet for Patients: SugarRoll.be Fact Sheet for Healthcare Providers: https://www.woods-mathews.com/ This test is not yet approved or cleared by the [place] FDA and  has been authorized for detection and/or diagnosis of SARS-CoV-2 by FDA under an Emergency Use Authorization (EUA). This EUA will remain  in effect (meaning this test can be used) for the duration of the COVID-19 declaration under Section 56 4(b)(1) of the Act, 21 U.S.C. section 360bbb-3(b)(1), unless the authorization is terminated or revoked sooner. Performed at [facility], [place] [address], [place],  [address]   Glucose, capillary     Status: Abnormal   Collection Time: 03/20/19  5:34 PM  Result Value Ref Range   Glucose-Capillary 148 (H) 70 - 99 mg/dL  Glucose,  capillary     Status: Abnormal   Collection Time: 03/20/19  9:42 PM  Result Value Ref Range   Glucose-Capillary 180 (H) 70 - 99 mg/dL  Lipid panel     Status: None   Collection Time: 03/21/19  4:34 AM  Result Value Ref Range   Cholesterol 168 0 - 200 mg/dL   Triglycerides 106 <150 mg/dL   HDL 54 >40 mg/dL   Total CHOL/HDL Ratio 3.1 RATIO   VLDL 21 0 - 40 mg/dL   LDL Cholesterol 93 0 - 99 mg/dL    Comment:        Total Cholesterol/HDL:CHD Risk Coronary Heart Disease Risk Table                     Men   Women  1/2 Average Risk   3.4   3.3  Average Risk       5.0   4.4  2 X Average Risk   9.6   7.1  3 X  Average Risk  23.4   11.0        Use the calculated Patient Ratio above and the CHD Risk Table to determine the patient's CHD Risk.        ATP III CLASSIFICATION (LDL):  <100     mg/dL   Optimal  100-129  mg/dL   Near or Above                    Optimal  130-159  mg/dL   Borderline  160-189  mg/dL   High  >190     mg/dL   Very High Performed at [place] [place], [place] [id number]   Basic metabolic panel     Status: Abnormal   Collection Time: 03/21/19  4:34 AM  Result Value Ref Range   Sodium 136 135 - 145 mmol/L   Potassium 3.8 3.5 - 5.1 mmol/L   Chloride 110 98 - 111 mmol/L   CO2 18 (L) 22 - 32 mmol/L   Glucose, Bld 139 (H) 70 - 99 mg/dL   BUN 28 (H) 8 - 23 mg/dL   Creatinine, Ser 1.05 0.61 - 1.24 mg/dL   Calcium 8.8 (L) 8.9 - 10.3 mg/dL   GFR calc non Af Amer >60 >60 mL/min   GFR calc Af Amer >60 >60 mL/min   Anion gap 8 5 - 15    Comment: Performed at [facility], [address] [place], [place] [address]  Magnesium     Status: None   Collection Time: 03/21/19  4:34 AM  Result Value Ref Range   Magnesium 2.0 1.7 - 2.4 mg/dL    Comment: Performed at [facility], [address] [place], [place] [address]  HIV Antibody (routine testing w rflx)     Status: None   Collection Time: 03/21/19  4:34 AM  Result Value Ref Range   HIV Screen 4th  Generation wRfx NON REACTIVE NON REACTIVE    Comment: Performed at [place] [address] [place], [place] [address]  Glucose, capillary     Status: Abnormal   Collection Time: 03/21/19  7:30 AM  Result Value Ref Range   Glucose-Capillary 159 (H) 70 - 99 mg/dL  Glucose, capillary     Status: Abnormal   Collection Time: 03/21/19 11:29 AM  Result Value Ref Range   Glucose-Capillary 229 (H) 70 - 99 mg/dL    Lipid Panel Recent Labs    03/21/19 0434  CHOL 168  TRIG 106  HDL 54  CHOLHDL 3.1  VLDL 21  LDLCALC 93    Studies/Results:   Medications:  Scheduled Meds: . aspirin  300 mg Rectal Daily   Or  . aspirin  325 mg Oral Daily  . atorvastatin  20 mg Oral q1800  . enoxaparin (LOVENOX) injection  40 mg Subcutaneous Q24H  . insulin aspart  0-5 Units Subcutaneous QHS  . insulin aspart  0-9 Units Subcutaneous TID WC  . metoprolol tartrate  12.5 mg Oral BID  . ofloxacin  1 drop Left Eye QID   Continuous Infusions: . sodium chloride 10 mL/hr at 03/20/19 1830   PRN Meds:.acetaminophen **OR** acetaminophen (TYLENOL) oral liquid 160 mg/5 mL **OR** acetaminophen, ondansetron (ZOFRAN) IV, senna-docusate     LOS: 0 days   Laraine Samet A. Merlene Laughter, M.D.  Diplomate, Tax adviser of Psychiatry and Neurology ( Neurology).

## 2019-03-21 NOTE — Plan of Care (Signed)
  Problem: Acute Rehab PT Goals(only PT should resolve) Goal: Pt Will Perform Standing Balance Or Pre-Gait Outcome: Progressing Flowsheets (Taken 03/21/2019 0833) Pt will perform standing balance or pre-gait: with Modified Independent Goal: Pt Will Ambulate Outcome: Progressing Flowsheets (Taken 03/21/2019 0833) Pt will Ambulate:  > 125 feet  with modified independence Note: Without loss of balance Goal: Pt/caregiver will Perform Home Exercise Program Outcome: Progressing Flowsheets (Taken 03/21/2019 0833) Pt/caregiver will Perform Home Exercise Program:  For increased strengthening  For improved balance  Independently  8:34 AM, 03/21/19 Mearl Latin PT, DPT Physical Therapist at [facility]

## 2019-03-21 NOTE — Clinical Social Work Note (Signed)
Patient advises that he can afford medications prescribed at discharge. Patient scheduled with [facility].   Syreeta Figler, Clydene Pugh, LCSW

## 2019-03-21 NOTE — Care Management (Signed)
Writer made patient appointment with [facility] for 03/23/19 at 10:00am. Information was noted in AVS. Patient was made aware, [place] was placed with patient's discharge paperwork.

## 2019-03-21 NOTE — Plan of Care (Signed)
  Problem: Education: Goal: Knowledge of disease or condition will improve Outcome: Adequate for Discharge Goal: Knowledge of secondary prevention will improve Outcome: Adequate for Discharge Goal: Knowledge of patient specific risk factors addressed and post discharge goals established will improve Outcome: Adequate for Discharge Goal: Individualized Educational Video(s) Outcome: Adequate for Discharge

## 2019-03-21 NOTE — Evaluation (Signed)
Physical Therapy Evaluation Patient Details Name: Alec Bruce MRN: [id number] DOB: [birth year] Today's Date: 03/21/2019   History of Present Illness  Alec Bruce is a 64 y.o. male with medical history of remote colon cancer, diabetes mellitus presenting with left hand weakness that began around 10:45 AM on 03/20/2019.  The patient states that he was chopping wood earlier in the day outside without any difficulty or symptoms.  He finished chopping wood around 10:30 AM.  He denied any other symptoms prior to his left hand weakness onset.  He denied any dysarthria, facial droop, dysarthria, visual disturbance, other focal extremity weakness, or dysesthesia.  He denies any fevers, chills, chest pain, coughing, hemoptysis, headache.  He denies any new medications except for Metformin which she was started 2 weeks prior to this admission.  The patient states that he has not had PCP follow-up for many years.  He visited emergency department on 03/06/2019 secondary to injury of his right hand and was started on Metformin at that time.    Clinical Impression  Patient functioning near baseline for functional mobility and gait but demonstrates BLE weakness and impaired gait. Patient able to complete bed mobility and transfer independently with minimally increased time to complete. He shows min/mod impaired balance with ambulation and demonstrates some loss of balance while ambulating/turning in hallway. He has poor L foot clearance and dorsiflexion which has been an ongoing problem and he drifts left and right during ambulation. Patient does appear to be at an increased risk for falling due to impaired dynamic balance and may benefit from further physical therapy to improve balance, gait, and strength.  Patient will benefit from continued physical therapy in hospital and recommended venue below to increase strength, balance, endurance for safe ADLs and gait.      Follow Up Recommendations Outpatient PT     Equipment Recommendations  None recommended by PT    Recommendations for Other Services       Precautions / Restrictions Precautions Precautions: Fall Restrictions Weight Bearing Restrictions: No      Mobility  Bed Mobility Overal bed mobility: Modified Independent             General bed mobility comments: slightly slower, HOB elevated  Transfers Overall transfer level: Modified independent Equipment used: None             General transfer comment: slightly slower  Ambulation/Gait Ambulation/Gait assistance: Min guard Gait Distance (Feet): 150 Feet Assistive device: None Gait Pattern/deviations: Step-through pattern;Drifts right/left;Decreased step length - right;Decreased step length - left;Decreased stride length;Decreased dorsiflexion - left Gait velocity: decreased, near baseline   General Gait Details: Patient ambulates without AD with guard for safety and balance. Patient has min/mod impaired balance with gait with drifting L/R and impaired L foot clearance. He demonstrates occasional loss of balance during ambulation and had greatest difficulty with turning.  Stairs            Wheelchair Mobility    Modified Rankin (Stroke Patients Only)       Balance Overall balance assessment: Needs assistance Sitting-balance support: No upper extremity supported;Feet supported Sitting balance-Leahy Scale: Normal Sitting balance - Comments: seated EOb   Standing balance support: No upper extremity supported;During functional activity Standing balance-Leahy Scale: Fair Standing balance comment: standing/during ambulation without AD                             Pertinent Vitals/Pain Pain Assessment: No/denies pain    Home  Living Family/patient expects to be discharged to:: Private residence Living Arrangements: Other relatives Available Help at Discharge: Family Type of Home: House Home Access: Level entry     [place]: One  Towns: Environmental consultant - 2 wheels;Bedside commode;Cane - single point      Prior Function Level of Independence: Independent         Comments: patient states community ambulation without AD     Hand Dominance        Extremity/Trunk Assessment   Upper Extremity Assessment Upper Extremity Assessment: Defer to OT evaluation    Lower Extremity Assessment Lower Extremity Assessment: Generalized weakness    Cervical / Trunk Assessment Cervical / Trunk Assessment: Normal  Communication   Communication: No difficulties  Cognition Arousal/Alertness: Awake/alert Behavior During Therapy: WFL for tasks assessed/performed Overall Cognitive Status: Within Functional Limits for tasks assessed                                        General Comments      Exercises     Assessment/Plan    PT Assessment Patient needs continued PT services  PT Problem List Decreased strength;Decreased activity tolerance;Decreased balance;Decreased mobility;Decreased knowledge of use of DME       PT Treatment Interventions DME instruction;Therapeutic exercise;Gait training;Balance training;Stair training;Neuromuscular re-education;Functional mobility training;Therapeutic activities;Patient/family education    PT Goals (Current goals can be found in the Care Plan section)  Acute Rehab PT Goals Patient Stated Goal: Return home PT Goal Formulation: With patient Time For Goal Achievement: 03/28/19 Potential to Achieve Goals: Good    Frequency Min 3X/week   Barriers to discharge        Co-evaluation               AM-PAC PT "6 Clicks" Mobility  Outcome Measure Help needed turning from your back to your side while in a flat bed without using bedrails?: None Help needed moving from lying on your back to sitting on the side of a flat bed without using bedrails?: None Help needed moving to and from a bed to a chair (including a wheelchair)?: None Help needed  standing up from a chair using your arms (e.g., wheelchair or bedside chair)?: None Help needed to walk in hospital room?: A Little Help needed climbing 3-5 steps with a railing? : A Little 6 Click Score: 22    End of Session Equipment Utilized During Treatment: Gait belt Activity Tolerance: Patient tolerated treatment well Patient left: in chair;Other (comment)(with hospital staff to transport for imaging) Nurse Communication: Mobility status PT Visit Diagnosis: Unsteadiness on feet (R26.81);Other abnormalities of gait and mobility (R26.89);Muscle weakness (generalized) (M62.81)    Time: 0750-0800 PT Time Calculation (min) (ACUTE ONLY): 10 min   Charges:   PT Evaluation $PT Eval Low Complexity: 1 Low         8:32 AM, 03/21/19 Mearl Latin PT, DPT Physical Therapist at [facility]

## 2019-03-21 NOTE — Progress Notes (Addendum)
Inpatient Diabetes Program Recommendations  AACE/ADA: New Consensus Statement on Inpatient Glycemic Control   Target Ranges:  Prepandial:   less than 140 mg/dL      Peak postprandial:   less than 180 mg/dL (1-2 hours)      Critically ill patients:  140 - 180 mg/dL  Results for Alec Bruce, Alec Bruce (MRN [id number]) as of 03/21/2019 10:51  Ref. Range 03/20/2019 17:34 03/20/2019 21:42 03/21/2019 07:30  Glucose-Capillary Latest Ref Range: 70 - 99 mg/dL 148 (H) 180 (H) 159 (H)   Results for Alec Bruce, Alec Bruce (MRN [id number]) as of 03/21/2019 10:51  Ref. Range 03/20/2019 13:07  Glucose Latest Ref Range: 70 - 99 mg/dL 201 (H)  Hemoglobin A1C Latest Ref Range: 4.8 - 5.6 % 13.7 (H)   Review of Glycemic Control  Diabetes history: DM2 Outpatient Diabetes medications: Metformin 500 mg BID (just started 03/06/19) Current orders for Inpatient glycemic control: Novolog 0-9 units TID with meals, Novolog 0-5 units QHS  Inpatient Diabetes Program Recommendations:   HbgA1C: A1C 13.7% on 03/20/19 indicating an average glucose of 346 mg/dl over the past 2-3 months.  NOTE: In reviewing the chart, noted patient was seen in Emergency Room on 03/06/19 for a hand burn and was noted to have a capillary glucose of 401 mg/dl. Per note by Dr. Eulis Foster on 03/06/19 patient reported he stopped taking DM medications about 2 years ago when he lost his insurance. Patient was started on Metformin 500 mg BID on 03/06/19 and was advised to follow up with PCP regarding glycemic control.  Initial glucose 201 mg/dl and A1C 13.7% on 03/20/19. Finger stick glucose 148-180 mg/dl on 2/15 and 159 mg/dl today. Ordered Living Well with DM book. Will plan to talk with patient today.  Addendum 03/21/19@15 :22-Patient discharged home already. Per chart, patient was discharged on Glipizide 5 mg BID and Metformin 850 mg BID.  Tried to call patient's cell phone number in the chart [phone]) five times but no answer. Noted TOC notes that patient reports he can  afford medications and that he has a follow up appointment with [facility] on 03/23/19 at 10:00 am.    Thanks, Barnie Alderman, RN, MSN, CDE Diabetes Coordinator Inpatient Diabetes Program [phone] (Team Pager from 8am to 5pm)

## 2019-03-21 NOTE — Progress Notes (Signed)
OT Cancellation Note  Patient Details Name: Alec Bruce MRN: [id number] DOB: 1955-03-21   Cancelled Treatment:    Reason Eval/Treat Not Completed: OT screened, no needs identified, will sign off. Pt screened for OT needs. Pt reporting left hand is feeling normal this morning, estimates his weakness lasted approximately 2 hours. Pt with equal bilateral grip strength, BUE strength is WNL. No further OT services required as pt is at his baseline functioning.    Guadelupe Sabin, OTR/L  [phone] 03/21/2019, 7:48 AM

## 2019-03-21 NOTE — Progress Notes (Signed)
*  PRELIMINARY RESULTS* Echocardiogram 2D Echocardiogram has been performed.  Leavy Cella 03/21/2019, 10:34 AM

## 2019-03-21 NOTE — Progress Notes (Signed)
SLP Cancellation Note  Patient Details Name: Alec Bruce MRN: [id number] DOB: 1955-05-27   Cancelled treatment:       Reason Eval/Treat Not Completed: SLP screened, no needs identified, will sign off; SLP screened Pt in room. Pt denies any changes in swallowing, speech, language, or cognition. MRI negative for acute changes. SLE will be deferred at this time. Reconsult if indicated. SLP will sign off.   Thank you,  Genene Churn, [place]    [place] 03/21/2019, 2:13 PM

## 2019-03-22 LAB — HOMOCYSTEINE: Homocysteine: 9.8 umol/L (ref 0.0–17.2)

## 2019-03-22 LAB — RPR: RPR Ser Ql: NONREACTIVE

## 2019-03-31 ENCOUNTER — Encounter (HOSPITAL_COMMUNITY): Payer: Self-pay

## 2019-03-31 ENCOUNTER — Observation Stay (HOSPITAL_COMMUNITY)
Admission: EM | Admit: 2019-03-31 | Discharge: 2019-04-01 | Disposition: A | Discharge status: Home / Self Care | Payer: Self-pay | Attending: Family Medicine | Admitting: Family Medicine

## 2019-03-31 ENCOUNTER — Observation Stay (HOSPITAL_COMMUNITY): Payer: Self-pay

## 2019-03-31 ENCOUNTER — Other Ambulatory Visit: Payer: Self-pay

## 2019-03-31 DIAGNOSIS — N179 Acute kidney failure, unspecified: Principal | ICD-10-CM | POA: Diagnosis present

## 2019-03-31 DIAGNOSIS — Z8673 Personal history of transient ischemic attack (TIA), and cerebral infarction without residual deficits: Secondary | ICD-10-CM | POA: Insufficient documentation

## 2019-03-31 DIAGNOSIS — I10 Essential (primary) hypertension: Secondary | ICD-10-CM | POA: Insufficient documentation

## 2019-03-31 DIAGNOSIS — Z20822 Contact with and (suspected) exposure to covid-19: Secondary | ICD-10-CM | POA: Insufficient documentation

## 2019-03-31 DIAGNOSIS — R112 Nausea with vomiting, unspecified: Secondary | ICD-10-CM

## 2019-03-31 DIAGNOSIS — G459 Transient cerebral ischemic attack, unspecified: Secondary | ICD-10-CM

## 2019-03-31 DIAGNOSIS — R1115 Cyclical vomiting syndrome unrelated to migraine: Secondary | ICD-10-CM

## 2019-03-31 DIAGNOSIS — Z79899 Other long term (current) drug therapy: Secondary | ICD-10-CM | POA: Insufficient documentation

## 2019-03-31 DIAGNOSIS — Z7902 Long term (current) use of antithrombotics/antiplatelets: Secondary | ICD-10-CM | POA: Insufficient documentation

## 2019-03-31 DIAGNOSIS — Z85038 Personal history of other malignant neoplasm of large intestine: Secondary | ICD-10-CM | POA: Insufficient documentation

## 2019-03-31 DIAGNOSIS — Z7984 Long term (current) use of oral hypoglycemic drugs: Secondary | ICD-10-CM | POA: Insufficient documentation

## 2019-03-31 DIAGNOSIS — Z87891 Personal history of nicotine dependence: Secondary | ICD-10-CM | POA: Insufficient documentation

## 2019-03-31 DIAGNOSIS — Z789 Other specified health status: Secondary | ICD-10-CM

## 2019-03-31 DIAGNOSIS — Z7982 Long term (current) use of aspirin: Secondary | ICD-10-CM | POA: Insufficient documentation

## 2019-03-31 DIAGNOSIS — E1165 Type 2 diabetes mellitus with hyperglycemia: Secondary | ICD-10-CM | POA: Diagnosis present

## 2019-03-31 HISTORY — DX: Cerebral infarction, unspecified: I63.9

## 2019-03-31 HISTORY — DX: Essential (primary) hypertension: I10

## 2019-03-31 LAB — BLOOD GAS, VENOUS
Acid-base deficit: 6.7 mmol/L — ABNORMAL HIGH (ref 0.0–2.0)
Bicarbonate: 18 mmol/L — ABNORMAL LOW (ref 20.0–28.0)
FIO2: 21
O2 Saturation: 58.4 %
Patient temperature: 36.4
pCO2, Ven: 39.4 mmHg — ABNORMAL LOW (ref 44.0–60.0)
pH, Ven: 7.295 (ref 7.250–7.430)
pO2, Ven: 34.8 mmHg (ref 32.0–45.0)

## 2019-03-31 LAB — URINALYSIS, ROUTINE W REFLEX MICROSCOPIC
Bilirubin Urine: NEGATIVE
Glucose, UA: NEGATIVE mg/dL
Hgb urine dipstick: NEGATIVE
Ketones, ur: 20 mg/dL — AB
Leukocytes,Ua: NEGATIVE
Nitrite: NEGATIVE
Protein, ur: NEGATIVE mg/dL
Specific Gravity, Urine: 1.02 (ref 1.005–1.030)
pH: 5 (ref 5.0–8.0)

## 2019-03-31 LAB — CBC WITH DIFFERENTIAL/PLATELET
Abs Immature Granulocytes: 0.02 10*3/uL (ref 0.00–0.07)
Basophils Absolute: 0 10*3/uL (ref 0.0–0.1)
Basophils Relative: 0 %
Eosinophils Absolute: 0 10*3/uL (ref 0.0–0.5)
Eosinophils Relative: 0 %
HCT: 43.4 % (ref 39.0–52.0)
Hemoglobin: 14.4 g/dL (ref 13.0–17.0)
Immature Granulocytes: 0 %
Lymphocytes Relative: 33 %
Lymphs Abs: 1.8 10*3/uL (ref 0.7–4.0)
MCH: 29.3 pg (ref 26.0–34.0)
MCHC: 33.2 g/dL (ref 30.0–36.0)
MCV: 88.4 fL (ref 80.0–100.0)
Monocytes Absolute: 0.6 10*3/uL (ref 0.1–1.0)
Monocytes Relative: 12 %
Neutro Abs: 2.9 10*3/uL (ref 1.7–7.7)
Neutrophils Relative %: 55 %
Platelets: 297 10*3/uL (ref 150–400)
RBC: 4.91 MIL/uL (ref 4.22–5.81)
RDW: 12.3 % (ref 11.5–15.5)
WBC: 5.3 10*3/uL (ref 4.0–10.5)
nRBC: 0 % (ref 0.0–0.2)

## 2019-03-31 LAB — COMPREHENSIVE METABOLIC PANEL
ALT: 38 U/L (ref 0–44)
AST: 41 U/L (ref 15–41)
Albumin: 4.3 g/dL (ref 3.5–5.0)
Alkaline Phosphatase: 67 U/L (ref 38–126)
Anion gap: 14 (ref 5–15)
BUN: 87 mg/dL — ABNORMAL HIGH (ref 8–23)
CO2: 18 mmol/L — ABNORMAL LOW (ref 22–32)
Calcium: 10 mg/dL (ref 8.9–10.3)
Chloride: 103 mmol/L (ref 98–111)
Creatinine, Ser: 2.45 mg/dL — ABNORMAL HIGH (ref 0.61–1.24)
GFR calc Af Amer: 31 mL/min — ABNORMAL LOW (ref >60)
GFR calc non Af Amer: 27 mL/min — ABNORMAL LOW (ref >60)
Glucose, Bld: 185 mg/dL — ABNORMAL HIGH (ref 70–99)
Potassium: 4.8 mmol/L (ref 3.5–5.1)
Sodium: 135 mmol/L (ref 135–145)
Total Bilirubin: 1.5 mg/dL — ABNORMAL HIGH (ref 0.3–1.2)
Total Protein: 7.6 g/dL (ref 6.5–8.1)

## 2019-03-31 LAB — RAPID URINE DRUG SCREEN, HOSP PERFORMED
Amphetamines: NOT DETECTED
Barbiturates: NOT DETECTED
Benzodiazepines: NOT DETECTED
Cocaine: NOT DETECTED
Opiates: NOT DETECTED
Tetrahydrocannabinol: NOT DETECTED

## 2019-03-31 LAB — GLUCOSE, CAPILLARY: Glucose-Capillary: 114 mg/dL — ABNORMAL HIGH (ref 70–99)

## 2019-03-31 LAB — SARS CORONAVIRUS 2 (TAT 6-24 HRS): SARS Coronavirus 2: NEGATIVE

## 2019-03-31 LAB — ETHANOL: Alcohol, Ethyl (B): <10 mg/dL (ref <10)

## 2019-03-31 LAB — CBG MONITORING, ED
Glucose-Capillary: 171 mg/dL — ABNORMAL HIGH (ref 70–99)
Glucose-Capillary: 163 mg/dL — ABNORMAL HIGH (ref 70–99)

## 2019-03-31 LAB — LIPASE, BLOOD: Lipase: 22 U/L (ref 11–51)

## 2019-03-31 IMAGING — DX DG ABDOMEN ACUTE W/ 1V CHEST
3 series · 3 of 3 positions shown · non-contrast
Comparison: CT report [DATE].

CLINICAL DATA: Nausea vomiting.

EXAM:
DG ABDOMEN ACUTE W/ 1V CHEST

[abdomen erect]
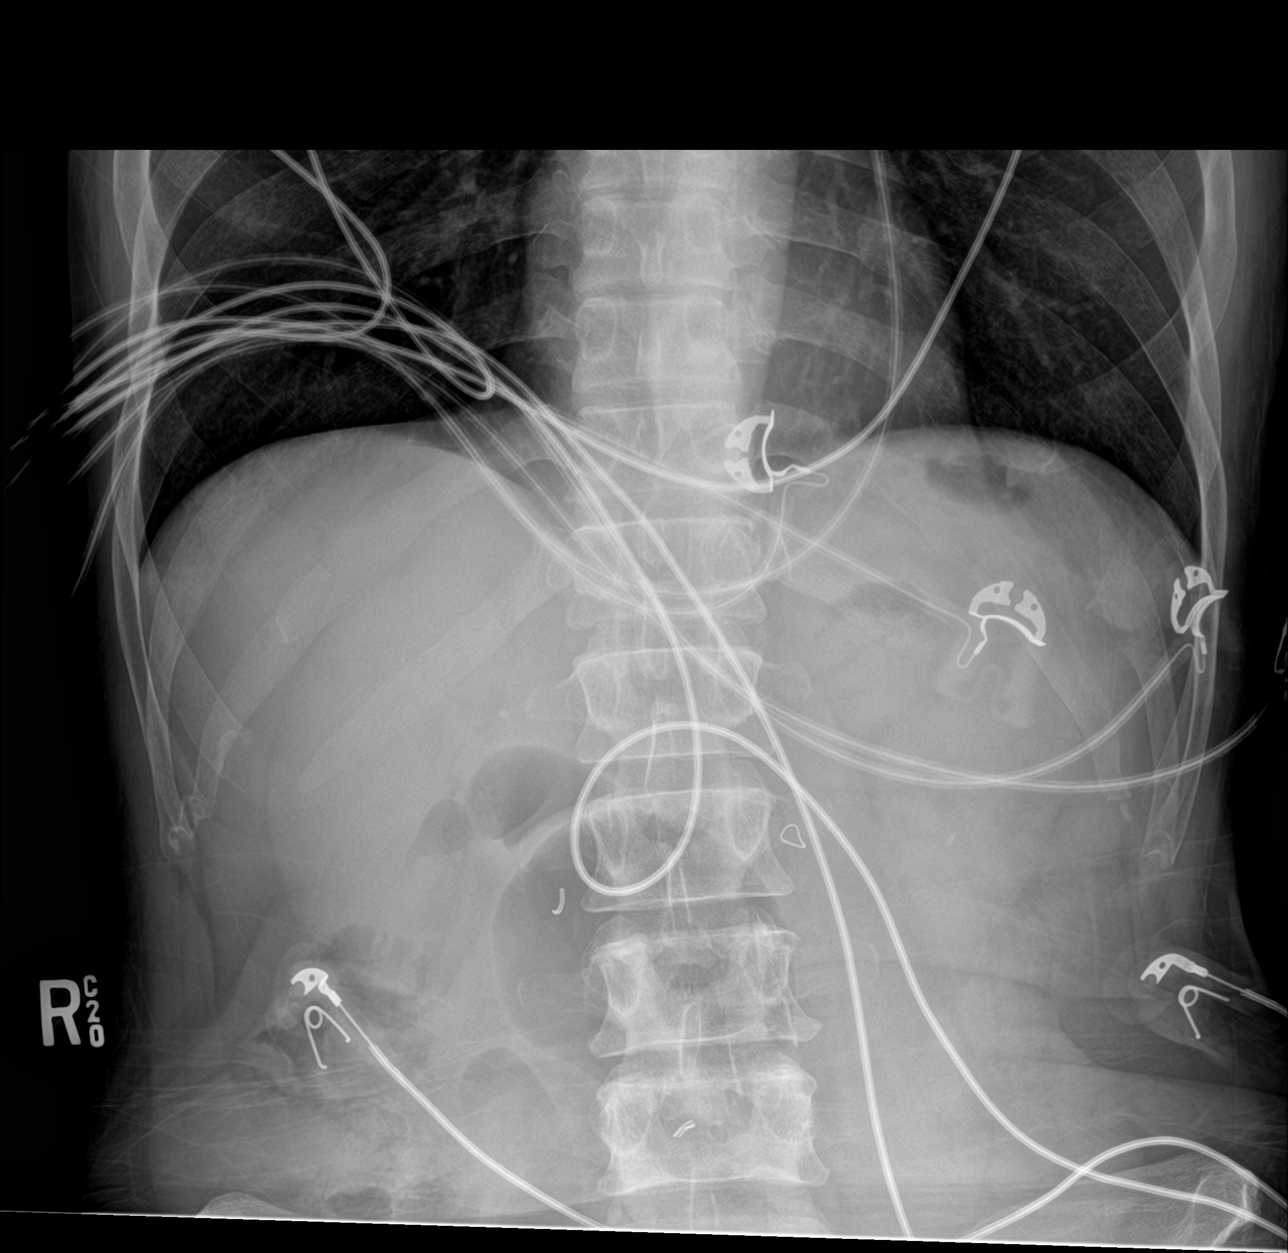

[abdomen supine]
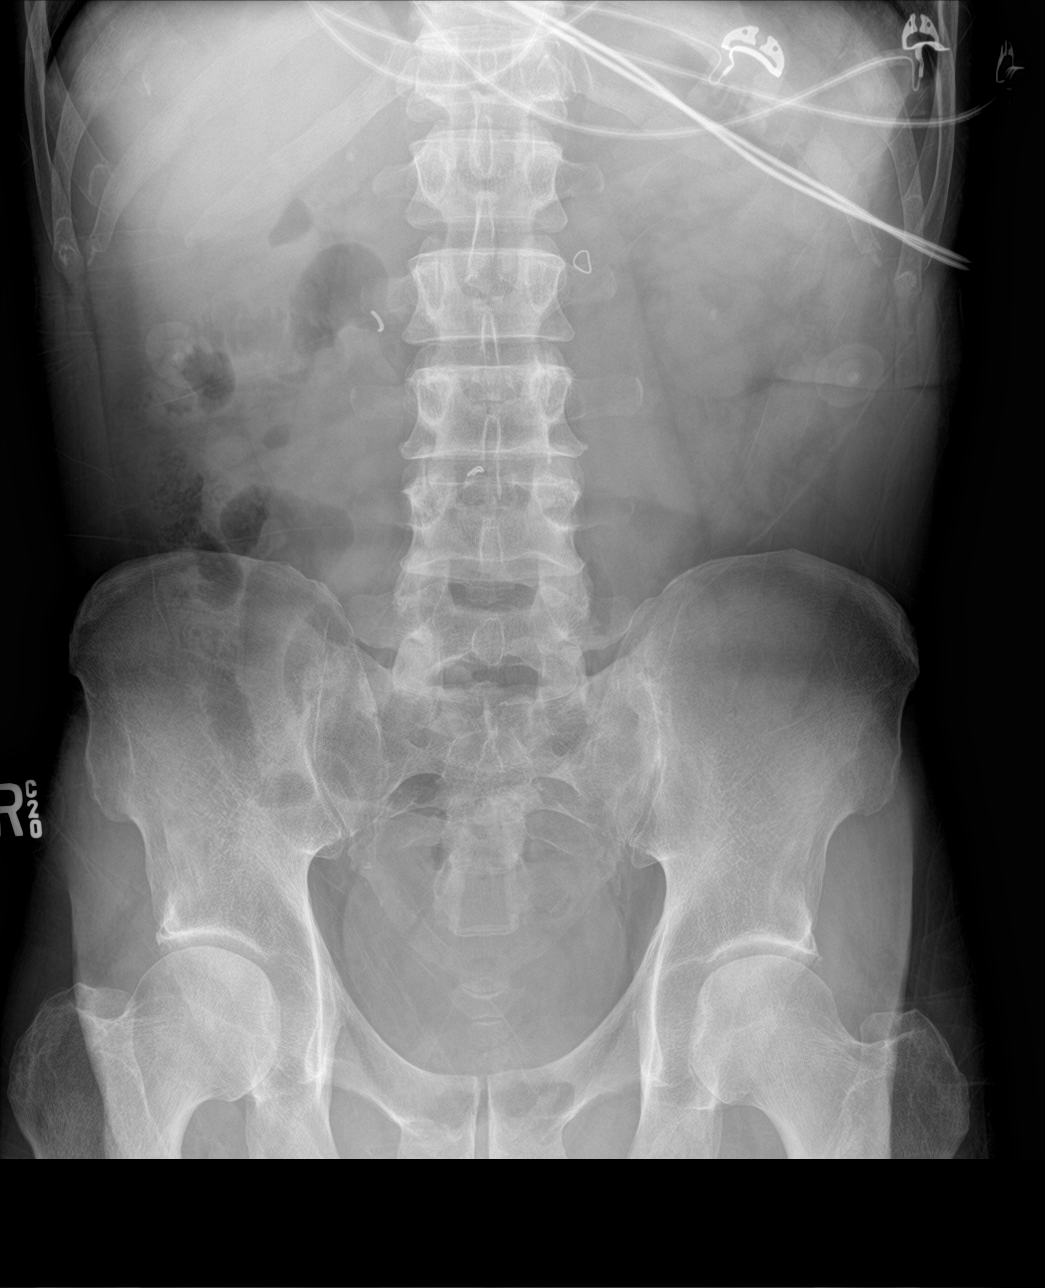

[chest pa]
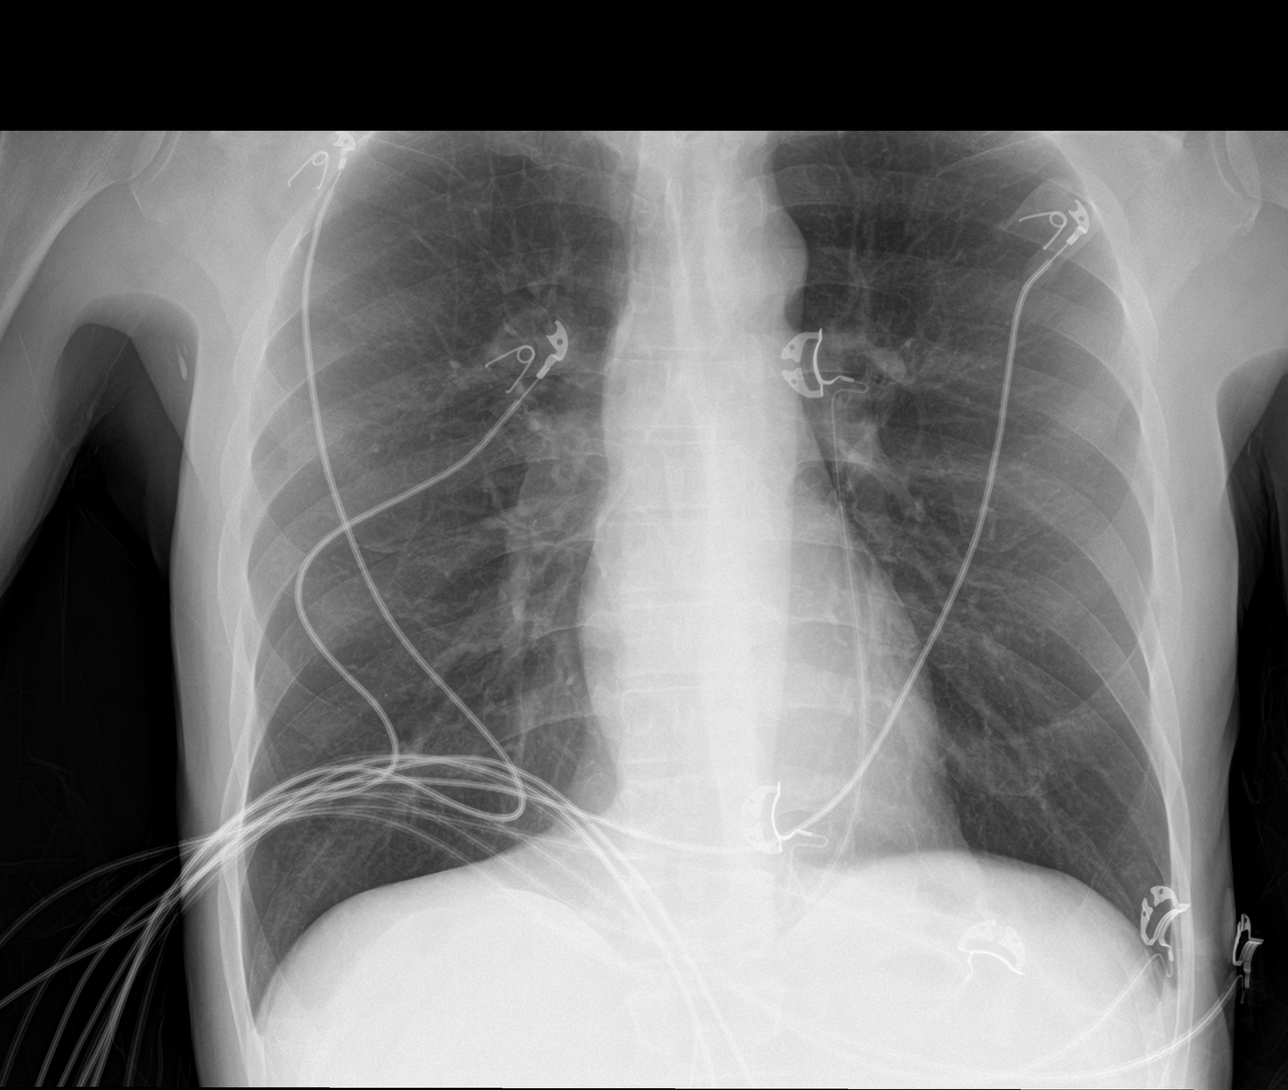

[3 of 3 positions shown; findings below may reference images not displayed]

FINDINGS: Surgical clips are noted over the abdomen. Soft tissue structures
are unremarkable. Small calcific densities noted both kidneys
consistent nephrolithiasis. Several nonspecific nondilated loops of
air-filled small bowel noted. No free air noted. No acute bony
abnormality. No acute cardiopulmonary disease. Small calcifications
in the right axilla, possibly small calcified right axillary lymph
nodes.
IMPRESSION: 1. Bilateral nephrolithiasis.

2. Several nonspecific nondilated loops of air-filled small bowel
noted. Follow-up exam can be obtained to demonstrate resolution and
to exclude developing bowel obstruction.

3.  No acute cardiopulmonary disease.

## 2019-03-31 MED ORDER — HEPARIN SODIUM (PORCINE) 5000 UNIT/ML IJ SOLN
5000.0000 [IU] | Freq: Three times a day (TID) | INTRAMUSCULAR | Status: DC
  Administered 2019-03-31 – 2019-04-01 (×3): 5000 [IU] via SUBCUTANEOUS
  Filled 2019-03-31 (×3): qty 1

## 2019-03-31 MED ORDER — SODIUM CHLORIDE 0.9% FLUSH
3.0000 mL | Freq: Two times a day (BID) | INTRAVENOUS | Status: DC
  Administered 2019-03-31: 22:00:00 3 mL via INTRAVENOUS

## 2019-03-31 MED ORDER — ACETAMINOPHEN 325 MG PO TABS: 650.0000 mg | ORAL_TABLET | Freq: Four times a day (QID) | ORAL | Status: DC | PRN

## 2019-03-31 MED ORDER — PREDNISOLONE ACETATE 1 % OP SUSP: 1.0000 [drp] | Freq: Four times a day (QID) | OPHTHALMIC | Status: DC

## 2019-03-31 MED ORDER — ONDANSETRON HCL 4 MG/2ML IJ SOLN
4.0000 mg | Freq: Four times a day (QID) | INTRAMUSCULAR | Status: DC | PRN
  Administered 2019-03-31: 16:00:00 4 mg via INTRAVENOUS
  Filled 2019-03-31: qty 2

## 2019-03-31 MED ORDER — ASPIRIN 325 MG PO TABS
325.0000 mg | ORAL_TABLET | Freq: Every day | ORAL | Status: DC
  Administered 2019-04-01: 09:00:00 325 mg via ORAL
  Filled 2019-03-31: qty 1

## 2019-03-31 MED ORDER — METOPROLOL TARTRATE 25 MG PO TABS
12.5000 mg | ORAL_TABLET | Freq: Two times a day (BID) | ORAL | Status: DC
  Administered 2019-03-31 – 2019-04-01 (×3): 12.5 mg via ORAL
  Filled 2019-03-31 (×3): qty 1

## 2019-03-31 MED ORDER — LACTULOSE 10 GM/15ML PO SOLN
60.0000 g | Freq: Once | ORAL | Status: AC
  Administered 2019-03-31: 60 g via ORAL
  Filled 2019-03-31: qty 90

## 2019-03-31 MED ORDER — ATORVASTATIN CALCIUM 40 MG PO TABS
40.0000 mg | ORAL_TABLET | Freq: Every evening | ORAL | Status: DC
  Administered 2019-03-31: 40 mg via ORAL
  Filled 2019-03-31: qty 1

## 2019-03-31 MED ORDER — SODIUM CHLORIDE 0.9 % IV SOLN: 250.0000 mL | INTRAVENOUS | Status: DC | PRN

## 2019-03-31 MED ORDER — OFLOXACIN 0.3 % OP SOLN: 1.0000 [drp] | Freq: Four times a day (QID) | OPHTHALMIC | Status: DC

## 2019-03-31 MED ORDER — ALBUTEROL SULFATE (2.5 MG/3ML) 0.083% IN NEBU: 2.5000 mg | INHALATION_SOLUTION | RESPIRATORY_TRACT | Status: DC | PRN

## 2019-03-31 MED ORDER — SODIUM CHLORIDE 0.9 % IV BOLUS
1000.0000 mL | Freq: Once | INTRAVENOUS | Status: AC
  Administered 2019-03-31: 13:00:00 1000 mL via INTRAVENOUS

## 2019-03-31 MED ORDER — SODIUM CHLORIDE 0.9% FLUSH: 3.0000 mL | INTRAVENOUS | Status: DC | PRN

## 2019-03-31 MED ORDER — GLIPIZIDE 5 MG PO TABS
5.0000 mg | ORAL_TABLET | Freq: Two times a day (BID) | ORAL | Status: DC
  Administered 2019-03-31 – 2019-04-01 (×2): 5 mg via ORAL
  Filled 2019-03-31 (×2): qty 1

## 2019-03-31 MED ORDER — SODIUM CHLORIDE 0.9 % IV SOLN: INTRAVENOUS | Status: DC

## 2019-03-31 MED ORDER — SODIUM CHLORIDE 0.9 % IV BOLUS
1000.0000 mL | Freq: Once | INTRAVENOUS | Status: AC
  Administered 2019-03-31: 21:00:00 1000 mL via INTRAVENOUS

## 2019-03-31 MED ORDER — INSULIN ASPART 100 UNIT/ML ~~LOC~~ SOLN: 0.0000 [IU] | Freq: Three times a day (TID) | SUBCUTANEOUS | Status: DC

## 2019-03-31 MED ORDER — ONDANSETRON HCL 4 MG PO TABS: 4.0000 mg | ORAL_TABLET | Freq: Four times a day (QID) | ORAL | Status: DC | PRN

## 2019-03-31 MED ORDER — TRAZODONE HCL 50 MG PO TABS
50.0000 mg | ORAL_TABLET | Freq: Every evening | ORAL | Status: DC | PRN
  Administered 2019-04-01: 50 mg via ORAL
  Filled 2019-03-31: qty 1

## 2019-03-31 MED ORDER — POLYETHYLENE GLYCOL 3350 17 G PO PACK
17.0000 g | PACK | Freq: Two times a day (BID) | ORAL | Status: DC
  Administered 2019-03-31: 16:00:00 17 g via ORAL
  Filled 2019-03-31 (×2): qty 1

## 2019-03-31 MED ORDER — INSULIN ASPART 100 UNIT/ML ~~LOC~~ SOLN: 0.0000 [IU] | Freq: Every day | SUBCUTANEOUS | Status: DC

## 2019-03-31 MED ORDER — ACETAMINOPHEN 650 MG RE SUPP: 650.0000 mg | Freq: Four times a day (QID) | RECTAL | Status: DC | PRN

## 2019-03-31 MED ORDER — POLYETHYLENE GLYCOL 3350 17 G PO PACK
17.0000 g | PACK | Freq: Every day | ORAL | Status: DC | PRN
  Administered 2019-04-01: 06:00:00 17 g via ORAL

## 2019-03-31 MED ORDER — SODIUM CHLORIDE 0.9 % IV BOLUS
1000.0000 mL | Freq: Once | INTRAVENOUS | Status: AC
  Administered 2019-03-31: 10:00:00 1000 mL via INTRAVENOUS

## 2019-03-31 NOTE — ED Triage Notes (Signed)
Pt brought in by EMS due to vomiting and weakness for at least a week. Pt was admitted here on 03/20/19 . Pt was started on Metformin and glipizide and 2 days later he started vomiting and has been unable to keep anything down. CBG 187. Pt has been taking meds off and on  No Pain

## 2019-03-31 NOTE — ED Notes (Signed)
Pt was informed that we need a urine sample. Pt states that he can not urinate at this time. 

## 2019-03-31 NOTE — ED Provider Notes (Signed)
[facility] EMERGENCY DEPARTMENT Provider Note   CSN: [id number] Arrival date & time: 03/31/19  [id number]     History Chief Complaint  Patient presents with  . Emesis  . Weakness    Alec Bruce is a 64 y.o. male.  HPI He presents for evaluation of persistent nausea and vomiting, for about a week, clear fluid, without bleeding or bile.  He has some occasional diarrhea but is not different than usual.  He has not tolerated either medications or oral intake.  He has recently hospitalized for acute weakness.  He was diagnosed with TIA.  Stroke risk evaluation was unremarkable and he was discharged on aspirin as an antiplatelet therapy, and his metformin was increased.  He was also started on glipizide.  Discussions were entertained for insulin treatment however the patient did not want to proceed with that.  He denies fever, chills, vomiting, abdominal pain, chest pain, headache or back pain.  There are no other known modifying factors.     Past Medical History:  Diagnosis Date  . Cancer ([place])    colon cancer 90s  . Diabetes mellitus without complication ([place])   . Hypertension   . Stroke [facility])    TIA    Patient Active Problem List   Diagnosis Date Noted  . Uncontrolled type 2 diabetes mellitus with hyperglycemia, without long-term current use of insulin ([place]) 03/21/2019  . TIA (transient ischemic attack) 03/20/2019    Past Surgical History:  Procedure Laterality Date  . APPENDECTOMY         No family history on file.  Social History   Tobacco Use  . Smoking status: Former Research scientist (life sciences)  . Smokeless tobacco: Never Used  Substance Use Topics  . Alcohol use: Yes    Comment: occ  . Drug use: Not Currently    Home Medications Prior to Admission medications   Medication Sig Start Date End Date Taking? Authorizing Provider  aspirin 325 MG tablet Take 1 tablet (325 mg total) by mouth daily. 03/22/19   Orson Eva, MD  atorvastatin (LIPITOR) 20 MG tablet Take 1 tablet (20 mg  total) by mouth daily at 6 PM. 03/21/19   Tat, Shanon Brow, MD  glipiZIDE (GLUCOTROL) 5 MG tablet Take 1 tablet (5 mg total) by mouth 2 (two) times daily before a meal. 03/21/19   Tat, Shanon Brow, MD  metFORMIN (GLUCOPHAGE) 850 MG tablet Take 1 tablet (850 mg total) by mouth 2 (two) times daily with a meal. 03/21/19   Tat, Shanon Brow, MD  metoprolol tartrate (LOPRESSOR) 25 MG tablet Take 0.5 tablets (12.5 mg total) by mouth 2 (two) times daily. 03/21/19   Orson Eva, MD  ofloxacin (OCUFLOX) 0.3 % ophthalmic solution INSTILL 1 DROP IN LEFT EYE FOUR TIMES DAILY FOR 1 WEEK THEN STOP 03/17/19   [provider]  prednisoLONE acetate (PRED FORTE) 1 % ophthalmic suspension  03/17/19   [provider]    Allergies    Patient has no known allergies.  Review of Systems   Review of Systems  All other systems reviewed and are negative.   Physical Exam Updated Vital Signs BP (!) 136/97 (BP Location: Left Arm)   Pulse (!) 115   Temp (!) 97.5 F (36.4 C) (Oral)   Resp 20   Wt 61 kg   SpO2 100%   BMI 18.24 kg/m   Physical Exam Vitals and nursing note reviewed.  Constitutional:      General: He is not in acute distress.    Appearance: He is  well-developed. He is ill-appearing. He is not toxic-appearing or diaphoretic.     Comments: He appears underweight  HENT:     Head: Normocephalic and atraumatic.     Right Ear: External ear normal.     Left Ear: External ear normal.     Nose: No congestion or rhinorrhea.     Mouth/Throat:     Mouth: Mucous membranes are moist.     Pharynx: No oropharyngeal exudate or posterior oropharyngeal erythema.  Eyes:     General: No scleral icterus.    Conjunctiva/sclera: Conjunctivae normal.     Pupils: Pupils are equal, round, and reactive to light.  Neck:     Trachea: Phonation normal.  Cardiovascular:     Rate and Rhythm: Normal rate and regular rhythm.     Heart sounds: Normal heart sounds.  Pulmonary:     Effort: Pulmonary effort is normal.     Breath  sounds: Normal breath sounds.  Abdominal:     General: There is no distension.     Palpations: Abdomen is soft.     Tenderness: There is no abdominal tenderness.  Musculoskeletal:        General: Normal range of motion.     Cervical back: Normal range of motion and neck supple.  Skin:    General: Skin is warm and dry.  Neurological:     Mental Status: He is alert and oriented to person, place, and time.     Cranial Nerves: No cranial nerve deficit.     Sensory: No sensory deficit.     Motor: No abnormal muscle tone.     Coordination: Coordination normal.     Comments: No dysarthria or aphasia.  Psychiatric:        Mood and Affect: Mood normal.        Behavior: Behavior normal.        Thought Content: Thought content normal.        Judgment: Judgment normal.     ED Results / Procedures / Treatments   Labs (all labs ordered are listed, but only abnormal results are displayed) Labs Reviewed  BLOOD GAS, VENOUS - Abnormal; Notable for the following components:      Result Value   pCO2, Ven 39.4 (*)    Bicarbonate 18.0 (*)    Acid-base deficit 6.7 (*)    All other components within normal limits  COMPREHENSIVE METABOLIC PANEL - Abnormal; Notable for the following components:   CO2 18 (*)    Glucose, Bld 185 (*)    BUN 87 (*)    Creatinine, Ser 2.45 (*)    Total Bilirubin 1.5 (*)    GFR calc non Af Amer 27 (*)    GFR calc Af Amer 31 (*)    All other components within normal limits  URINALYSIS, ROUTINE W REFLEX MICROSCOPIC - Abnormal; Notable for the following components:   Ketones, ur 20 (*)    All other components within normal limits  CBG MONITORING, ED - Abnormal; Notable for the following components:   Glucose-Capillary 171 (*)    All other components within normal limits  SARS CORONAVIRUS 2 (TAT 6-24 HRS)  ETHANOL  CBC WITH DIFFERENTIAL/PLATELET  RAPID URINE DRUG SCREEN, HOSP PERFORMED  LIPASE, BLOOD    EKG EKG Interpretation  Date/Time:  Friday March 31 2019 09:51:44 EST Ventricular Rate:  117 PR Interval:    QRS Duration: 87 QT Interval:  304 QTC Calculation: 425 R Axis:   90 Text Interpretation: Sinus tachycardia  Atrial premature complex Right atrial enlargement Borderline right axis deviation Anteroseptal infarct, old Minimal ST depression, lateral leads Since last tracing rate faster Otherwise no significant change Confirmed by Daleen Bo [phone]) on 03/31/2019 10:00:52 AM   Radiology No results found.  Procedures Procedures (including critical care time)  Medications Ordered in ED Medications  sodium chloride 0.9 % bolus 1,000 mL (has no administration in time range)  sodium chloride 0.9 % bolus 1,000 mL (0 mLs Intravenous Stopped 03/31/19 1217)    ED Course  I have reviewed the triage vital signs and the nursing notes.  Pertinent labs & imaging results that were available during my care of the patient were reviewed by me and considered in my medical decision making (see chart for details).  Clinical Course as of Mar 30 1249  Fri Mar 31, 2019  1247 Normal except presence of ketones  Urinalysis, Routine w reflex microscopic(!) [EW]  1247 Normal  Lipase, blood [EW]  1247 Normal  Urine rapid drug screen (hosp performed) [EW]  1247 Normal except PCO2 low, bicarb low, acid-base high  Blood gas, venous(!) [EW]  1247 Normal except CO2 low, glucose high, BUN high, creatinine high, total bilirubin high, GFR low  Comprehensive metabolic panel(!) [EW]    Clinical Course User Index [EW] Daleen Bo, MD   MDM Rules/Calculators/A&P                       Patient Vitals for the past 24 hrs:  BP Temp Temp src Pulse Resp SpO2 Weight  03/31/19 0933 (!) 136/97 (!) 97.5 F (36.4 C) Oral (!) 115 20 100 % 61 kg    12:49 PM Reevaluation with update and discussion. After initial assessment and treatment, an updated evaluation reveals no change in clinical status, findings discussed with the patient and all questions were  answered. Daleen Bo   Medical Decision Making: Nausea and vomiting since starting on glipizide and an increased dose of Metformin.  Patient with significant azotemia, and decreasing renal function.  He will require hospitalization, for stabilization and likely new medication regimen.  Doubt serious bacterial infection, or impending vascular collapse.  Doubt COVID-19 infection.  Alec Bruce was evaluated in Emergency Department on 03/31/2019 for the symptoms described in the history of present illness. He was evaluated in the context of the global COVID-19 pandemic, which necessitated consideration that the patient might be at risk for infection with the SARS-CoV-2 virus that causes COVID-19. Institutional protocols and algorithms that pertain to the evaluation of patients at risk for COVID-19 are in a state of rapid change based on information released by regulatory bodies including the CDC and federal and state organizations. These policies and algorithms were followed during the patient's care in the ED.   CRITICAL CARE- No Performed by: Daleen Bo  Nursing Notes Reviewed/ Care Coordinated Applicable Imaging Reviewed Interpretation of Laboratory Data incorporated into ED treatment  12:52 PM-Consult complete with hospitalist. Patient case explained and discussed.  He agrees to admit patient for further evaluation and treatment. Call ended at 1:14 PM  Plan: Admit   Final Clinical Impression(s) / ED Diagnoses Final diagnoses:  AKI (acute kidney injury) ([place])  Nausea and vomiting, intractability of vomiting not specified, unspecified vomiting type  Medication intolerance    Rx / DC Orders ED Discharge Orders    None       Daleen Bo, MD 03/31/19 1318

## 2019-03-31 NOTE — H&P (Signed)
Patient Demographics:    Alec Bruce, is a 64 y.o. male  MRN: [id number]   DOB - [birth year]  Admit Date - 03/31/2019  Outpatient Primary MD for the patient is Patient, No Pcp Per   Assessment & Plan:    Principal Problem:   AKI (acute kidney injury) ([place]) Active Problems:   Uncontrolled type 2 diabetes mellitus with hyperglycemia, without long-term current use of insulin ([place])    1)AKI----acute kidney injury--with creatinine up to 2.45 from a baseline of 1.0 about 10 days ago,  --suspect due to dehydration in the setting of intractable emesis compounded by Metformin, renally adjust medications, avoid nephrotoxic agents / dehydration  / hypotension -Hydrate IV, hold Metformin  2)DM2-A1c is 13.7, patient recently declined insulin, hold Metformin, give glipizide, Use Novolog/Humalog Sliding scale insulin with Accu-Cheks/Fingersticks as ordered   3) dehydration with ketonuria--- hydrate IV and orally as tolerated, antiemetics as ordered  4)h/o TIA--- continue aspirin and Lipitor  With History of - Reviewed by me  Past Medical History:  Diagnosis Date  . Cancer ([place])    colon cancer 90s  . Diabetes mellitus without complication ([place])   . Hypertension   . Stroke [facility])    TIA      Past Surgical History:  Procedure Laterality Date  . APPENDECTOMY        Chief Complaint  Patient presents with  . Emesis  . Weakness      HPI:    Alec Bruce  is a 64 y.o. male with history of partial colectomy for colon cancer in the 1990s, DM, discharged from hospital on 03/21/2019 after work-up for TIA returns to the hospital with persistent emesis and now AKI -Emesis without blood or bile, no fevers or chills, no chest pain palpitations or dizziness  --Patient has history of loose stools since his  colectomy in the 1990s,  -Abdominal films suggest bilateral nephrolithiasis, no frank obstruction, some stool noted -Is not elevated, creatinine is up to 2.45 from a baseline of 1.0, T bili is up to 1.5 from a baseline of 0.9 -CBC is WNL -UA with ketones otherwise unremarkable, urine drug screen negative   Review of systems:    In addition to the HPI above,   A full Review of  Systems was done, all other systems reviewed are negative except as noted above in HPI , .    Social History:  Reviewed by me    Social History   Tobacco Use  . Smoking status: Former Research scientist (life sciences)  . Smokeless tobacco: Never Used  Substance Use Topics  . Alcohol use: Yes    Comment: occ       Family History :  Reviewed by me  HTN   Home Medications:   Prior to Admission medications   Medication Sig Start Date End Date Taking? Authorizing Provider  aspirin 325 MG tablet Take 1 tablet (325 mg total) by mouth daily. 03/22/19  Yes Orson Eva, MD  atorvastatin (LIPITOR) 20 MG tablet Take 1 tablet (20 mg total) by mouth daily at 6 PM. 03/21/19  Yes Tat, Shanon Brow, MD  glipiZIDE (GLUCOTROL) 5 MG tablet Take 1 tablet (5 mg total) by mouth 2 (two) times daily before a meal. Patient taking differently: Take 5 mg by mouth daily.  03/21/19  Yes Tat, Shanon Brow, MD  metFORMIN (GLUCOPHAGE) 850 MG tablet Take 1 tablet (850 mg total) by mouth 2 (two) times daily with a meal. 03/21/19  Yes Tat, Shanon Brow, MD  metoprolol tartrate (LOPRESSOR) 25 MG tablet Take 0.5 tablets (12.5 mg total) by mouth 2 (two) times daily. 03/21/19  Yes Tat, Shanon Brow, MD  ofloxacin (OCUFLOX) 0.3 % ophthalmic solution INSTILL 1 DROP IN LEFT EYE FOUR TIMES DAILY FOR 1 WEEK THEN STOP 03/17/19  Yes [provider]  prednisoLONE acetate (PRED FORTE) 1 % ophthalmic suspension  03/17/19  Yes [provider]     Allergies:    No Known Allergies   Physical Exam:   Vitals  Blood pressure (!) 141/84, pulse (!) 115, temperature 98.7 F (37.1 C),  temperature source Oral, resp. rate 18, height 6' (1.829 m), weight 58.8 kg, SpO2 100 %.  Physical Examination: General appearance - alert, well appearing, and in no distress  Mental status - alert, oriented to person, place, and time,  Eyes - sclera anicteric Neck - supple, no JVD elevation , Chest - clear  to auscultation bilaterally, symmetrical air movement,  Heart - S1 and S2 normal, regular  Abdomen - soft, nontender, nondistended, healed scar from prior laparotomy, bowel sounds diminished  neurological - screening mental status exam normal, neck supple without rigidity, cranial nerves II through XII intact, DTR's normal and symmetric Extremities - no pedal edema noted, intact peripheral pulses  Skin - warm, dry     Data Review:    CBC Recent Labs  Lab 03/31/19 1004  WBC 5.3  HGB 14.4  HCT 43.4  PLT 297  MCV 88.4  MCH 29.3  MCHC 33.2  RDW 12.3  LYMPHSABS 1.8  MONOABS 0.6  EOSABS 0.0  BASOSABS 0.0   ------------------------------------------------------------------------------------------------------------------  Chemistries  Recent Labs  Lab 03/31/19 1004  NA 135  K 4.8  CL 103  CO2 18*  GLUCOSE 185*  BUN 87*  CREATININE 2.45*  CALCIUM 10.0  AST 41  ALT 38  ALKPHOS 67  BILITOT 1.5*   ------------------------------------------------------------------------------------------------------------------ estimated creatinine clearance is 25.7 mL/min (A) (by C-G formula based on SCr of 2.45 mg/dL (H)). ------------------------------------------------------------------------------------------------------------------ No results for input(s): TSH, T4TOTAL, T3FREE, THYROIDAB in the last 72 hours.  Invalid input(s): FREET3   Coagulation profile No results for input(s): INR, PROTIME in the last 168 hours. ------------------------------------------------------------------------------------------------------------------- No results for input(s): DDIMER in the last  72 hours. -------------------------------------------------------------------------------------------------------------------  Cardiac Enzymes No results for input(s): CKMB, TROPONINI, MYOGLOBIN in the last 168 hours.  Invalid input(s): CK ------------------------------------------------------------------------------------------------------------------ No results found for: BNP   ---------------------------------------------------------------------------------------------------------------  Urinalysis    Component Value Date/Time   COLORURINE YELLOW 03/31/2019 [place] 03/31/2019 1214   LABSPEC 1.020 03/31/2019 1214   PHURINE 5.0 03/31/2019 1214   GLUCOSEU NEGATIVE 03/31/2019 1214   HGBUR NEGATIVE 03/31/2019 1214   BILIRUBINUR NEGATIVE 03/31/2019 1214   KETONESUR 20 (A) 03/31/2019 1214   PROTEINUR NEGATIVE 03/31/2019 1214   UROBILINOGEN 0.2 10/31/2007 0700   NITRITE NEGATIVE 03/31/2019 1214   LEUKOCYTESUR NEGATIVE 03/31/2019 1214    ----------------------------------------------------------------------------------------------------------------   Imaging Results:    DG ABD ACUTE 2+V W 1V CHEST  Result Date: 03/31/2019 CLINICAL DATA:  Nausea vomiting. EXAM: DG ABDOMEN ACUTE W/ 1V CHEST COMPARISON:  CT report 01/21/2000. FINDINGS: Surgical clips are noted over the abdomen. Soft tissue structures are unremarkable. Small calcific densities noted both kidneys consistent nephrolithiasis. Several nonspecific nondilated loops of air-filled small bowel noted. No free air noted. No acute bony abnormality. No acute cardiopulmonary disease. Small calcifications in the right axilla, possibly small calcified right axillary lymph nodes. IMPRESSION: 1. Bilateral nephrolithiasis. 2. Several nonspecific nondilated loops of air-filled small bowel noted. Follow-up exam can be obtained to demonstrate resolution and to exclude developing bowel obstruction. 3.  No acute cardiopulmonary  disease. Electronically Signed   By: Marcello Moores  Register   On: 03/31/2019 14:19    Radiological Exams on Admission: DG ABD ACUTE 2+V W 1V CHEST  Result Date: 03/31/2019 CLINICAL DATA:  Nausea vomiting. EXAM: DG ABDOMEN ACUTE W/ 1V CHEST COMPARISON:  CT report 01/21/2000. FINDINGS: Surgical clips are noted over the abdomen. Soft tissue structures are unremarkable. Small calcific densities noted both kidneys consistent nephrolithiasis. Several nonspecific nondilated loops of air-filled small bowel noted. No free air noted. No acute bony abnormality. No acute cardiopulmonary disease. Small calcifications in the right axilla, possibly small calcified right axillary lymph nodes. IMPRESSION: 1. Bilateral nephrolithiasis. 2. Several nonspecific nondilated loops of air-filled small bowel noted. Follow-up exam can be obtained to demonstrate resolution and to exclude developing bowel obstruction. 3.  No acute cardiopulmonary disease. Electronically Signed   By: [place]   On: 03/31/2019 14:19    DVT Prophylaxis -SCD /heparin AM Labs Ordered, also please review Full Orders  Family Communication: Admission, patients condition and plan of care including tests being ordered have been discussed with the patient who indicate understanding and agree with the plan   Code Status - Full Code  Likely DC to home if renal function improves  Condition   stable  Roxan Hockey M.D on 03/31/2019 at 6:45 PM Go to [email or web address] -  for contact info  [facility] - Office  [phone]

## 2019-04-01 LAB — CBC
HCT: 33.1 % — ABNORMAL LOW (ref 39.0–52.0)
Hemoglobin: 11.1 g/dL — ABNORMAL LOW (ref 13.0–17.0)
MCH: 29.7 pg (ref 26.0–34.0)
MCHC: 33.5 g/dL (ref 30.0–36.0)
MCV: 88.5 fL (ref 80.0–100.0)
Platelets: 219 10*3/uL (ref 150–400)
RBC: 3.74 MIL/uL — ABNORMAL LOW (ref 4.22–5.81)
RDW: 12.2 % (ref 11.5–15.5)
WBC: 4.6 10*3/uL (ref 4.0–10.5)
nRBC: 0 % (ref 0.0–0.2)

## 2019-04-01 LAB — BASIC METABOLIC PANEL
Anion gap: 6 (ref 5–15)
BUN: 42 mg/dL — ABNORMAL HIGH (ref 8–23)
CO2: 18 mmol/L — ABNORMAL LOW (ref 22–32)
Calcium: 8.2 mg/dL — ABNORMAL LOW (ref 8.9–10.3)
Chloride: 115 mmol/L — ABNORMAL HIGH (ref 98–111)
Creatinine, Ser: 1.21 mg/dL (ref 0.61–1.24)
GFR calc Af Amer: >60 mL/min (ref >60)
GFR calc non Af Amer: >60 mL/min (ref >60)
Glucose, Bld: 71 mg/dL (ref 70–99)
Potassium: 3.7 mmol/L (ref 3.5–5.1)
Sodium: 139 mmol/L (ref 135–145)

## 2019-04-01 LAB — GLUCOSE, CAPILLARY
Glucose-Capillary: 81 mg/dL (ref 70–99)
Glucose-Capillary: 112 mg/dL — ABNORMAL HIGH (ref 70–99)

## 2019-04-01 MED ORDER — ACETAMINOPHEN 325 MG PO TABS: 650.0000 mg | ORAL_TABLET | Freq: Four times a day (QID) | ORAL | 0 refills | Status: DC | PRN

## 2019-04-01 MED ORDER — METOPROLOL TARTRATE 25 MG PO TABS: 12.5000 mg | ORAL_TABLET | Freq: Two times a day (BID) | ORAL | 3 refills | Status: DC

## 2019-04-01 MED ORDER — POLYETHYLENE GLYCOL 3350 17 G PO PACK: 17.0000 g | PACK | Freq: Every day | ORAL | 0 refills | Status: DC | PRN

## 2019-04-01 MED ORDER — GLIPIZIDE 10 MG PO TABS: 10.0000 mg | ORAL_TABLET | Freq: Two times a day (BID) | ORAL | 3 refills | Status: DC

## 2019-04-01 MED ORDER — ATORVASTATIN CALCIUM 40 MG PO TABS: 40.0000 mg | ORAL_TABLET | Freq: Every evening | ORAL | 11 refills | Status: DC

## 2019-04-01 MED ORDER — SITAGLIPTIN PHOSPHATE 100 MG PO TABS: 100.0000 mg | ORAL_TABLET | Freq: Every day | ORAL | 11 refills | Status: DC

## 2019-04-01 MED ORDER — ASPIRIN 325 MG PO TABS: 325.0000 mg | ORAL_TABLET | Freq: Every day | ORAL | 11 refills | Status: DC

## 2019-04-01 NOTE — Discharge Summary (Signed)
Alec Bruce, is a 64 y.o. male  DOB April 19, 1955  MRN [id number].  Admission date:  03/31/2019  Admitting Physician  Roxan Hockey, MD  Discharge Date:  04/01/2019   Primary MD  Patient, No Pcp Per  Recommendations for primary care physician for things to follow:    1)Avoid ibuprofen/Advil/Aleve/Motrin/Goody Powders/Naproxen/BC powders/Meloxicam/Diclofenac/Indomethacin and other Nonsteroidal anti-inflammatory medications as these will make you more likely to bleed and can cause stomach ulcers, can also cause Kidney problems.   2) take aspirin and Lipitor/atorvastatin as prescribed to prevent stroke  3) your diabetes is out of control ideally you should be on insulin but you have declined insulin for now, you also said you did not tolerate Metformin tablets well due to nausea and vomiting--- okay to stop Metformin for now  4) please start Januvia 100 mg tablets daily for your diabetes, increase glipizide to 10 mg twice a day with meals for your diabetes  5) please follow-up with your primary care physician within a week for repeat BMP blood test Admission Diagnosis  AKI (acute kidney injury) ([place]) [N17.9] Medication intolerance [Z78.9] Emesis, persistent [R11.15] Nausea and vomiting, intractability of vomiting not specified, unspecified vomiting type [R11.2]   Discharge Diagnosis  AKI (acute kidney injury) ([place]) [N17.9] Medication intolerance [Z78.9] Emesis, persistent [R11.15] Nausea and vomiting, intractability of vomiting not specified, unspecified vomiting type [R11.2]    Principal Problem:   AKI (acute kidney injury) ([place]) Active Problems:   Uncontrolled type 2 diabetes mellitus with hyperglycemia, without long-term current use of insulin ([place])      Past Medical History:  Diagnosis Date  . Cancer ([place])    colon cancer 90s  . Diabetes mellitus without complication ([place])   .  Hypertension   . Stroke [facility])    TIA    Past Surgical History:  Procedure Laterality Date  . APPENDECTOMY         HPI  from the history and physical done on the day of admission:     Alec Bruce  is a 64 y.o. male with history of partial colectomy for colon cancer in the 1990s, DM, discharged from hospital on 03/21/2019 after work-up for TIA returns to the hospital with persistent emesis and now AKI -Emesis without blood or bile, no fevers or chills, no chest pain palpitations or dizziness  --Patient has history of loose stools since his colectomy in the 1990s,  -Abdominal films suggest bilateral nephrolithiasis, no frank obstruction, some stool noted -Is not elevated, creatinine is up to 2.45 from a baseline of 1.0, T bili is up to 1.5 from a baseline of 0.9 -CBC is WNL -UA with ketones otherwise unremarkable, urine drug screen negative   Hospital Course:      1)AKI----acute kidney injury--admitted with creatinine up to 2.45 from a baseline of 1.0 about 10 days PTA,-this was most likely due to dehydration in the setting of intractable emesis compounded by Metformin, medications where renally adjusted, we avoided nephrotoxic agents / dehydration  / hypotension -Metformin was discontinued, with hydration creatinine is  down to 1.21  2)DM2-A1c is 13.7, patient recently declined insulin, -Patient requesting that Metformin be discontinued due to concerns about nausea and vomiting and diarrhea -Okay to discharge on glipizide 10 mg twice daily along with Januvia 100 mg daily  -Attempt should be made to try Metformin 500 mg twice daily in the near future   3) dehydration with ketonuria---  no further emesis, eating and drinking well, dehydration resolved with IV fluids  4)h/o TIA--- continue aspirin and Lipitor for stroke prevention  5) acute anemia--suspect hemodilution on due to aggressive IV hydration, hemoglobin down to 11.1, patient bowel movements are nonbloody, no dark  stools, no hematemesis -  Discharge Condition: Stable  Follow UP--follow-up with PCP within a week for repeat BMP test  Diet and Activity recommendation:  As advised  Discharge Instructions    Discharge Instructions    Call MD for:  difficulty breathing, headache or visual disturbances   Complete by: As directed    Call MD for:  persistant dizziness or light-headedness   Complete by: As directed    Call MD for:  persistant nausea and vomiting   Complete by: As directed    Call MD for:  severe uncontrolled pain   Complete by: As directed    Call MD for:  temperature >100.4   Complete by: As directed    Diet - low sodium heart healthy   Complete by: As directed    Diet Carb Modified   Complete by: As directed    Discharge instructions   Complete by: As directed    1)Avoid ibuprofen/Advil/Aleve/Motrin/Goody Powders/Naproxen/BC powders/Meloxicam/Diclofenac/Indomethacin and other Nonsteroidal anti-inflammatory medications as these will make you more likely to bleed and can cause stomach ulcers, can also cause Kidney problems.   2) take aspirin and Lipitor/atorvastatin as prescribed to prevent stroke  3) your diabetes is out of control ideally you should be on insulin but you have declined insulin for now, you also said you did not tolerate Metformin tablets well due to nausea and vomiting--- okay to stop Metformin for now  4) please start Januvia 100 mg tablets daily for your diabetes, increase glipizide to 10 mg twice a day with meals for your diabetes  5) please follow-up with your primary care physician within a week for repeat BMP blood test   Increase activity slowly   Complete by: As directed        Discharge Medications     Allergies as of 04/01/2019   No Known Allergies     Medication List    STOP taking these medications   metFORMIN 850 MG tablet Commonly known as: GLUCOPHAGE     TAKE these medications   acetaminophen 325 MG tablet Commonly known as:  TYLENOL Take 2 tablets (650 mg total) by mouth every 6 (six) hours as needed for mild pain (or Fever >/= 101).   aspirin 325 MG tablet Take 1 tablet (325 mg total) by mouth daily with breakfast. For stroke prevention Start taking on: April 02, 2019 What changed:   when to take this  additional instructions   atorvastatin 40 MG tablet Commonly known as: LIPITOR Take 1 tablet (40 mg total) by mouth every evening. For stroke prevention What changed:   medication strength  how much to take  when to take this  additional instructions   glipiZIDE 10 MG tablet Commonly known as: GLUCOTROL Take 1 tablet (10 mg total) by mouth 2 (two) times daily before a meal. What changed:   medication strength  how much to take   metoprolol tartrate 25 MG tablet Commonly known as: LOPRESSOR Take 0.5 tablets (12.5 mg total) by mouth 2 (two) times daily.   ofloxacin 0.3 % ophthalmic solution Commonly known as: OCUFLOX INSTILL 1 DROP IN LEFT EYE FOUR TIMES DAILY FOR 1 WEEK THEN STOP   polyethylene glycol 17 g packet Commonly known as: MIRALAX / GLYCOLAX Take 17 g by mouth daily as needed for mild constipation.   prednisoLONE acetate 1 % ophthalmic suspension Commonly known as: PRED FORTE   sitaGLIPtin 100 MG tablet Commonly known as: Januvia Take 1 tablet (100 mg total) by mouth daily.       Major procedures and Radiology Reports - PLEASE review detailed and final reports for all details, in brief -   DG Chest 2 View  Result Date: 03/20/2019 CLINICAL DATA:  Left hand numbness.  Possible stroke today. EXAM: CHEST - 2 VIEW COMPARISON:  None. FINDINGS: The chest is hyperexpanded with some attenuation of the pulmonary vasculature. Lungs are clear. Heart size is normal. No pneumothorax or pleural effusion. No acute or focal bony abnormality. IMPRESSION: No acute disease. The lungs appear emphysematous. Electronically Signed   By: Inge Rise M.D.   On: 03/20/2019 19:38   MR  BRAIN WO CONTRAST  Result Date: 03/21/2019 CLINICAL DATA:  Headache, neck pain, extremity weakness EXAM: MRI HEAD WITHOUT CONTRAST TECHNIQUE: Multiplanar, multiecho pulse sequences of the brain and surrounding structures were obtained without intravenous contrast. COMPARISON:  None. FINDINGS: Brain: There is no acute infarction or intracranial hemorrhage. There is no intracranial mass, mass effect, or edema. There is no hydrocephalus or extra-axial fluid collection. Minimal foci of T2 hyperintensity in the supratentorial white matter are nonspecific but may reflect minor chronic microvascular ischemic changes. Vascular: Major vessel flow voids at the skull base are preserved. Skull and upper cervical spine: Normal marrow signal is preserved. Sinuses/Orbits: Minor mucosal thickening.  Orbits are unremarkable. Other: Sella is unremarkable.  Mastoid air cells are clear. IMPRESSION: No evidence of recent infarction, intracranial hemorrhage, or mass. Electronically Signed   By: Macy Mis M.D.   On: 03/21/2019 08:59   MR CERVICAL SPINE WO CONTRAST  Result Date: 03/21/2019 CLINICAL DATA:  Transient ischemic attack (TIA). Additional history provided by technologist: Headache, neck pain and bilateral arm weakness for 3 days. EXAM: MRI CERVICAL SPINE WITHOUT CONTRAST TECHNIQUE: Multiplanar, multisequence MR imaging of the cervical spine was performed. No intravenous contrast was administered. COMPARISON:  No pertinent prior studies available for comparison. FINDINGS: The examination is intermittently motion degraded with up to moderate motion degradation of the acquired sequences. Alignment: Trace C4-C5 retrolisthesis. Vertebrae: Vertebral body height is maintained. Trace degenerative endplate marrow edema at C4-C5. No suspicious osseous lesion. Multilevel degenerative endplate irregularity greatest at C4-C5. Cord: No spinal cord signal abnormality. Posterior Fossa, vertebral arteries, paraspinal tissues: Please  refer to concurrently performed brain MRI for description of posterior fossa findings. Flow voids preserved within the imaged cervical vertebral arteries. Paraspinal soft tissues within normal limits. Disc levels: Disc degeneration is moderate/severe at C4-C5 and C7-T1. Only mild disc degeneration at the remaining levels. C2-C3: Mild facet hypertrophy. No significant spinal canal or neural foraminal narrowing. C3-C4: Central disc protrusion. Facet hypertrophy with mild uncinate hypertrophy. Mild relative spinal canal narrowing. Mild/moderate left neural foraminal narrowing. C4-C5: Posterior disc osteophyte complex. Uncinate, facet and ligamentum flavum hypertrophy. Moderate spinal canal stenosis with contact upon the ventral and dorsal spinal cord. Moderate bilateral neural foraminal narrowing (greater on the right). C5-C6: Disc  bulge with superimposed central disc protrusion. Uncinate/facet hypertrophy. Mild spinal canal stenosis with contact upon the ventral spinal cord. Moderate bilateral neural foraminal narrowing. C6-C7: Tiny central disc protrusion. No significant spinal canal or neural foraminal narrowing. C7-T1: Disc bulge with superimposed central disc protrusion. Bilateral disc osteophyte ridge. Facet/ligamentum flavum hypertrophy. Moderate spinal canal stenosis. The disc protrusion contacts and slightly flattens the ventral spinal cord. Severe bilateral neural foraminal narrowing. IMPRESSION: Cervical spondylosis as outlined and most notably as follows. At C4-C5, multifactorial moderate spinal canal stenosis with contact upon the ventral and dorsal spinal cord. Moderate bilateral neural foraminal narrowing (greater on the right). At C5-C6, multifactorial mild spinal canal stenosis with contact upon the ventral spinal cord. Moderate bilateral neural foraminal narrowing. At C7-T1, multifactorial moderate spinal canal stenosis. A disc protrusion contacts and slightly flattens the ventral spinal cord. Severe  bilateral neural foraminal narrowing. Electronically Signed   By: Kellie Simmering DO   On: 03/21/2019 09:06   US Carotid Bilateral (at [facility] and [facility] only)  Result Date: 03/21/2019 CLINICAL DATA:  Stroke.  Dizziness for the past week. EXAM: BILATERAL CAROTID DUPLEX ULTRASOUND TECHNIQUE: Pearline Cables scale imaging, color Doppler and duplex ultrasound were performed of bilateral carotid and vertebral arteries in the neck. COMPARISON:  None. FINDINGS: Criteria: Quantification of carotid stenosis is based on velocity parameters that correlate the residual internal carotid diameter with NASCET-based stenosis levels, using the diameter of the distal internal carotid lumen as the denominator for stenosis measurement. The following velocity measurements were obtained: RIGHT ICA: 188/43 cm/sec CCA: [id number] cm/sec SYSTOLIC ICA/CCA RATIO:  0.8 ECA: 118 cm/sec LEFT ICA: 138/31 cm/sec CCA: [id number] cm/sec SYSTOLIC ICA/CCA RATIO:  1.1 ECA: 148 cm/sec RIGHT CAROTID ARTERY: There is a minimal amount of eccentric echogenic plaque involving the mid (image 6) aspects of the right common carotid artery. There is a minimal amount of intimal thickening/atherosclerotic plaque within the right carotid bulb (images 14 and 16), extending to involve the origin and proximal aspects of the right internal carotid artery, not definitely resulting in a hemodynamically significant stenosis within the right internal carotid artery with preservation of the systolic ICA/CCA ratio. RIGHT VERTEBRAL ARTERY:  Antegrade flow LEFT CAROTID ARTERY: There is a minimal amount of eccentric echogenic plaque within the left carotid bulb (images 48 and 50), extending to involve the origin and proximal aspects of the left internal carotid artery (image 58), not definitely resulting in hemodynamically significant stenosis within the left internal carotid artery with preservation of the systolic ICA/CCA ratio. LEFT VERTEBRAL ARTERY:  Antegrade flow IMPRESSION: Minimal amount of  bilateral atherosclerotic plaque, not definitely resulting in a hemodynamically significant stenosis within either internal carotid artery with preservation of the systolic ICA/CCA ratios bilaterally. Electronically Signed   By: Sandi Mariscal M.D.   On: 03/21/2019 11:36   DG ABD ACUTE 2+V W 1V CHEST  Result Date: 03/31/2019 CLINICAL DATA:  Nausea vomiting. EXAM: DG ABDOMEN ACUTE W/ 1V CHEST COMPARISON:  CT report 01/21/2000. FINDINGS: Surgical clips are noted over the abdomen. Soft tissue structures are unremarkable. Small calcific densities noted both kidneys consistent nephrolithiasis. Several nonspecific nondilated loops of air-filled small bowel noted. No free air noted. No acute bony abnormality. No acute cardiopulmonary disease. Small calcifications in the right axilla, possibly small calcified right axillary lymph nodes. IMPRESSION: 1. Bilateral nephrolithiasis. 2. Several nonspecific nondilated loops of air-filled small bowel noted. Follow-up exam can be obtained to demonstrate resolution and to exclude developing bowel obstruction. 3.  No acute cardiopulmonary disease. Electronically Signed  ByMarcello Moores  Register   On: 03/31/2019 14:19   ECHOCARDIOGRAM COMPLETE  Result Date: 03/21/2019    ECHOCARDIOGRAM REPORT   Patient Name:   NOXX REECK Date of Exam: 03/21/2019 Medical Rec #:  [id number]        Height:       72.0 in Accession #:    [id number]       Weight:       135.9 lb Date of Birth:  May 05, 1955        BSA:          1.81 m Patient Age:    12 years         BP:           125/79 mmHg Patient Gender: M                HR:           107 bpm. Exam Location:  [facility] Procedure: 2D Echo Indications:    Stroke 434.91 / I163.9  History:        Patient has no prior history of Echocardiogram examinations.                 TIA; Risk Factors:Former Smoker and Diabetes. Cancer.  Sonographer:    Leavy Cella RDCS (AE) Referring Phys: [phone] DAVID TAT IMPRESSIONS  1. Left ventricular ejection fraction, by  estimation, is 50 to 55%. The left ventricle has low normal function. The left ventricle has no regional wall motion abnormalities. Left ventricular diastolic parameters are indeterminate.  2. Right ventricular systolic function is normal. The right ventricular size is normal. Tricuspid regurgitation signal is inadequate for assessing PA pressure.  3. The mitral valve is grossly normal. Trivial mitral valve regurgitation.  4. The aortic valve is tricuspid. Aortic valve regurgitation is not visualized. Mild aortic valve sclerosis is present, with no evidence of aortic valve stenosis.  5. The inferior vena cava is normal in size with greater than 50% respiratory variability, suggesting right atrial pressure of 3 mmHg. FINDINGS  Left Ventricle: Left ventricular ejection fraction, by estimation, is 50 to 55%. The left ventricle has low normal function. The left ventricle has no regional wall motion abnormalities. The left ventricular internal cavity size was normal in size. There is no left ventricular hypertrophy. Left ventricular diastolic parameters are indeterminate. Right Ventricle: The right ventricular size is normal. No increase in right ventricular wall thickness. Right ventricular systolic function is normal. Tricuspid regurgitation signal is inadequate for assessing PA pressure. Left Atrium: Left atrial size was normal in size. Right Atrium: Right atrial size was normal in size. Pericardium: There is no evidence of pericardial effusion. Mitral Valve: The mitral valve is grossly normal. Trivial mitral valve regurgitation. Tricuspid Valve: The tricuspid valve is grossly normal. Tricuspid valve regurgitation is trivial. Aortic Valve: The aortic valve is tricuspid. Aortic valve regurgitation is not visualized. Mild aortic valve sclerosis is present, with no evidence of aortic valve stenosis. Pulmonic Valve: The pulmonic valve was not well visualized. Pulmonic valve regurgitation is trivial. Aorta: The aortic root  is normal in size and structure. Venous: The inferior vena cava is normal in size with greater than 50% respiratory variability, suggesting right atrial pressure of 3 mmHg. IAS/Shunts: No atrial level shunt detected by color flow Doppler.  LEFT VENTRICLE PLAX 2D LVIDd:         4.55 cm  Diastology LVIDs:         3.40 cm  LV e' lateral:  9.25 cm/s LV PW:         1.02 cm  LV E/e' lateral: 5.7 LV IVS:        1.02 cm  LV e' medial:    5.77 cm/s LVOT diam:     1.90 cm  LV E/e' medial:  9.1 LV SV Index:   27.03 LVOT Area:     2.84 cm  RIGHT VENTRICLE RV S prime:     17.20 cm/s TAPSE (M-mode): 1.9 cm LEFT ATRIUM             Index       RIGHT ATRIUM           Index LA diam:        3.20 cm 1.77 cm/m  RA Area:     14.10 cm LA Vol (A2C):   16.1 ml 8.91 ml/m  RA Volume:   35.10 ml  19.41 ml/m LA Vol (A4C):   32.3 ml 17.87 ml/m LA Biplane Vol: 24.1 ml 13.33 ml/m   AORTA Ao Root diam: 3.30 cm MITRAL VALVE MV Area (PHT): 5.02 cm    SHUNTS MV Decel Time: 151 msec    Systemic Diam: 1.90 cm MV E velocity: 52.40 cm/s MV A velocity: 79.10 cm/s MV E/A ratio:  0.66 Rozann Lesches MD Electronically signed by Rozann Lesches MD Signature Date/Time: 03/21/2019/12:09:42 PM    Final    CT HEAD CODE STROKE WO CONTRAST  Result Date: 03/20/2019 CLINICAL DATA:  Code stroke. Left hand weakness beginning at 10 a.m. EXAM: CT HEAD WITHOUT CONTRAST TECHNIQUE: Contiguous axial images were obtained from the base of the skull through the vertex without intravenous contrast. COMPARISON:  None. FINDINGS: Brain: Mild atrophy and white matter hypoattenuation is present bilaterally. No acute cortical infarct present. Basal ganglia are intact. Insular ribbon is normal. No acute or focal cortical abnormalities are present. The ventricles are of normal size. No significant extraaxial fluid collection is present. The brainstem and cerebellum are within normal limits. Vascular: No hyperdense vessel or unexpected calcification. Skull: Calvarium is  intact. No focal lytic or blastic lesions are present. No significant extracranial soft tissue lesion is present. Sinuses/Orbits: The paranasal sinuses and mastoid air cells are clear. The globes and orbits are within normal limits. ASPECTS [facility] Stroke Program Early CT Score) - Ganglionic level infarction (caudate, lentiform nuclei, internal capsule, insula, M1-M3 cortex): 3/3 - Supraganglionic infarction (M4-M6 cortex): 7/7 Total score (0-10 with 10 being normal): 10/10 IMPRESSION: 1. No acute intracranial abnormality. 2. Mild atrophy and white matter hypoattenuation likely reflects the sequela of chronic microvascular ischemia. 3. ASPECTS is 10/10 These results were called by telephone at the time of interpretation on 03/20/2019 at 1:06 pm to provider Noemi Chapel, who verbally acknowledged these results. Electronically Signed   By: San Morelle M.D.   On: 03/20/2019 13:06    Micro Results    Recent Results (from the past 240 hour(s))  SARS CORONAVIRUS 2 (TAT 6-24 HRS) Nasopharyngeal Nasopharyngeal Swab     Status: None   Collection Time: 03/31/19 12:47 PM   Specimen: Nasopharyngeal Swab  Result Value Ref Range Status   SARS Coronavirus 2 NEGATIVE NEGATIVE Final    Comment: (NOTE) SARS-CoV-2 target nucleic acids are NOT DETECTED. The SARS-CoV-2 RNA is generally detectable in upper and lower respiratory specimens during the acute phase of infection. Negative results do not preclude SARS-CoV-2 infection, do not rule out co-infections with other pathogens, and should not be used as the sole basis for treatment or other  patient management decisions. Negative results must be combined with clinical observations, patient history, and epidemiological information. The expected result is Negative. Fact Sheet for Patients: SugarRoll.be Fact Sheet for Healthcare Providers: https://www.woods-mathews.com/ This test is not yet approved or cleared by the  [place] FDA and  has been authorized for detection and/or diagnosis of SARS-CoV-2 by FDA under an Emergency Use Authorization (EUA). This EUA will remain  in effect (meaning this test can be used) for the duration of the COVID-19 declaration under Section 56 4(b)(1) of the Act, 21 U.S.C. section 360bbb-3(b)(1), unless the authorization is terminated or revoked sooner. Performed at [facility], [place] [address], [place], [place] [address]        Today   Subjective    Alec Bruce today has no new complaints -Had numerous brown semisolid/mushy stools without blood -No further nausea or vomiting, eating and drinking well -Voiding well         Patient has been seen and examined prior to discharge   Objective   Blood pressure 136/75, pulse 98, temperature 98.5 F (36.9 C), temperature source Oral, resp. rate 16, height 6' (1.829 m), weight 58.8 kg, SpO2 99 %.   Intake/Output Summary (Last 24 hours) at 04/01/2019 1126 Last data filed at 04/01/2019 0900 Gross per 24 hour  Intake 2480 ml  Output 600 ml  Net 1880 ml    Exam Gen:- Awake Alert, no acute distress  HEENT:- [place].AT, No sclera icterus Neck-Supple Neck,No JVD,.  Lungs-  CTAB , good air movement bilaterally  CV- S1, S2 normal, regular Abd-  +ve B.Sounds, Abd Soft, No tenderness,    Extremity/Skin:- No  edema,   good pulses Psych-affect is appropriate, oriented x3 Neuro-no new focal deficits, no tremors    Data Review   CBC w Diff:  Lab Results  Component Value Date   WBC 4.6 04/01/2019   HGB 11.1 (L) 04/01/2019   HCT 33.1 (L) 04/01/2019   PLT 219 04/01/2019   LYMPHOPCT 33 03/31/2019   MONOPCT 12 03/31/2019   EOSPCT 0 03/31/2019   BASOPCT 0 03/31/2019    CMP:  Lab Results  Component Value Date   NA 139 04/01/2019   K 3.7 04/01/2019   CL 115 (H) 04/01/2019   CO2 18 (L) 04/01/2019   BUN 42 (H) 04/01/2019   CREATININE 1.21 04/01/2019   PROT 7.6 03/31/2019   ALBUMIN 4.3 03/31/2019    BILITOT 1.5 (H) 03/31/2019   ALKPHOS 67 03/31/2019   AST 41 03/31/2019   ALT 38 03/31/2019  .   Total Discharge time is about 33 minutes  Roxan Hockey M.D on 04/01/2019 at 11:26 AM  Go to [email or web address] -  for contact info  [facility] - Office  [phone]

## 2019-04-01 NOTE — Discharge Instructions (Signed)
1)Avoid ibuprofen/Advil/Aleve/Motrin/Goody Powders/Naproxen/BC powders/Meloxicam/Diclofenac/Indomethacin and other Nonsteroidal anti-inflammatory medications as these will make you more likely to bleed and can cause stomach ulcers, can also cause Kidney problems.   2) take aspirin and Lipitor/atorvastatin as prescribed to prevent stroke  3) your diabetes is out of control ideally you should be on insulin but you have declined insulin for now, you also said you did not tolerate Metformin tablets well due to nausea and vomiting--- okay to stop Metformin for now  4) please start Januvia 100 mg tablets daily for your diabetes, increase glipizide to 10 mg twice a day with meals for your diabetes  5) please follow-up with your primary care physician within a week for repeat BMP blood test

## 2019-04-01 NOTE — Plan of Care (Signed)
  Problem: Education: Goal: Knowledge of General Education information will improve Description: Including pain rating scale, medication(s)/side effects and non-pharmacologic comfort measures Outcome: Progressing   Problem: Elimination: Goal: Will not experience complications related to bowel motility Outcome: Not Progressing   

## 2019-04-01 NOTE — Progress Notes (Signed)
Discussed AVS including change in medications & need for follow up appointments with the patient and all questioned fully answered. Patient taken with personal belongings to discharge area.

## 2019-10-16 ENCOUNTER — Emergency Department (HOSPITAL_COMMUNITY)
Admission: EM | Admit: 2019-10-16 | Discharge: 2019-10-16 | Disposition: A | Discharge status: Home / Self Care | Payer: Self-pay | Attending: Emergency Medicine | Admitting: Emergency Medicine

## 2019-10-16 ENCOUNTER — Other Ambulatory Visit: Payer: Self-pay

## 2019-10-16 ENCOUNTER — Encounter (HOSPITAL_COMMUNITY): Payer: Self-pay

## 2019-10-16 ENCOUNTER — Emergency Department (HOSPITAL_COMMUNITY): Payer: Self-pay

## 2019-10-16 DIAGNOSIS — R55 Syncope and collapse: Secondary | ICD-10-CM | POA: Insufficient documentation

## 2019-10-16 DIAGNOSIS — Z8673 Personal history of transient ischemic attack (TIA), and cerebral infarction without residual deficits: Secondary | ICD-10-CM | POA: Insufficient documentation

## 2019-10-16 DIAGNOSIS — Z7984 Long term (current) use of oral hypoglycemic drugs: Secondary | ICD-10-CM | POA: Insufficient documentation

## 2019-10-16 DIAGNOSIS — Z79899 Other long term (current) drug therapy: Secondary | ICD-10-CM | POA: Insufficient documentation

## 2019-10-16 DIAGNOSIS — Z87891 Personal history of nicotine dependence: Secondary | ICD-10-CM | POA: Insufficient documentation

## 2019-10-16 DIAGNOSIS — I10 Essential (primary) hypertension: Secondary | ICD-10-CM | POA: Insufficient documentation

## 2019-10-16 DIAGNOSIS — Z85038 Personal history of other malignant neoplasm of large intestine: Secondary | ICD-10-CM | POA: Insufficient documentation

## 2019-10-16 DIAGNOSIS — Z7982 Long term (current) use of aspirin: Secondary | ICD-10-CM | POA: Insufficient documentation

## 2019-10-16 DIAGNOSIS — R531 Weakness: Secondary | ICD-10-CM | POA: Insufficient documentation

## 2019-10-16 DIAGNOSIS — E119 Type 2 diabetes mellitus without complications: Secondary | ICD-10-CM | POA: Insufficient documentation

## 2019-10-16 LAB — CBC
HCT: 36.8 % — ABNORMAL LOW (ref 39.0–52.0)
Hemoglobin: 11.9 g/dL — ABNORMAL LOW (ref 13.0–17.0)
MCH: 30.2 pg (ref 26.0–34.0)
MCHC: 32.3 g/dL (ref 30.0–36.0)
MCV: 93.4 fL (ref 80.0–100.0)
Platelets: 240 10*3/uL (ref 150–400)
RBC: 3.94 MIL/uL — ABNORMAL LOW (ref 4.22–5.81)
RDW: 12.9 % (ref 11.5–15.5)
WBC: 6.1 10*3/uL (ref 4.0–10.5)
nRBC: 0 % (ref 0.0–0.2)

## 2019-10-16 LAB — COMPREHENSIVE METABOLIC PANEL
ALT: 40 U/L (ref 0–44)
AST: 27 U/L (ref 15–41)
Albumin: 4.1 g/dL (ref 3.5–5.0)
Alkaline Phosphatase: 46 U/L (ref 38–126)
Anion gap: 10 (ref 5–15)
BUN: 39 mg/dL — ABNORMAL HIGH (ref 8–23)
CO2: 19 mmol/L — ABNORMAL LOW (ref 22–32)
Calcium: 9.7 mg/dL (ref 8.9–10.3)
Chloride: 109 mmol/L (ref 98–111)
Creatinine, Ser: 1.69 mg/dL — ABNORMAL HIGH (ref 0.61–1.24)
GFR calc Af Amer: 49 mL/min — ABNORMAL LOW (ref >60)
GFR calc non Af Amer: 42 mL/min — ABNORMAL LOW (ref >60)
Glucose, Bld: 63 mg/dL — ABNORMAL LOW (ref 70–99)
Potassium: 4.1 mmol/L (ref 3.5–5.1)
Sodium: 138 mmol/L (ref 135–145)
Total Bilirubin: 1 mg/dL (ref 0.3–1.2)
Total Protein: 6.9 g/dL (ref 6.5–8.1)

## 2019-10-16 LAB — TROPONIN I (HIGH SENSITIVITY)
Troponin I (High Sensitivity): 11 ng/L (ref <18)
Troponin I (High Sensitivity): 13 ng/L (ref <18)

## 2019-10-16 LAB — CBG MONITORING, ED
Glucose-Capillary: 43 mg/dL — CL (ref 70–99)
Glucose-Capillary: 210 mg/dL — ABNORMAL HIGH (ref 70–99)

## 2019-10-16 IMAGING — DX DG CHEST 1V PORT
1 series · 1 of 1 positions shown · non-contrast
Comparison: [DATE]

CLINICAL DATA: Altered mental status and weakness

EXAM:
PORTABLE CHEST 1 VIEW

[chest ap]
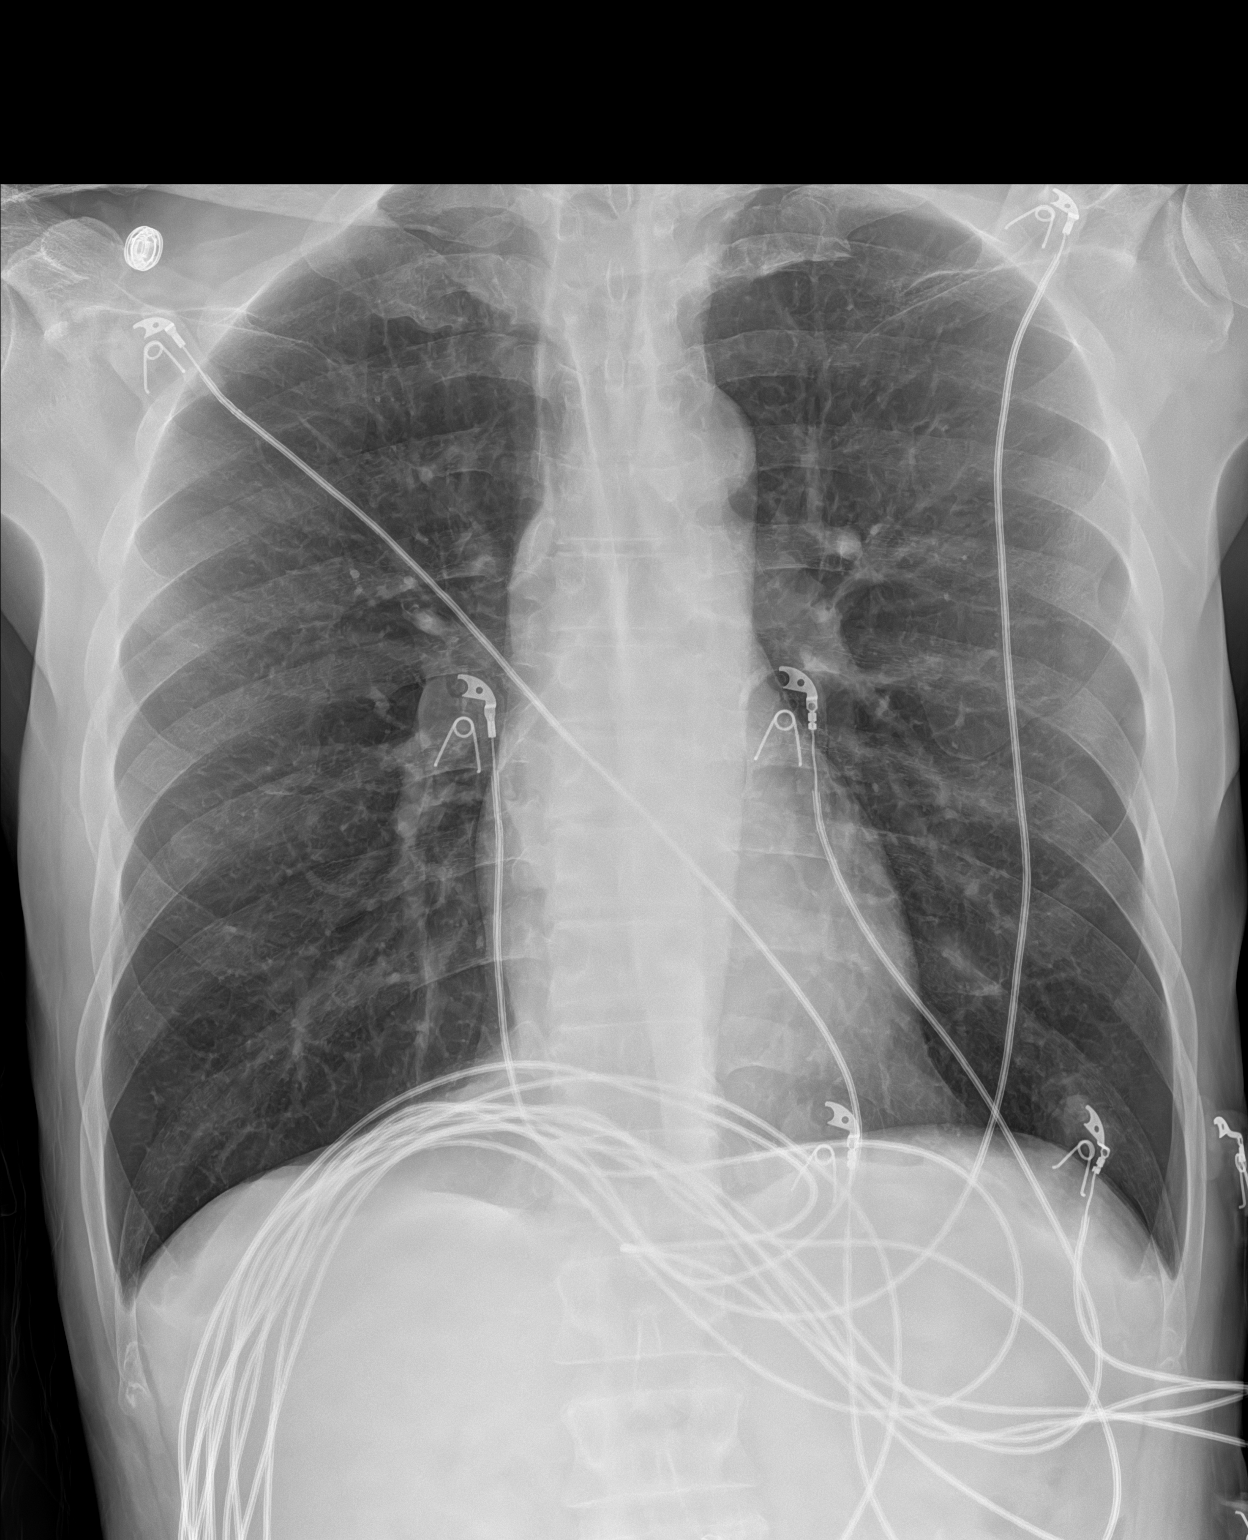

[1 of 1 positions shown; findings below may reference images not displayed]

FINDINGS: Lungs are borderline hyperexpanded, stable. There is no appreciable
edema or airspace opacity. Heart size and pulmonary vascularity are
normal. No adenopathy. No bone lesions.
IMPRESSION: Lungs slightly hyperexpanded but clear. Cardiac silhouette within
normal limits.

## 2019-10-16 IMAGING — MR MR HEAD W/O CM
11 of 12 series · 41 of 48 positions shown · non-contrast
Comparison: MRI head [DATE]

CLINICAL DATA: Acute neuro deficit.

EXAM:
MRI HEAD WITHOUT CONTRAST
TECHNIQUE: Multiplanar, multiecho pulse sequences of the brain and surrounding
structures were obtained without intravenous contrast.

[Series 5: DWI · axial · 4.0mm · 0.88mm/px · z∈[-89,+51]mm · 5 of 36 slices shown (1 of 6)]
[im 1/36]
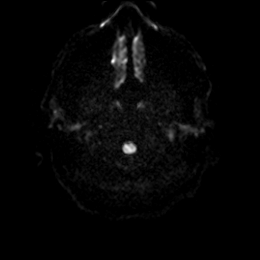
[im 9/36]
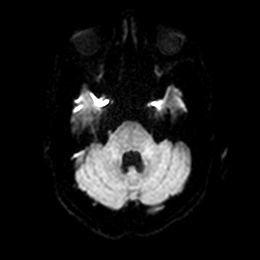
[im 18/36]
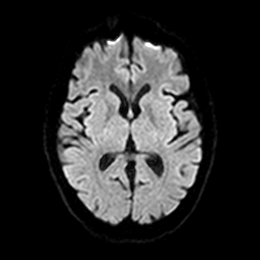
[im 27/36]
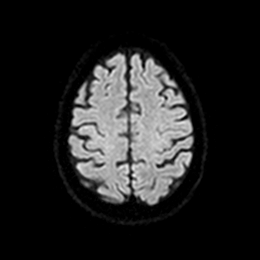
[im 36/36]
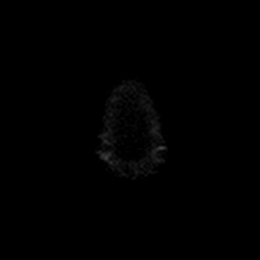

[Series 5: DWI · axial · 4.0mm · 0.88mm/px · z∈[-89,+51]mm · 5 of 36 slices shown (2 of 6)]
[im 1/36]
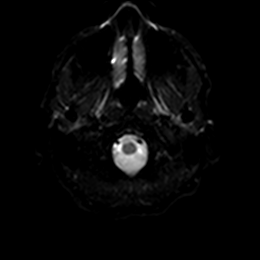
[im 9/36]
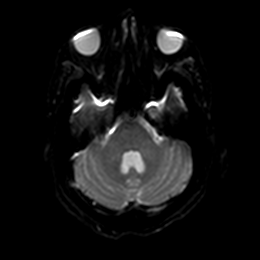
[im 18/36]
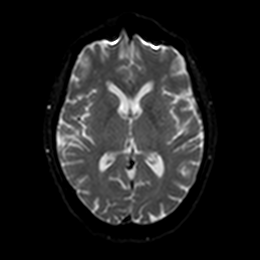
[im 27/36]
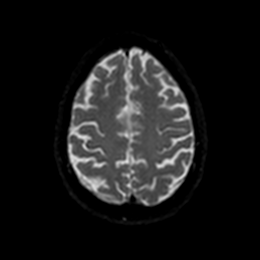
[im 36/36]
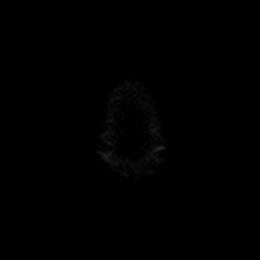

[Series 6: DWI · axial · 4.0mm · 0.88mm/px · z∈[-89,+51]mm · 4 of 36 slices shown (3 of 6)]
[im 1/36]
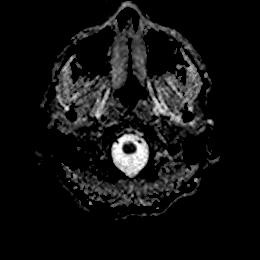
[im 12/36]
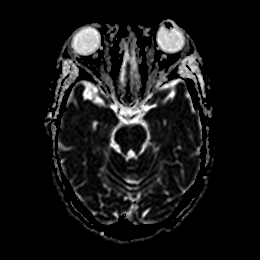
[im 24/36]
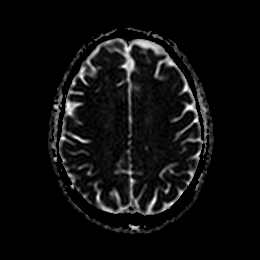
[im 36/36]
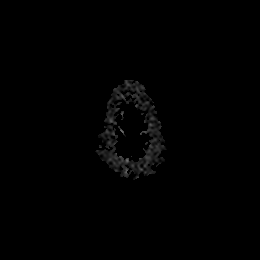

[Series 7: DWI · coronal · 4.0mm · 0.88mm/px · 4 of 32 slices shown (4 of 6)]
[im 1/32]
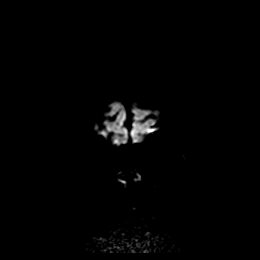
[im 11/32]
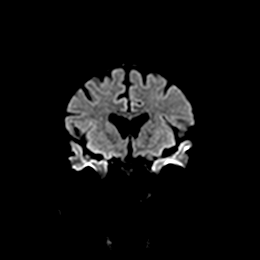
[im 21/32]
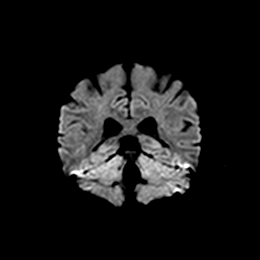
[im 32/32]
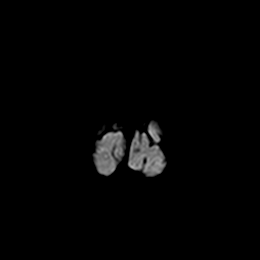

[Series 7: DWI · coronal · 4.0mm · 0.88mm/px · 4 of 32 slices shown (5 of 6)]
[im 1/32]
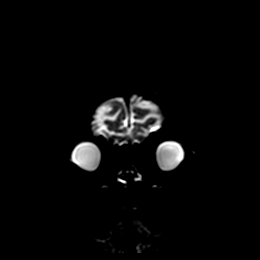
[im 11/32]
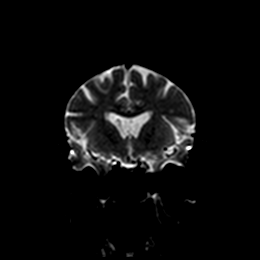
[im 21/32]
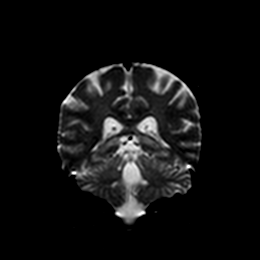
[im 32/32]
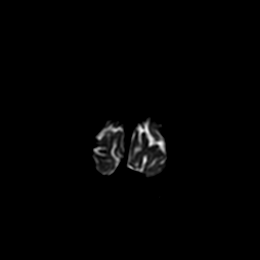

[Series 8: DWI · coronal · 4.0mm · 0.88mm/px · 4 of 32 slices shown (6 of 6)]
[im 1/32]
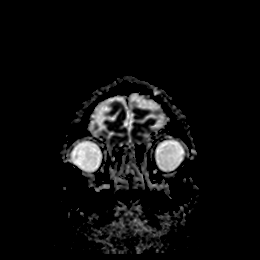
[im 11/32]
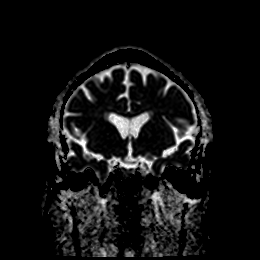
[im 21/32]
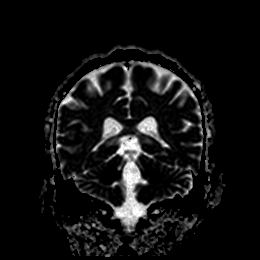
[im 32/32]
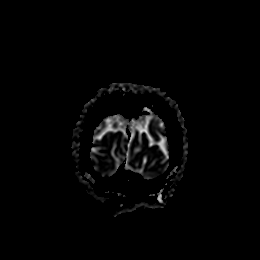

[Series 9: T1 · sagittal · 5.0mm · 0.94mm/px · 3 of 25 slices shown]
[im 1/25]
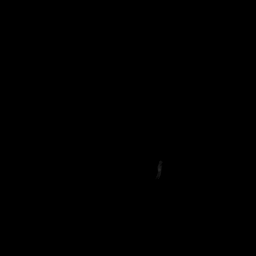
[im 13/25]
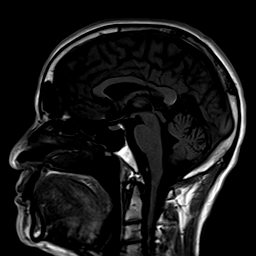
[im 25/25]
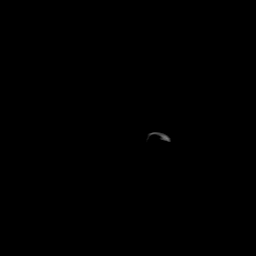

[Series 10: T2 · axial · 5.0mm · 0.72mm/px · z∈[-85,+48]mm · 2 of 20 slices shown (1 of 2)]
[im 1/20]
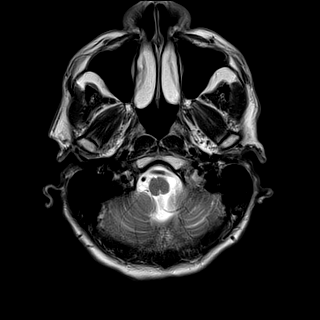
[im 20/20]
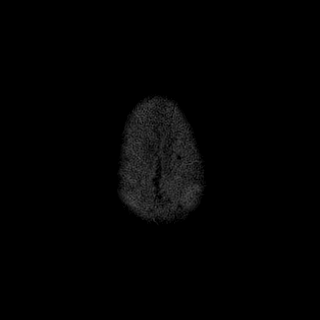

[Series 11: ax hemo · axial · 5.0mm · 0.86mm/px · z∈[-90,+54]mm · 3 of 25 slices shown]
[im 1/25]
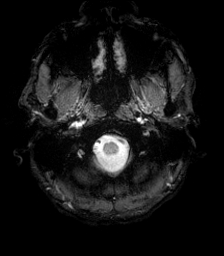
[im 13/25]
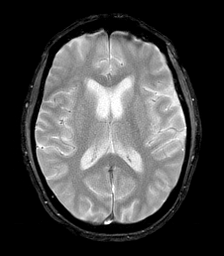
[im 25/25]
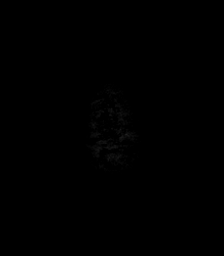

[Series 12: FLAIR · axial · 4.0mm · 0.43mm/px · z∈[-80,+44]mm · 4 of 32 slices shown]
[im 1/32]
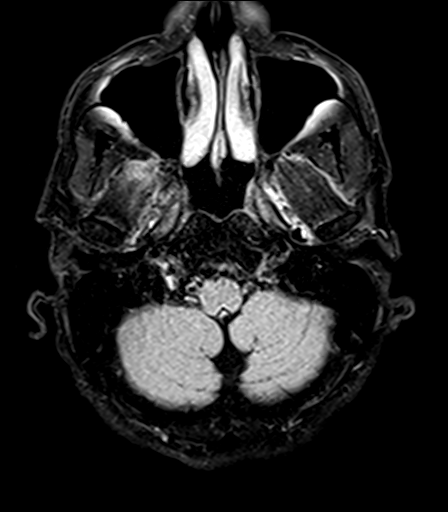
[im 11/32]
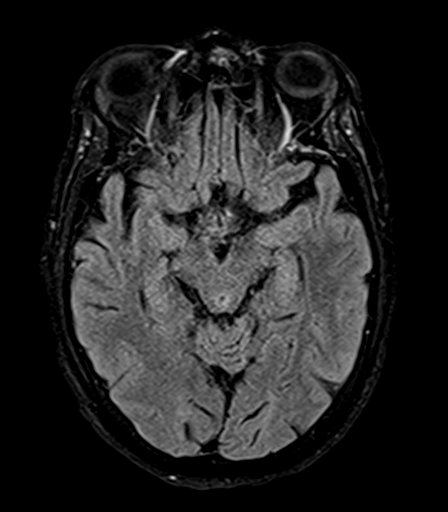
[im 21/32]
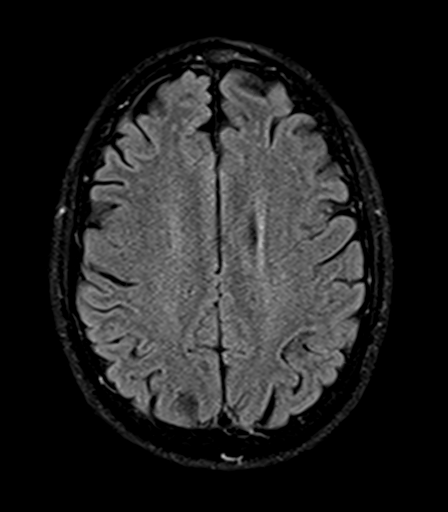
[im 32/32]
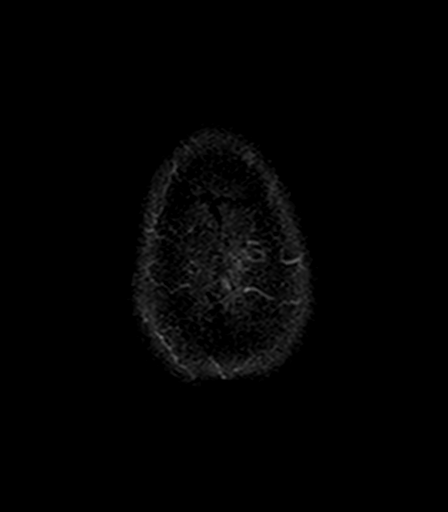

[Series 14: T2 · coronal · 5.0mm · 0.72mm/px · 3 of 28 slices shown (2 of 2)]
[im 1/28]
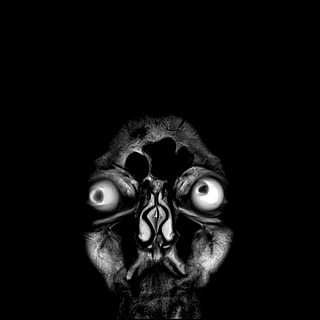
[im 14/28]
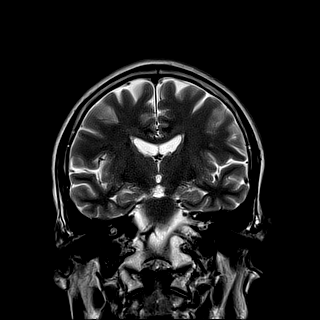
[im 28/28]
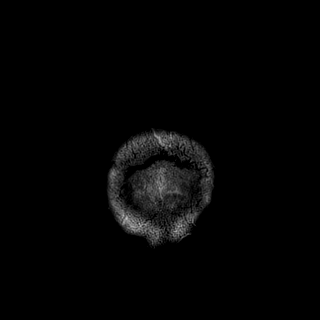

[41 of 48 positions shown; findings below may reference images not displayed]

FINDINGS: Brain: No acute infarction, hemorrhage, hydrocephalus, extra-axial
collection or mass lesion. Scattered small deep white matter
hyperintensities bilaterally better seen on the current study. Prior
study degraded by motion. Brainstem and cerebellum normal.

Vascular: Normal arterial flow voids.

Skull and upper cervical spine: Negative

Sinuses/Orbits: Negative

Other: None
IMPRESSION: No acute abnormality. Mild white matter changes likely due to
chronic microvascular ischemia.

## 2019-10-16 MED ORDER — METOPROLOL TARTRATE 5 MG/5ML IV SOLN
5.0000 mg | Freq: Once | INTRAVENOUS | Status: AC
  Administered 2019-10-16: 5 mg via INTRAVENOUS
  Filled 2019-10-16: qty 5

## 2019-10-16 MED ORDER — SODIUM CHLORIDE 0.9 % IV BOLUS
500.0000 mL | Freq: Once | INTRAVENOUS | Status: AC
  Administered 2019-10-16: 500 mL via INTRAVENOUS

## 2019-10-16 MED ORDER — DEXTROSE 50 % IV SOLN
50.0000 mL | Freq: Once | INTRAVENOUS | Status: AC
  Administered 2019-10-16: 50 mL via INTRAVENOUS
  Filled 2019-10-16: qty 50

## 2019-10-16 MED ORDER — SODIUM CHLORIDE 0.9 % IV BOLUS
1000.0000 mL | Freq: Once | INTRAVENOUS | Status: AC
  Administered 2019-10-16: 1000 mL via INTRAVENOUS

## 2019-10-16 NOTE — ED Triage Notes (Addendum)
Pt presents to ED. Pt states he doesn't remember anything from last night. He states he remembers laying down on the bed, woke up and was still fully dressed. Pt states he remembers cooking and watching football, forgot to eat and forgot his medication. Pt denies weakness on either side. Pt oriented x 4. LKW unknown

## 2019-10-16 NOTE — Discharge Instructions (Addendum)
   Syncope Syncope is when you pass out (faint) for a short time. It is caused by a sudden decrease in blood flow to the brain. Signs that you may be about to pass out include:  Feeling dizzy or light-headed.  Feeling sick to your stomach (nauseous).  Seeing all white or all black.  Having cold, clammy skin. If you pass out, get help right away. Call your local emergency services (911 in the [place] Do not drive yourself to the hospital. Follow these instructions at home: Watch for any changes in your symptoms. Take these actions to stay safe and help with your symptoms: Lifestyle  Do not drive, use machinery, or play sports until your doctor says it is okay.  Do not drink alcohol.  Do not use any products that contain nicotine or tobacco, such as cigarettes and e-cigarettes. If you need help quitting, ask your doctor.  Drink enough fluid to keep your pee (urine) pale yellow. General instructions  Take over-the-counter and prescription medicines only as told by your doctor.  If you are taking blood pressure or heart medicine, sit up and stand up slowly. Spend a few minutes getting ready to sit and then stand. This can help you feel less dizzy.  Have someone stay with you until you feel stable.  If you start to feel like you might pass out, lie down right away and raise (elevate) your feet above the level of your heart. Breathe deeply and steadily. Wait until all of the symptoms are gone.  Keep all follow-up visits as told by your doctor. This is important. Get help right away if:  You have a very bad headache.  You pass out once or more than once.  You have pain in your chest, belly, or back.  You have a very fast or uneven heartbeat (palpitations).  It hurts to breathe.  You are bleeding from your mouth or your bottom (rectum).  You have black or tarry poop (stool).  You have jerky movements that you cannot control (seizure).  You are confused.  You have trouble  walking.  You are very weak.  You have vision problems. These symptoms may be an emergency. Do not wait to see if the symptoms will go away. Get medical help right away. Call your local emergency services (911 in the [place] Do not drive yourself to the hospital. Summary  Syncope is when you pass out (faint) for a short time. It is caused by a sudden decrease in blood flow to the brain.  Signs that you may be about to faint include feeling dizzy, light-headed, or sick to your stomach, seeing all white or all black, or having cold, clammy skin.  If you start to feel like you might pass out, lie down right away and raise (elevate) your feet above the level of your heart. Breathe deeply and steadily. Wait until all of the symptoms are gone. This information is not intended to replace advice given to you by your health care provider. Make sure you discuss any questions you have with your health care provider. Document Revised: 03/03/2017 Document Reviewed: 03/03/2017 Elsevier Patient Education  2020 [place]. Decrease your Glucotrol so you are taking half the amount he usually take.  Drink plenty of fluids and follow-up with your doctor this week

## 2019-10-16 NOTE — ED Notes (Signed)
Nsg Discharge Note  Admit Date:  10/16/2019 Discharge date: 10/16/2019   Edwyna Perfect to be D/C'd home per MD order.  AVS completed.  Copy for chart, and copy for patient signed, and dated. Patient/caregiver able to verbalize understanding.  Discharge Medication:   Discharge Assessment: Vitals:   10/16/19 1400 10/16/19 1417  BP:  129/77  Pulse:    Resp: (!) 31 18  Temp:    SpO2:     Skin clean, dry and intact without evidence of skin break down, no evidence of skin tears noted. IV catheter discontinued intact. Site without signs and symptoms of complications - no redness or edema noted at insertion site, patient denies c/o pain - only slight tenderness at site.  Dressing with slight pressure applied.  D/c Instructions-Education: Discharge instructions given to patient/family with verbalized understanding. D/c education completed with patient/family including follow up instructions, medication list, d/c activities limitations if indicated, with other d/c instructions as indicated by MD - patient able to verbalize understanding, all questions fully answered. Patient instructed to return to ED, call 911, or call MD for any changes in condition.  Patient escorted via [place], and D/C home via private auto.  Felicie Morn, RN 10/16/2019 2:45 PM

## 2019-10-16 NOTE — ED Provider Notes (Signed)
[facility] EMERGENCY DEPARTMENT Provider Note   CSN: [id number] Arrival date & time: 10/16/19  1104     History Chief Complaint  Patient presents with  . Altered Mental Status    Alec Bruce is a 64 y.o. male.  Patient had a syncopal episode last night.  He laid down on the bed and fell asleep accidentally and did not wake up till this morning.  He felt weak today.  He took his diabetes medicine last night but did not eat  The history is provided by the patient. No language interpreter was used.  Weakness Severity:  Mild Onset quality:  Sudden Timing:  Unable to specify Progression:  Resolved Chronicity:  New Context: not alcohol use   Relieved by:  Nothing Worsened by:  Nothing Ineffective treatments:  None tried Associated symptoms: no abdominal pain, no chest pain, no cough, no diarrhea, no frequency, no headaches and no seizures   Risk factors: no anemia        Past Medical History:  Diagnosis Date  . Cancer ([place])    colon cancer 90s  . Diabetes mellitus without complication ([place])   . Hypertension   . Stroke [facility])    TIA    Patient Active Problem List   Diagnosis Date Noted  . AKI (acute kidney injury) ([place]) 03/31/2019  . Uncontrolled type 2 diabetes mellitus with hyperglycemia, without long-term current use of insulin ([place]) 03/21/2019  . TIA (transient ischemic attack) 03/20/2019    Past Surgical History:  Procedure Laterality Date  . APPENDECTOMY         No family history on file.  Social History   Tobacco Use  . Smoking status: Former Research scientist (life sciences)  . Smokeless tobacco: Never Used  Vaping Use  . Vaping Use: Never used  Substance Use Topics  . Alcohol use: Yes    Comment: occ  . Drug use: Not Currently    Home Medications Prior to Admission medications   Medication Sig Start Date End Date Taking? Authorizing Provider  acetaminophen (TYLENOL) 325 MG tablet Take 2 tablets (650 mg total) by mouth every 6 (six) hours as needed for mild  pain (or Fever >/= 101). 04/01/19  Yes Roxan Hockey, MD  aspirin 325 MG tablet Take 1 tablet (325 mg total) by mouth daily with breakfast. For stroke prevention 04/02/19  Yes Emokpae, Courage, MD  atorvastatin (LIPITOR) 40 MG tablet Take 1 tablet (40 mg total) by mouth every evening. For stroke prevention 04/01/19  Yes Roxan Hockey, MD  Cholecalciferol ([employer] VIT D 5000 HIGH-POTENCY PO) Take 5,000 mcg by mouth daily. 08/30/19  Yes [provider]  DULoxetine (CYMBALTA) 60 MG capsule Take 60 mg by mouth at bedtime.   Yes [provider]  ferrous sulfate 325 (65 FE) MG tablet Take 325 mg by mouth daily with breakfast.   Yes [provider]  glipiZIDE (GLUCOTROL) 10 MG tablet Take 1 tablet (10 mg total) by mouth 2 (two) times daily before a meal. 04/01/19  Yes Emokpae, Courage, MD  metoprolol tartrate (LOPRESSOR) 25 MG tablet Take 0.5 tablets (12.5 mg total) by mouth 2 (two) times daily. 04/01/19  Yes Emokpae, Courage, MD  ofloxacin (OCUFLOX) 0.3 % ophthalmic solution Place into the left eye 4 (four) times daily. For 7 days. 03/17/19  Yes [provider]  polyethylene glycol (MIRALAX / GLYCOLAX) 17 g packet Take 17 g by mouth daily as needed for mild constipation. 04/01/19  Yes Emokpae, Courage, MD  prednisoLONE acetate (PRED FORTE) 1 %  ophthalmic suspension Place 1 drop into the left eye See admin instructions. Place 1 drop into the left eye 3 times daily for 1 week, then 2 times daily for 1 week, then once daily for 1 week. 03/17/19  Yes [provider]  sitaGLIPtin (JANUVIA) 100 MG tablet Take 1 tablet (100 mg total) by mouth daily. 04/01/19  Yes Roxan Hockey, MD    Allergies    Patient has no known allergies.  Review of Systems   Review of Systems  Constitutional: Negative for appetite change and fatigue.  HENT: Negative for congestion, ear discharge and sinus pressure.   Eyes: Negative for discharge.  Respiratory: Negative for cough.     Cardiovascular: Negative for chest pain.  Gastrointestinal: Negative for abdominal pain and diarrhea.  Genitourinary: Negative for frequency and hematuria.  Musculoskeletal: Negative for back pain.  Skin: Negative for rash.  Neurological: Positive for weakness. Negative for seizures and headaches.       Syncope  Psychiatric/Behavioral: Negative for hallucinations.    Physical Exam Updated Vital Signs BP 129/77 (BP Location: Left Arm)   Pulse (!) 115   Temp 98.3 F (36.8 C) (Oral)   Resp 18   Ht 6' (1.829 m)   Wt 59 kg   SpO2 100%   BMI 17.63 kg/m   Physical Exam Vitals and nursing note reviewed.  Constitutional:      Appearance: He is well-developed.  HENT:     Head: Normocephalic.     Nose: Nose normal.  Eyes:     General: No scleral icterus.    Conjunctiva/sclera: Conjunctivae normal.  Neck:     Thyroid: No thyromegaly.  Cardiovascular:     Rate and Rhythm: Normal rate and regular rhythm.     Heart sounds: No murmur heard.  No friction rub. No gallop.   Pulmonary:     Breath sounds: No stridor. No wheezing or rales.  Chest:     Chest wall: No tenderness.  Abdominal:     General: There is no distension.     Tenderness: There is no abdominal tenderness. There is no rebound.  Musculoskeletal:        General: Normal range of motion.     Cervical back: Neck supple.  Lymphadenopathy:     Cervical: No cervical adenopathy.  Skin:    Findings: No erythema or rash.  Neurological:     Mental Status: He is alert and oriented to person, place, and time.     Motor: No abnormal muscle tone.     Coordination: Coordination normal.  Psychiatric:        Behavior: Behavior normal.     ED Results / Procedures / Treatments   Labs (all labs ordered are listed, but only abnormal results are displayed) Labs Reviewed  COMPREHENSIVE METABOLIC PANEL - Abnormal; Notable for the following components:      Result Value   CO2 19 (*)    Glucose, Bld 63 (*)    BUN 39 (*)     Creatinine, Ser 1.69 (*)    GFR calc non Af Amer 42 (*)    GFR calc Af Amer 49 (*)    All other components within normal limits  CBC - Abnormal; Notable for the following components:   RBC 3.94 (*)    Hemoglobin 11.9 (*)    HCT 36.8 (*)    All other components within normal limits  CBG MONITORING, ED - Abnormal; Notable for the following components:   Glucose-Capillary 43 (*)  All other components within normal limits  CBG MONITORING, ED - Abnormal; Notable for the following components:   Glucose-Capillary 210 (*)    All other components within normal limits  TROPONIN I (HIGH SENSITIVITY)  TROPONIN I (HIGH SENSITIVITY)    EKG None  Radiology MR BRAIN WO CONTRAST  Result Date: 10/16/2019 CLINICAL DATA:  Acute neuro deficit. EXAM: MRI HEAD WITHOUT CONTRAST TECHNIQUE: Multiplanar, multiecho pulse sequences of the brain and surrounding structures were obtained without intravenous contrast. COMPARISON:  MRI head 03/21/2019 FINDINGS: Brain: No acute infarction, hemorrhage, hydrocephalus, extra-axial collection or mass lesion. Scattered small deep white matter hyperintensities bilaterally better seen on the current study. Prior study degraded by motion. Brainstem and cerebellum normal. Vascular: Normal arterial flow voids. Skull and upper cervical spine: Negative Sinuses/Orbits: Negative Other: None IMPRESSION: No acute abnormality. Mild white matter changes likely due to chronic microvascular ischemia. Electronically Signed   By: Franchot Gallo M.D.   On: 10/16/2019 12:40   DG Chest Port 1 View  Result Date: 10/16/2019 CLINICAL DATA:  Altered mental status and weakness EXAM: PORTABLE CHEST 1 VIEW COMPARISON:  March 20, 2019 FINDINGS: Lungs are borderline hyperexpanded, stable. There is no appreciable edema or airspace opacity. Heart size and pulmonary vascularity are normal. No adenopathy. No bone lesions. IMPRESSION: Lungs slightly hyperexpanded but clear. Cardiac silhouette within  normal limits. Electronically Signed   By: Lowella Grip III M.D.   On: 10/16/2019 12:20    Procedures Procedures (including critical care time)  Medications Ordered in ED Medications  sodium chloride 0.9 % bolus 500 mL (0 mLs Intravenous Stopped 10/16/19 1243)  dextrose 50 % solution 50 mL (50 mLs Intravenous Given 10/16/19 1312)  sodium chloride 0.9 % bolus 1,000 mL (1,000 mLs Intravenous New Bag/Given 10/16/19 1408)  metoprolol tartrate (LOPRESSOR) injection 5 mg (5 mg Intravenous Given 10/16/19 1408)    ED Course  I have reviewed the triage vital signs and the nursing notes.  Pertinent labs & imaging results that were available during my care of the patient were reviewed by me and considered in my medical decision making (see chart for details).    MDM Rules/Calculators/A&P                          Patient with dehydration and weakness.  Patient improved with fluids and food.  MRI did not show a stroke     This patient presents to the ED for concern of syncope this involves an extensive number of treatment options, and is a complaint that carries with it a high risk of complications and morbidity.  The differential diagnosis includes dehydration and hypoglycemia  Lab Tests:   I Ordered, reviewed, and interpreted labs, which included CBC and chemistries which showed low glucose and elevated BUN with a mild anemia 11.9  Medicines ordered:   I ordered medication normal saline and glucose  Imaging Studies ordered:   I ordered imaging studies which included MRI of the brain and chest x-ray  I independently visualized and interpreted imaging which showed no acute disease  Additional history obtained:   Additional history obtained from record  Previous records obtained and reviewed.  Consultations Obtained:   imaging findings  Reevaluation:  After the interventions stated above, I reevaluated the patient and found much improved  Critical  Interventions:  .   Final Clinical Impression(s) / ED Diagnoses Final diagnoses:  Syncope, unspecified syncope type    Rx / DC Orders ED Discharge Orders  None       Milton Ferguson, MD 10/18/19 1807

## 2020-02-03 HISTORY — PX: CATARACT EXTRACTION W/ INTRAOCULAR LENS IMPLANT: SHX1309

## 2020-08-01 ENCOUNTER — Emergency Department (HOSPITAL_COMMUNITY): Payer: Self-pay

## 2020-08-01 ENCOUNTER — Inpatient Hospital Stay (HOSPITAL_COMMUNITY)
Admission: EM | Admit: 2020-08-01 | Discharge: 2020-08-03 | DRG: 683 | Disposition: A | Discharge status: Home / Self Care | Payer: Self-pay | Attending: Family Medicine | Admitting: Family Medicine

## 2020-08-01 ENCOUNTER — Other Ambulatory Visit: Payer: Self-pay

## 2020-08-01 ENCOUNTER — Encounter (HOSPITAL_COMMUNITY): Payer: Self-pay | Admitting: *Deleted

## 2020-08-01 DIAGNOSIS — E440 Moderate protein-calorie malnutrition: Secondary | ICD-10-CM | POA: Diagnosis present

## 2020-08-01 DIAGNOSIS — E785 Hyperlipidemia, unspecified: Secondary | ICD-10-CM | POA: Diagnosis present

## 2020-08-01 DIAGNOSIS — I129 Hypertensive chronic kidney disease with stage 1 through stage 4 chronic kidney disease, or unspecified chronic kidney disease: Secondary | ICD-10-CM | POA: Diagnosis present

## 2020-08-01 DIAGNOSIS — Z8249 Family history of ischemic heart disease and other diseases of the circulatory system: Secondary | ICD-10-CM

## 2020-08-01 DIAGNOSIS — Z23 Encounter for immunization: Secondary | ICD-10-CM

## 2020-08-01 DIAGNOSIS — Z87891 Personal history of nicotine dependence: Secondary | ICD-10-CM

## 2020-08-01 DIAGNOSIS — K8020 Calculus of gallbladder without cholecystitis without obstruction: Secondary | ICD-10-CM | POA: Diagnosis present

## 2020-08-01 DIAGNOSIS — C189 Malignant neoplasm of colon, unspecified: Secondary | ICD-10-CM | POA: Diagnosis present

## 2020-08-01 DIAGNOSIS — Z681 Body mass index (BMI) 19 or less, adult: Secondary | ICD-10-CM

## 2020-08-01 DIAGNOSIS — E875 Hyperkalemia: Secondary | ICD-10-CM

## 2020-08-01 DIAGNOSIS — K5900 Constipation, unspecified: Secondary | ICD-10-CM

## 2020-08-01 DIAGNOSIS — Z85038 Personal history of other malignant neoplasm of large intestine: Secondary | ICD-10-CM

## 2020-08-01 DIAGNOSIS — Z7982 Long term (current) use of aspirin: Secondary | ICD-10-CM

## 2020-08-01 DIAGNOSIS — N1831 Chronic kidney disease, stage 3a: Secondary | ICD-10-CM | POA: Diagnosis present

## 2020-08-01 DIAGNOSIS — E860 Dehydration: Secondary | ICD-10-CM | POA: Diagnosis present

## 2020-08-01 DIAGNOSIS — R1011 Right upper quadrant pain: Secondary | ICD-10-CM

## 2020-08-01 DIAGNOSIS — Z7984 Long term (current) use of oral hypoglycemic drugs: Secondary | ICD-10-CM

## 2020-08-01 DIAGNOSIS — K828 Other specified diseases of gallbladder: Secondary | ICD-10-CM

## 2020-08-01 DIAGNOSIS — Z9049 Acquired absence of other specified parts of digestive tract: Secondary | ICD-10-CM

## 2020-08-01 DIAGNOSIS — E1165 Type 2 diabetes mellitus with hyperglycemia: Secondary | ICD-10-CM | POA: Diagnosis present

## 2020-08-01 DIAGNOSIS — I10 Essential (primary) hypertension: Secondary | ICD-10-CM | POA: Diagnosis present

## 2020-08-01 DIAGNOSIS — Z79899 Other long term (current) drug therapy: Secondary | ICD-10-CM

## 2020-08-01 DIAGNOSIS — Z20822 Contact with and (suspected) exposure to covid-19: Secondary | ICD-10-CM | POA: Diagnosis present

## 2020-08-01 DIAGNOSIS — E1122 Type 2 diabetes mellitus with diabetic chronic kidney disease: Secondary | ICD-10-CM | POA: Diagnosis present

## 2020-08-01 DIAGNOSIS — N179 Acute kidney failure, unspecified: Principal | ICD-10-CM

## 2020-08-01 DIAGNOSIS — Z8673 Personal history of transient ischemic attack (TIA), and cerebral infarction without residual deficits: Secondary | ICD-10-CM

## 2020-08-01 LAB — CBC WITH DIFFERENTIAL/PLATELET
Abs Immature Granulocytes: 0.05 10*3/uL (ref 0.00–0.07)
Basophils Absolute: 0.1 10*3/uL (ref 0.0–0.1)
Basophils Relative: 1 %
Eosinophils Absolute: 0 10*3/uL (ref 0.0–0.5)
Eosinophils Relative: 0 %
HCT: 40.8 % (ref 39.0–52.0)
Hemoglobin: 13.3 g/dL (ref 13.0–17.0)
Immature Granulocytes: 1 %
Lymphocytes Relative: 17 %
Lymphs Abs: 1.2 10*3/uL (ref 0.7–4.0)
MCH: 30.2 pg (ref 26.0–34.0)
MCHC: 32.6 g/dL (ref 30.0–36.0)
MCV: 92.7 fL (ref 80.0–100.0)
Monocytes Absolute: 0.4 10*3/uL (ref 0.1–1.0)
Monocytes Relative: 5 %
Neutro Abs: 5.4 10*3/uL (ref 1.7–7.7)
Neutrophils Relative %: 76 %
Platelets: 273 10*3/uL (ref 150–400)
RBC: 4.4 MIL/uL (ref 4.22–5.81)
RDW: 12.4 % (ref 11.5–15.5)
WBC: 7.1 10*3/uL (ref 4.0–10.5)
nRBC: 0 % (ref 0.0–0.2)

## 2020-08-01 LAB — URINALYSIS, ROUTINE W REFLEX MICROSCOPIC
Bilirubin Urine: NEGATIVE
Glucose, UA: NEGATIVE mg/dL
Hgb urine dipstick: NEGATIVE
Ketones, ur: 20 mg/dL — AB
Leukocytes,Ua: NEGATIVE
Nitrite: NEGATIVE
Protein, ur: NEGATIVE mg/dL
Specific Gravity, Urine: 1.016 (ref 1.005–1.030)
pH: 5 (ref 5.0–8.0)

## 2020-08-01 LAB — COMPREHENSIVE METABOLIC PANEL
ALT: 33 U/L (ref 0–44)
AST: 29 U/L (ref 15–41)
Albumin: 4.6 g/dL (ref 3.5–5.0)
Alkaline Phosphatase: 74 U/L (ref 38–126)
Anion gap: 15 (ref 5–15)
BUN: 94 mg/dL — ABNORMAL HIGH (ref 8–23)
CO2: 15 mmol/L — ABNORMAL LOW (ref 22–32)
Calcium: 9.8 mg/dL (ref 8.9–10.3)
Chloride: 105 mmol/L (ref 98–111)
Creatinine, Ser: 3.39 mg/dL — ABNORMAL HIGH (ref 0.61–1.24)
GFR, Estimated: 19 mL/min — ABNORMAL LOW (ref >60)
Glucose, Bld: 180 mg/dL — ABNORMAL HIGH (ref 70–99)
Potassium: 5.5 mmol/L — ABNORMAL HIGH (ref 3.5–5.1)
Sodium: 135 mmol/L (ref 135–145)
Total Bilirubin: 1.7 mg/dL — ABNORMAL HIGH (ref 0.3–1.2)
Total Protein: 7.9 g/dL (ref 6.5–8.1)

## 2020-08-01 LAB — GLUCOSE, CAPILLARY: Glucose-Capillary: 120 mg/dL — ABNORMAL HIGH (ref 70–99)

## 2020-08-01 LAB — BLOOD GAS, VENOUS
Acid-base deficit: 10.9 mmol/L — ABNORMAL HIGH (ref 0.0–2.0)
Bicarbonate: 14.6 mmol/L — ABNORMAL LOW (ref 20.0–28.0)
FIO2: 21
O2 Saturation: 33.7 %
Patient temperature: 36.6
pCO2, Ven: 36.7 mmHg — ABNORMAL LOW (ref 44.0–60.0)
pH, Ven: 7.237 — ABNORMAL LOW (ref 7.250–7.430)
pO2, Ven: <31 mmHg — CL (ref 32.0–45.0)

## 2020-08-01 LAB — LIPASE, BLOOD: Lipase: 40 U/L (ref 11–51)

## 2020-08-01 IMAGING — CT CT ABD-PELV W/O CM
2 of 4 series · 16 of 46 positions shown, 18 images · non-contrast
Comparison: None.

CLINICAL DATA: Abdominal pain and constipation.

EXAM:
CT ABDOMEN AND PELVIS WITHOUT CONTRAST
TECHNIQUE: Multidetector CT imaging of the abdomen and pelvis was performed
following the standard protocol without IV contrast.

[Series 2: axial st · axial · 0.73mm/px · z∈[-870,-500]mm · 13 of 84 slices shown, 15 images]
[im 5/84  soft-tissue]
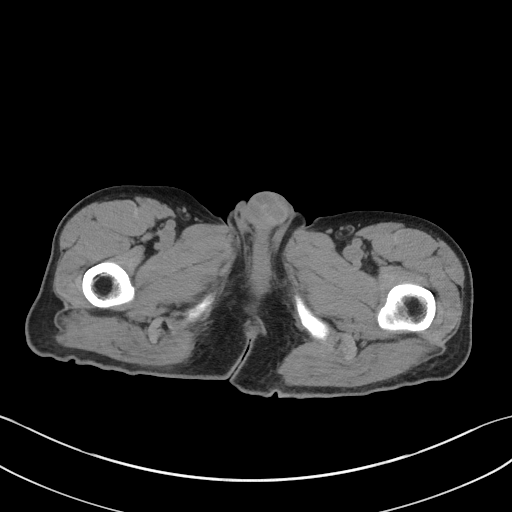
[im 5/84  bone]
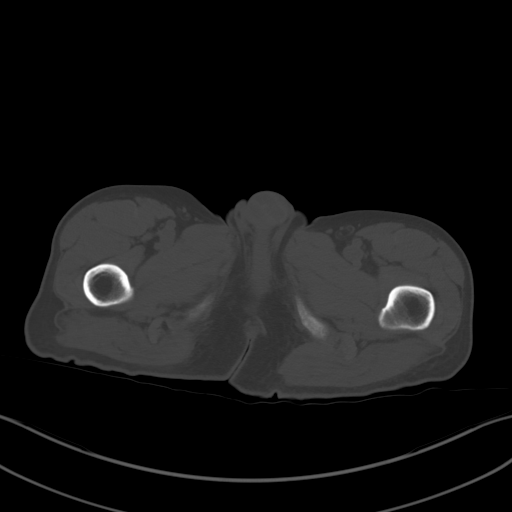
[im 10/84  soft-tissue]
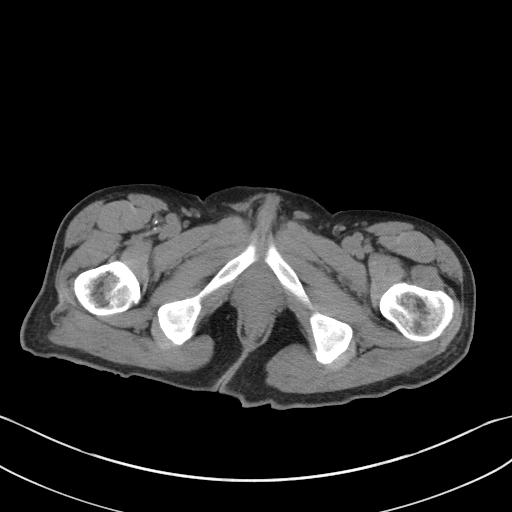
[im 19/84  soft-tissue]
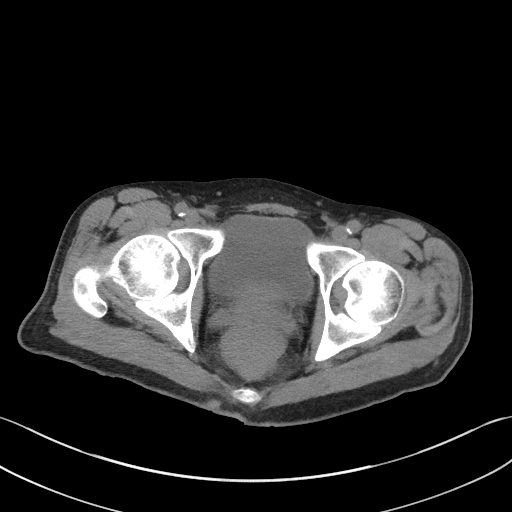
[im 24/84  soft-tissue]
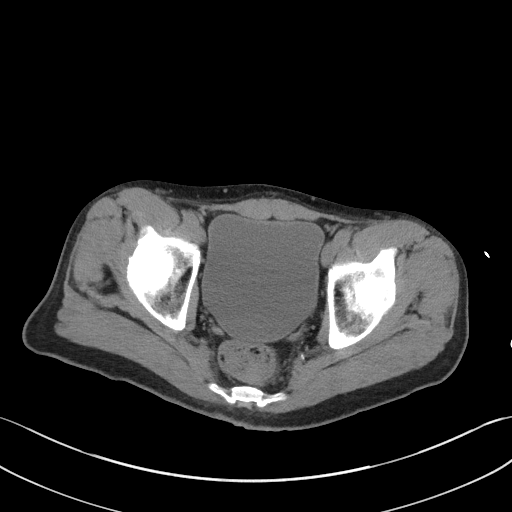
[im 28/84  soft-tissue]
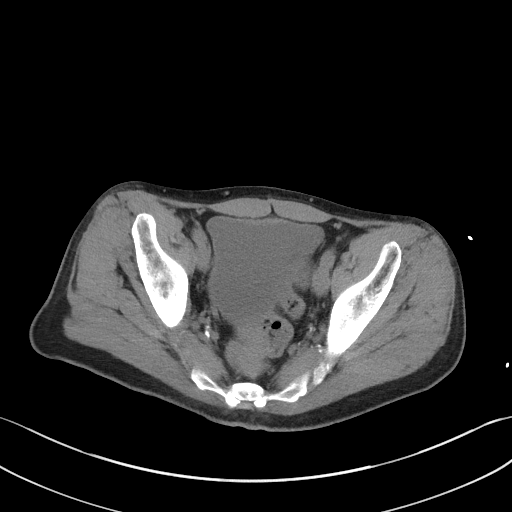
[im 37/84  soft-tissue]
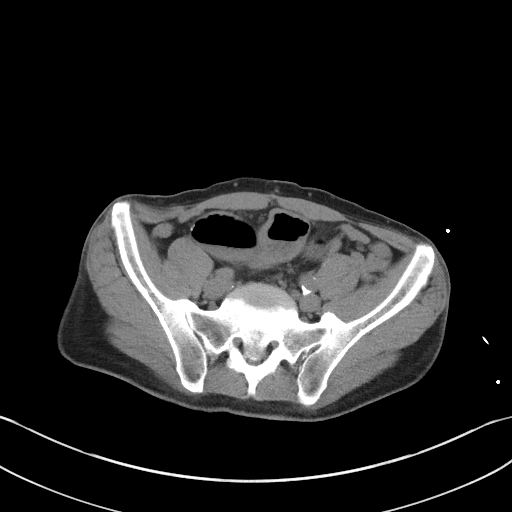
[im 42/84  soft-tissue]
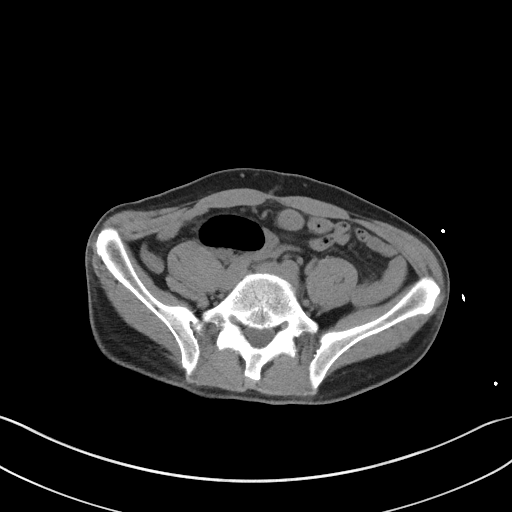
[im 47/84  soft-tissue]
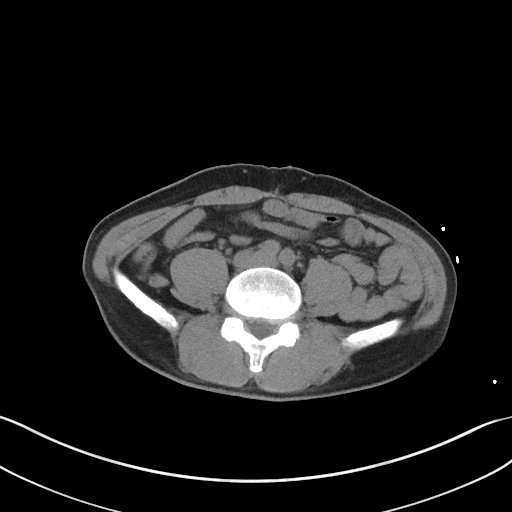
[im 56/84  soft-tissue]
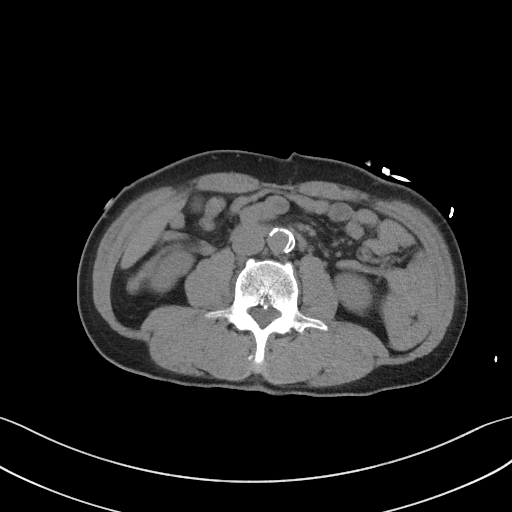
[im 56/84  bone]
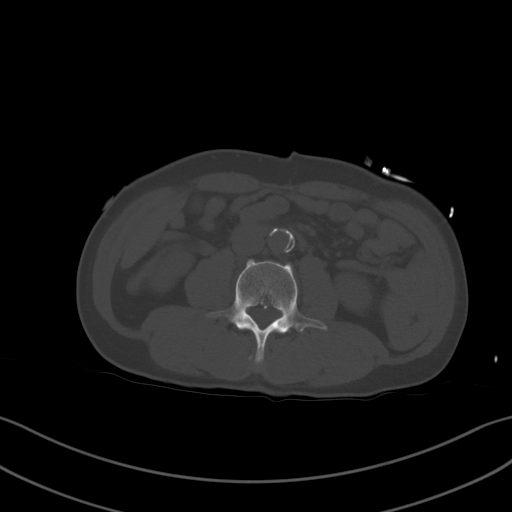
[im 60/84  soft-tissue]
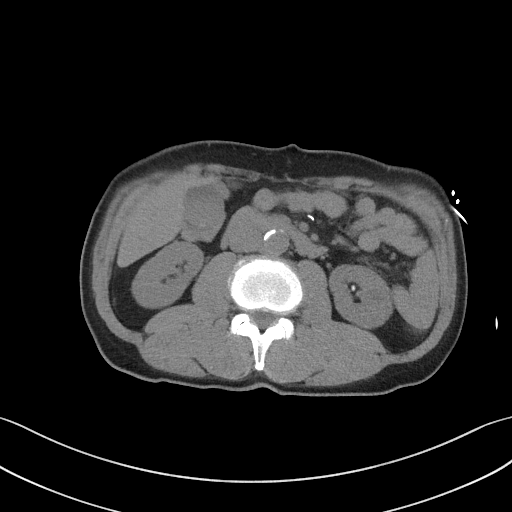
[im 65/84  soft-tissue]
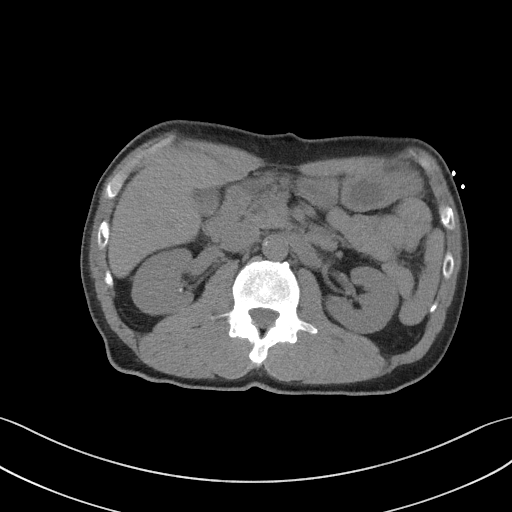
[im 74/84  soft-tissue]
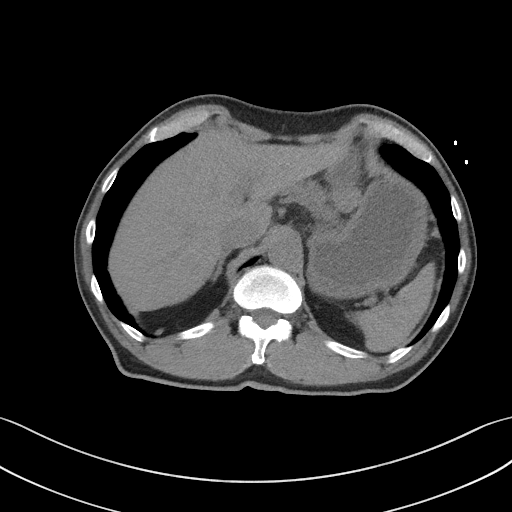
[im 79/84  soft-tissue]
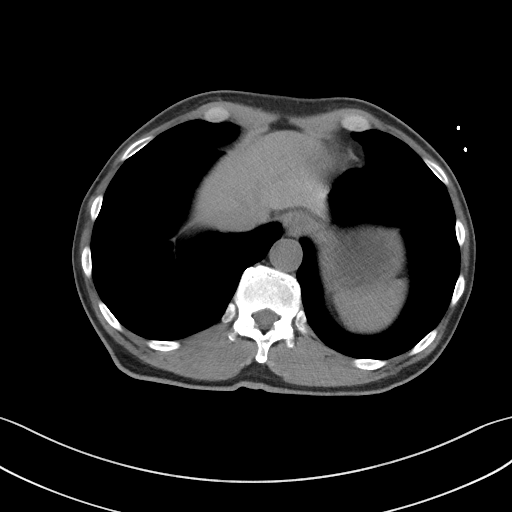

[Series 5: coronal st · coronal · 0.74mm/px · 3 of 98 slices shown]
[im 33/98  soft-tissue]
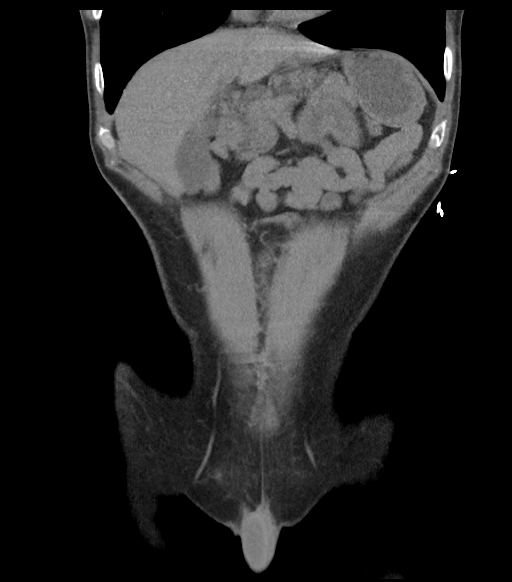
[im 44/98  soft-tissue]
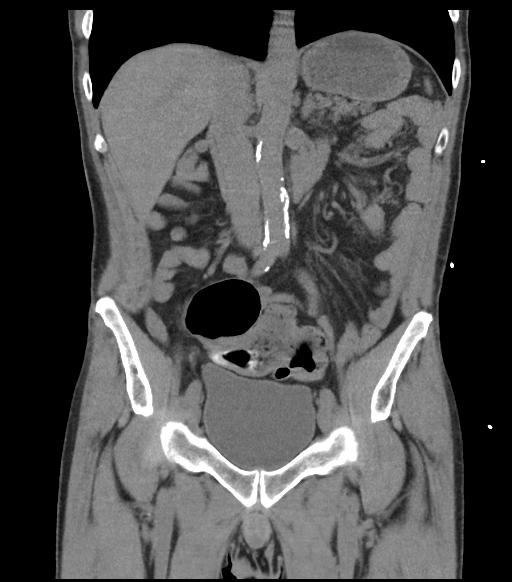
[im 54/98  soft-tissue]
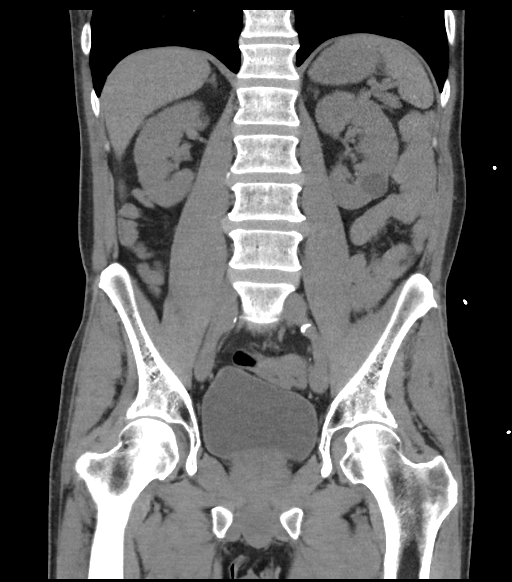

[16 of 46 positions shown; findings below may reference images not displayed]

FINDINGS: Lower chest: No acute abnormality.

Hepatobiliary: No focal liver abnormality is seen. Gallbladder
sludge and a subcentimeter gallstone are seen within the gallbladder
lumen, without evidence of gallbladder wall thickening or biliary
dilatation. A 3 mm calcification is seen adjacent to the
superomedial aspect of the gallbladder (axial CT image 18, CT series
2).

Pancreas: Unremarkable. No pancreatic ductal dilatation or
surrounding inflammatory changes.

Spleen: Normal in size without focal abnormality.

Adrenals/Urinary Tract: Adrenal glands are unremarkable. Kidneys are
normal in size. 2 mm nonobstructing renal stones are seen within the
right kidney. A 6 mm nonobstructing renal stone is seen within the
left kidney. A 2.2 cm x 1.9 cm cyst is noted within the lateral
aspect of the mid to lower left kidney. Bladder is unremarkable.

Stomach/Bowel: Stomach is within normal limits. The appendix is not
identified. A mild amount of stool is seen within the sigmoid colon.
No evidence of bowel wall thickening, distention, or inflammatory
changes.

Vascular/Lymphatic: Aortic atherosclerosis. No enlarged abdominal or
pelvic lymph nodes.

Reproductive: Prostate is unremarkable.

Other: No abdominal wall hernia or abnormality. No abdominopelvic
ascites.

Musculoskeletal: No acute or significant osseous findings.
IMPRESSION: 1. Cholelithiasis and gallbladder sludge with an additional small
calcification adjacent to the gallbladder. A small gallstone within
the common bile duct cannot be excluded. Correlation with right
upper quadrant ultrasound is recommended.
2. Bilateral subcentimeter nonobstructing renal calculi.
3. Minimal stool burden within the distal sigmoid colon.

## 2020-08-01 IMAGING — US US ABDOMEN LIMITED
1 series · 14 of 25 positions shown · non-contrast
Comparison: CT abdomen pelvis dated [DATE].

CLINICAL DATA: 64-year-old male with right upper quadrant abdominal
pain.

EXAM:
ULTRASOUND ABDOMEN LIMITED RIGHT UPPER QUADRANT

[Series 1: us abdomen limited ruq (liver/gb) · 14 of 82 slices shown]
[im 1/82]
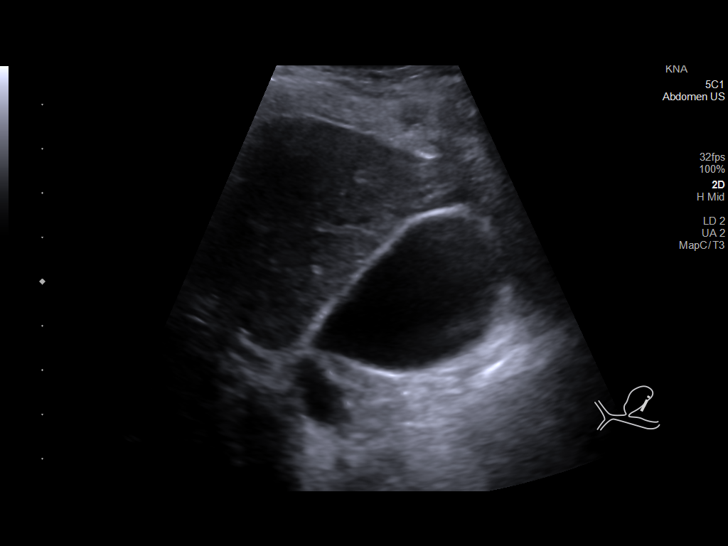
[im 7/82]
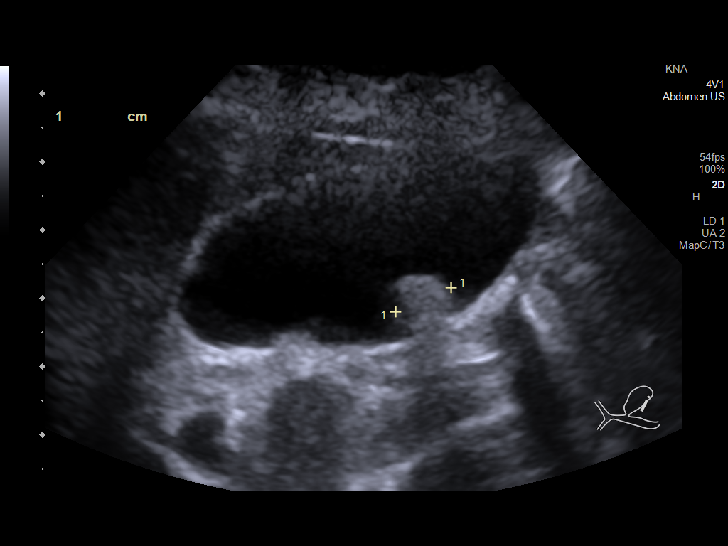
[im 14/82]
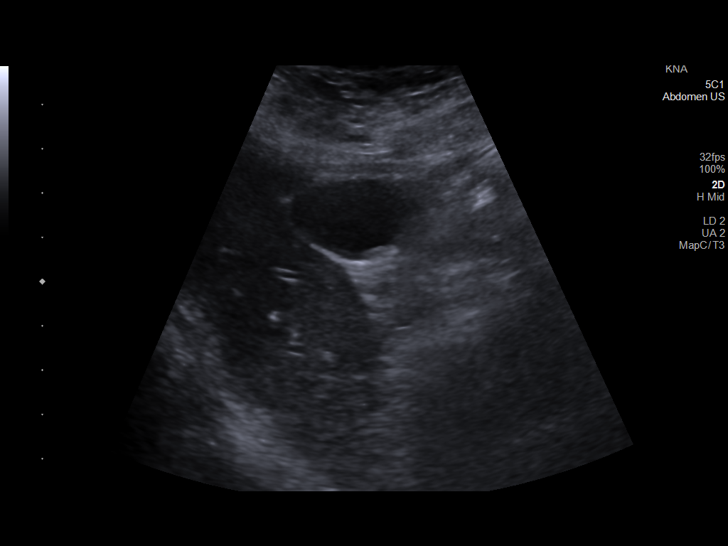
[im 21/82]
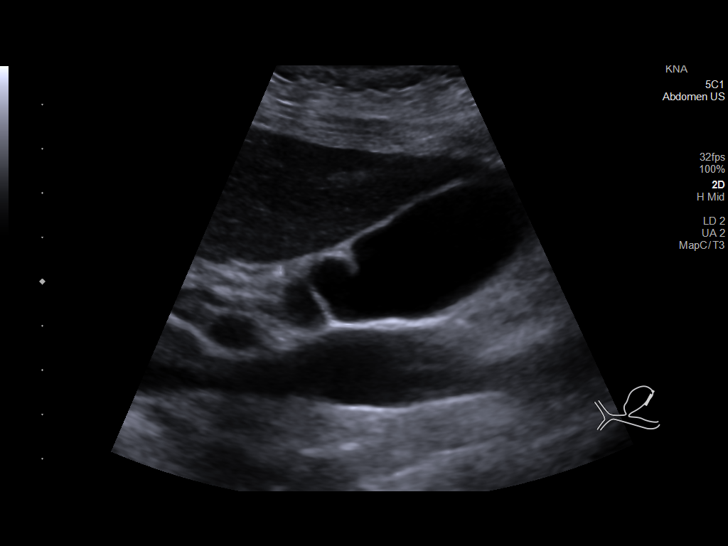
[im 28/82]
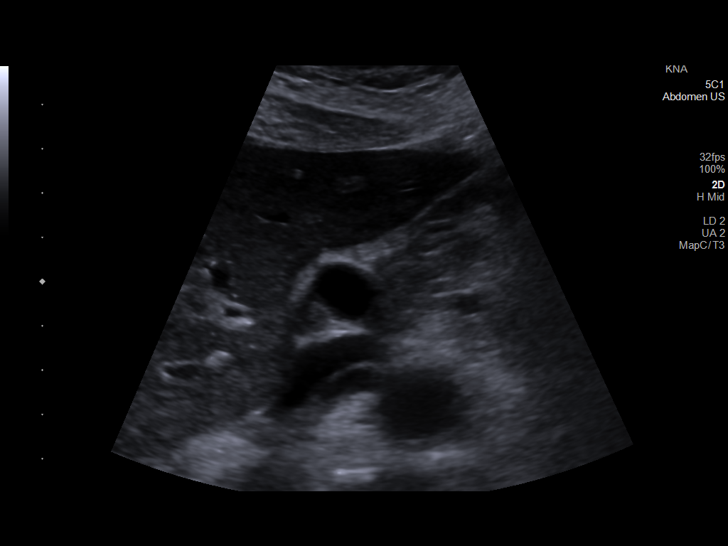
[im 31/82]
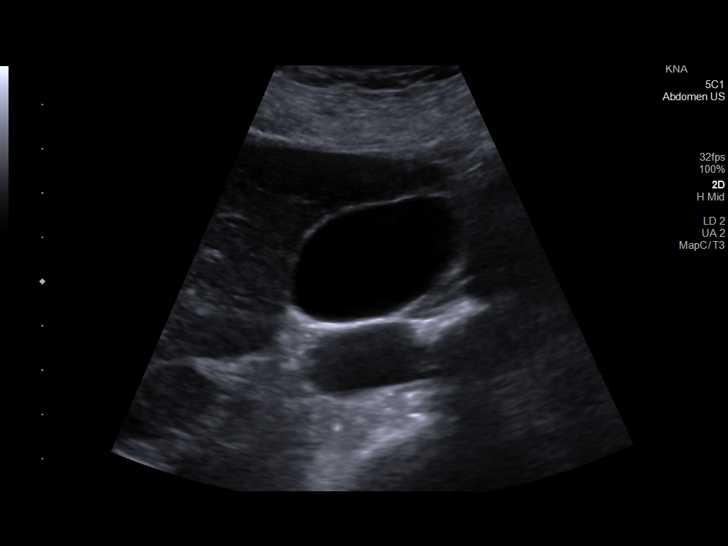
[im 38/82]
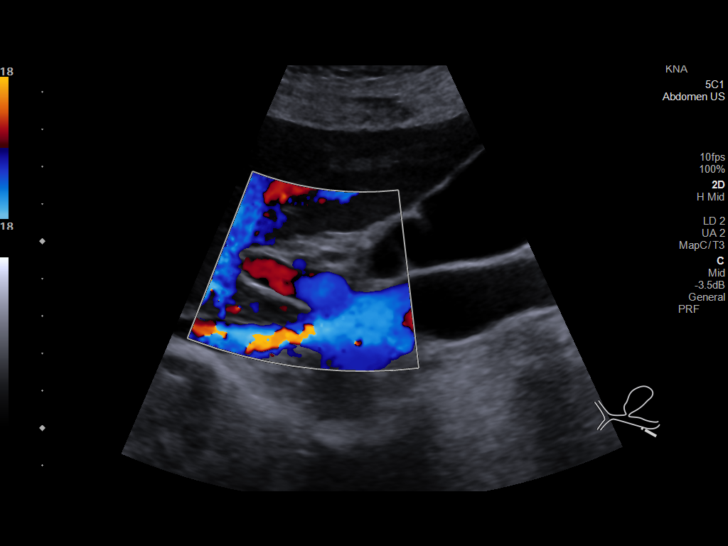
[im 44/82]
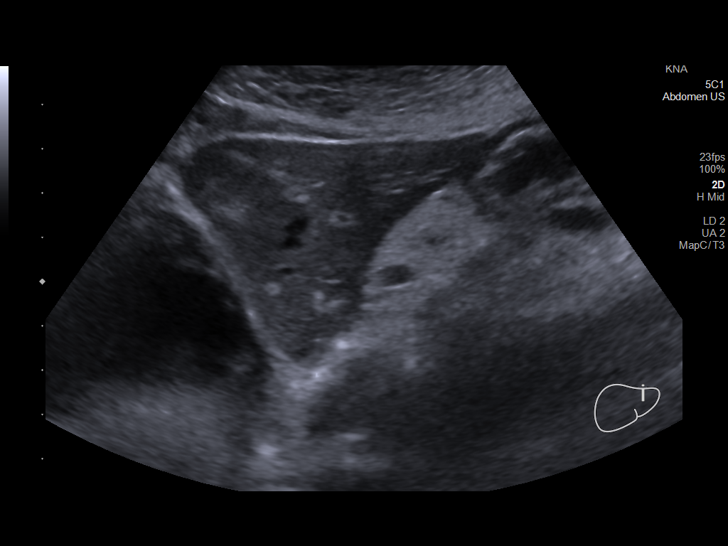
[im 51/82]
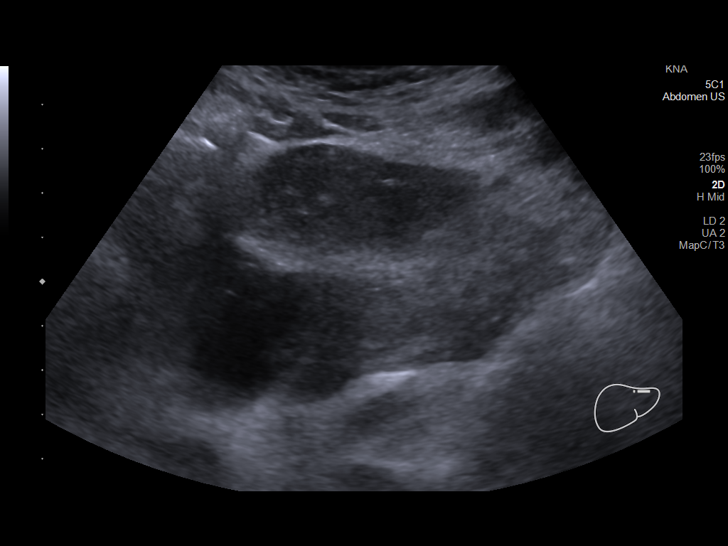
[im 55/82]
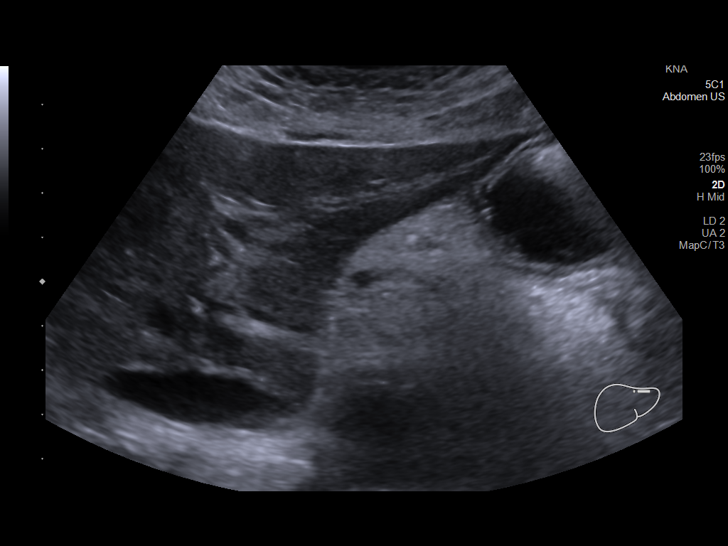
[im 61/82]
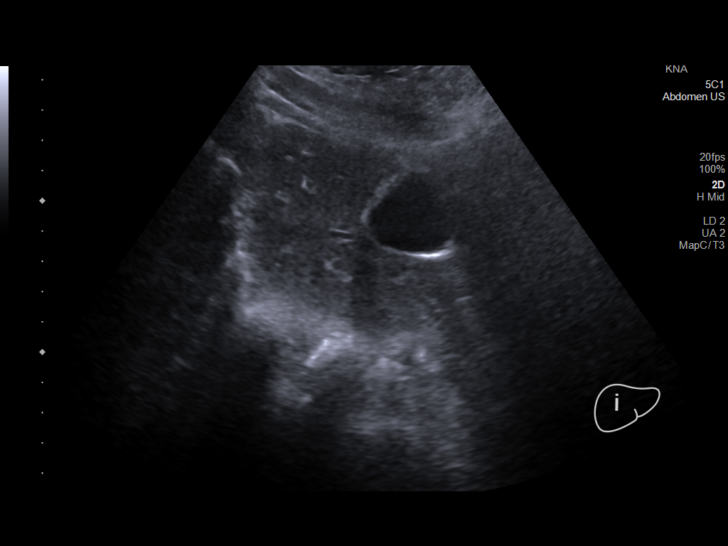
[im 68/82]
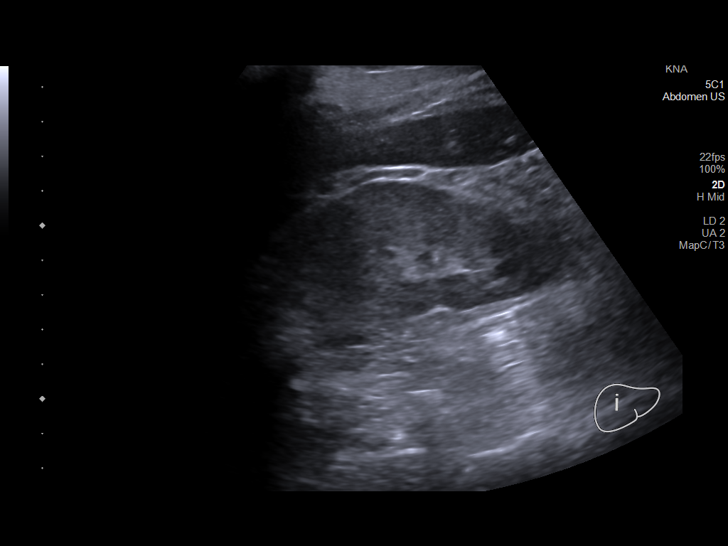
[im 75/82]
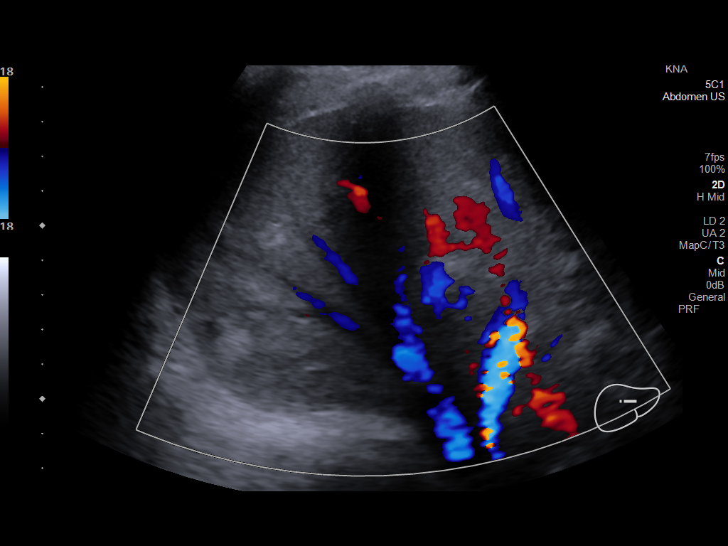
[im 82/82]
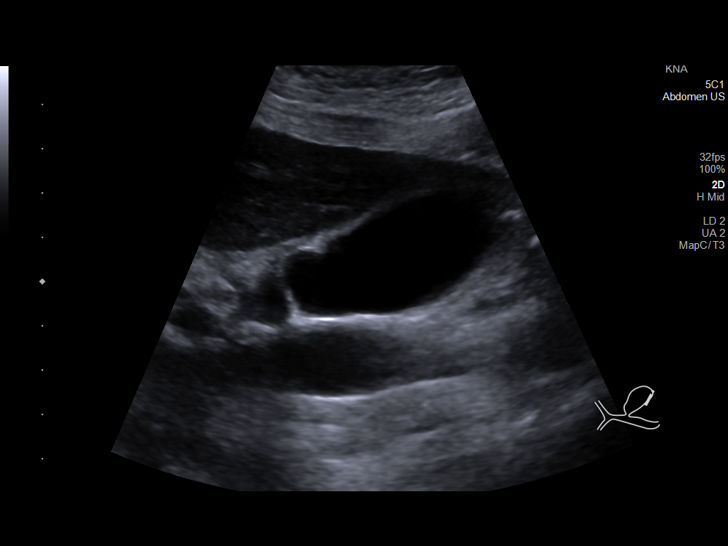

[14 of 25 positions shown; findings below may reference images not displayed]

FINDINGS: Gallbladder:

There is a stone or sludge ball within the gallbladder. No
gallbladder wall thickening or pericholecystic fluid. Negative
sonographic Murphy's sign.

Common bile duct:

Diameter: 3 mm

Liver:

The liver demonstrates a normal echogenicity. Apparent 1 cm
echogenic lesion in the right lobe of the liver ([DATE]
represent confluence of periportal fat. A liver lesion is less
likely but not entirely excluded. Further evaluation with MRI or
follow-up with ultrasound in 3 months recommended. Portal vein is
patent on color Doppler imaging with normal direction of blood flow
towards the liver.

Other: None.
IMPRESSION: 1. Gallbladder sludge or stone. No sonographic findings of acute
cholecystitis.
2. Confluence periportal fat versus less likely an echogenic liver
lesion. Further evaluation with MRI or follow-up with ultrasound in
3 months recommended.

## 2020-08-01 IMAGING — DX DG ABDOMEN ACUTE W/ 1V CHEST
3 series · 3 of 3 positions shown · non-contrast
Comparison: [DATE].

CLINICAL DATA: Abdominal pain, constipation.

EXAM:
DG ABDOMEN ACUTE WITH 1 VIEW CHEST

[chest ap]
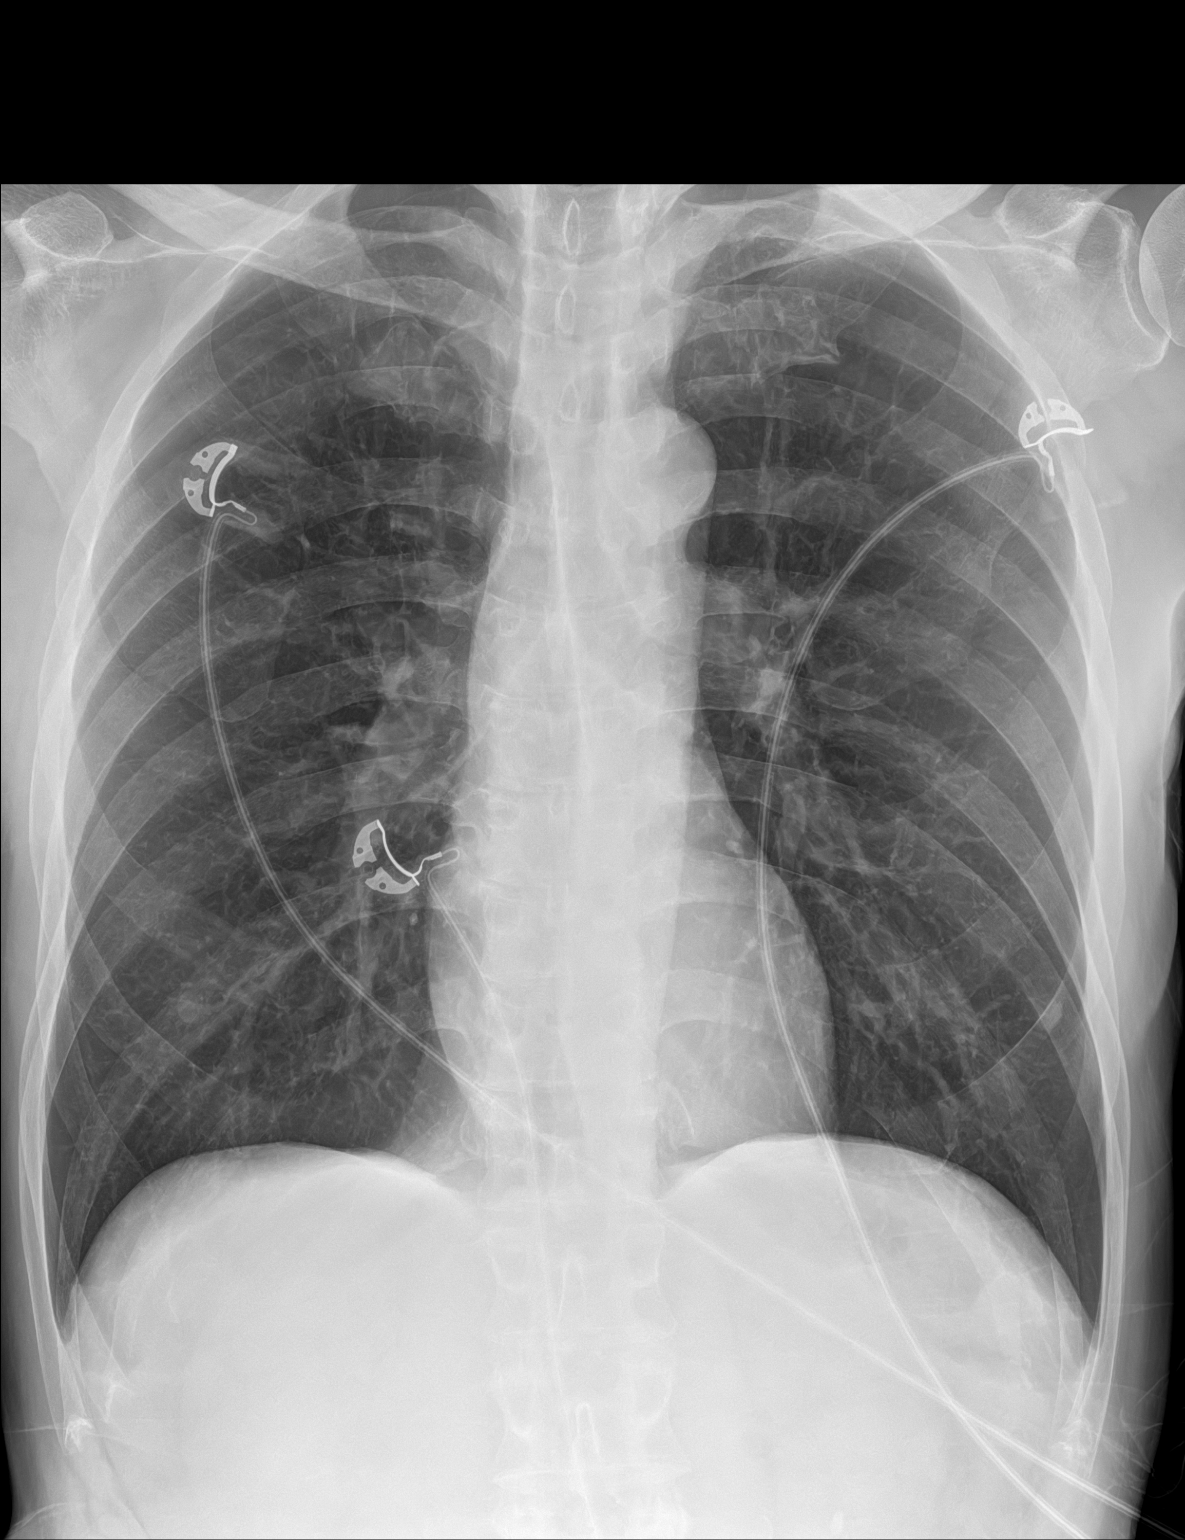

[abdomen erect ap]
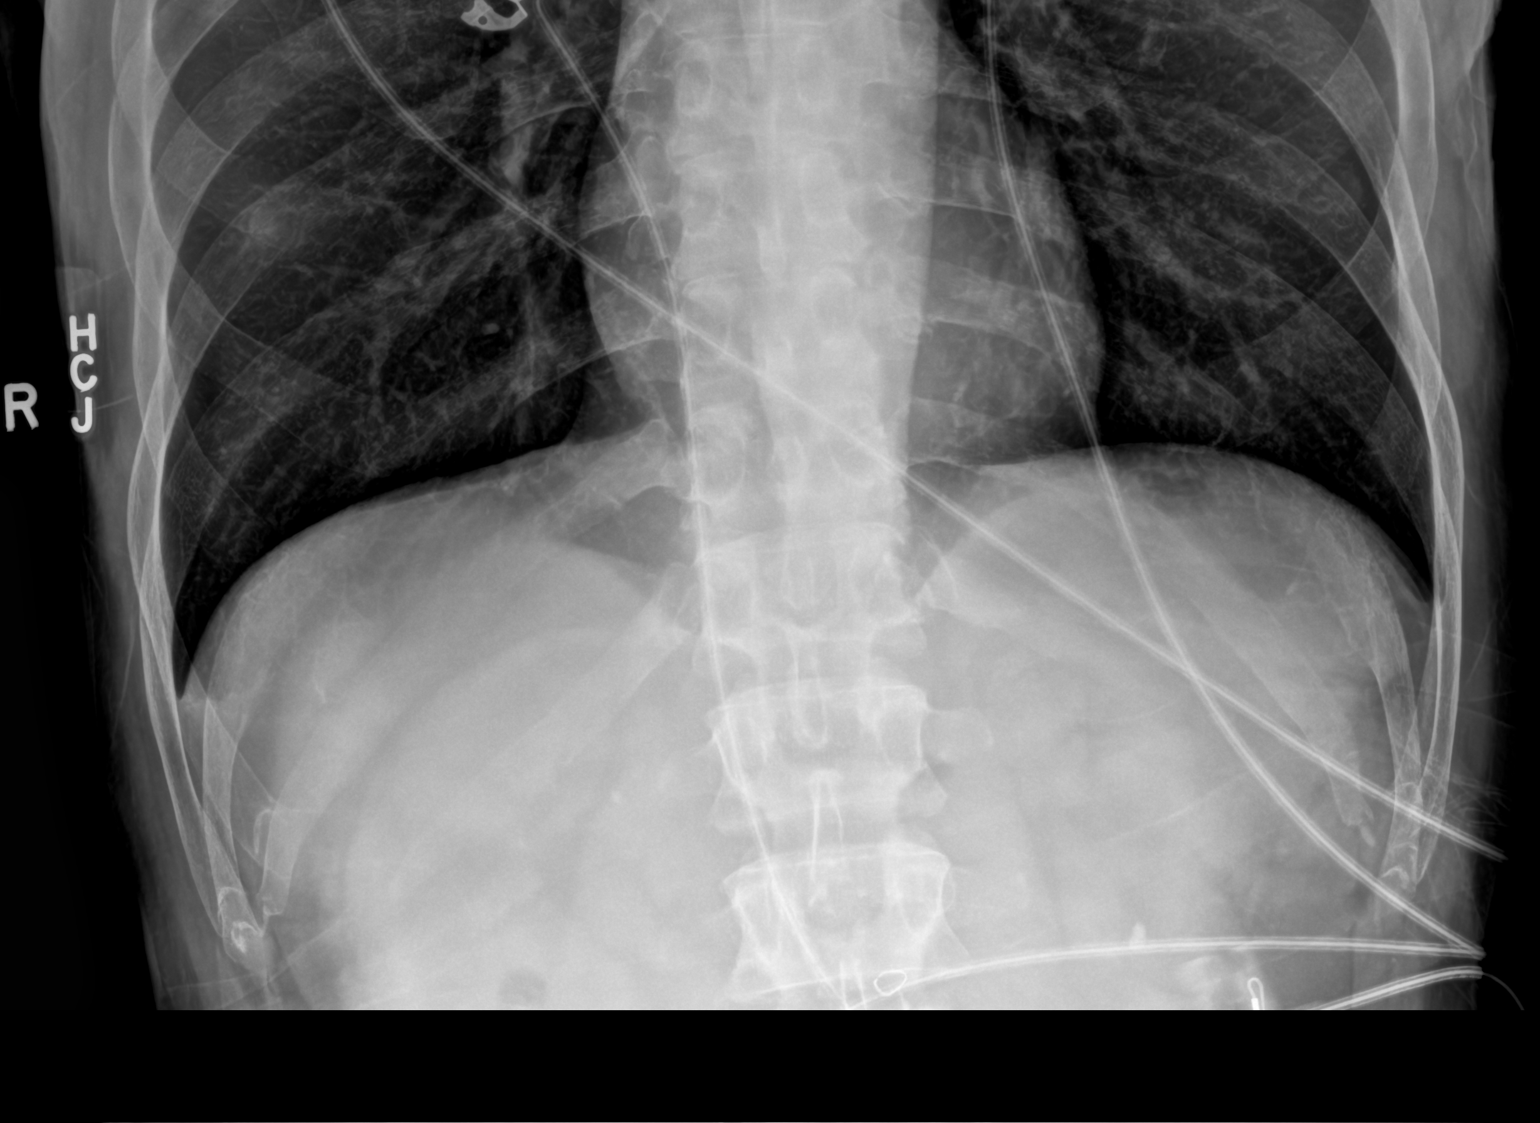

[abdomen supine]
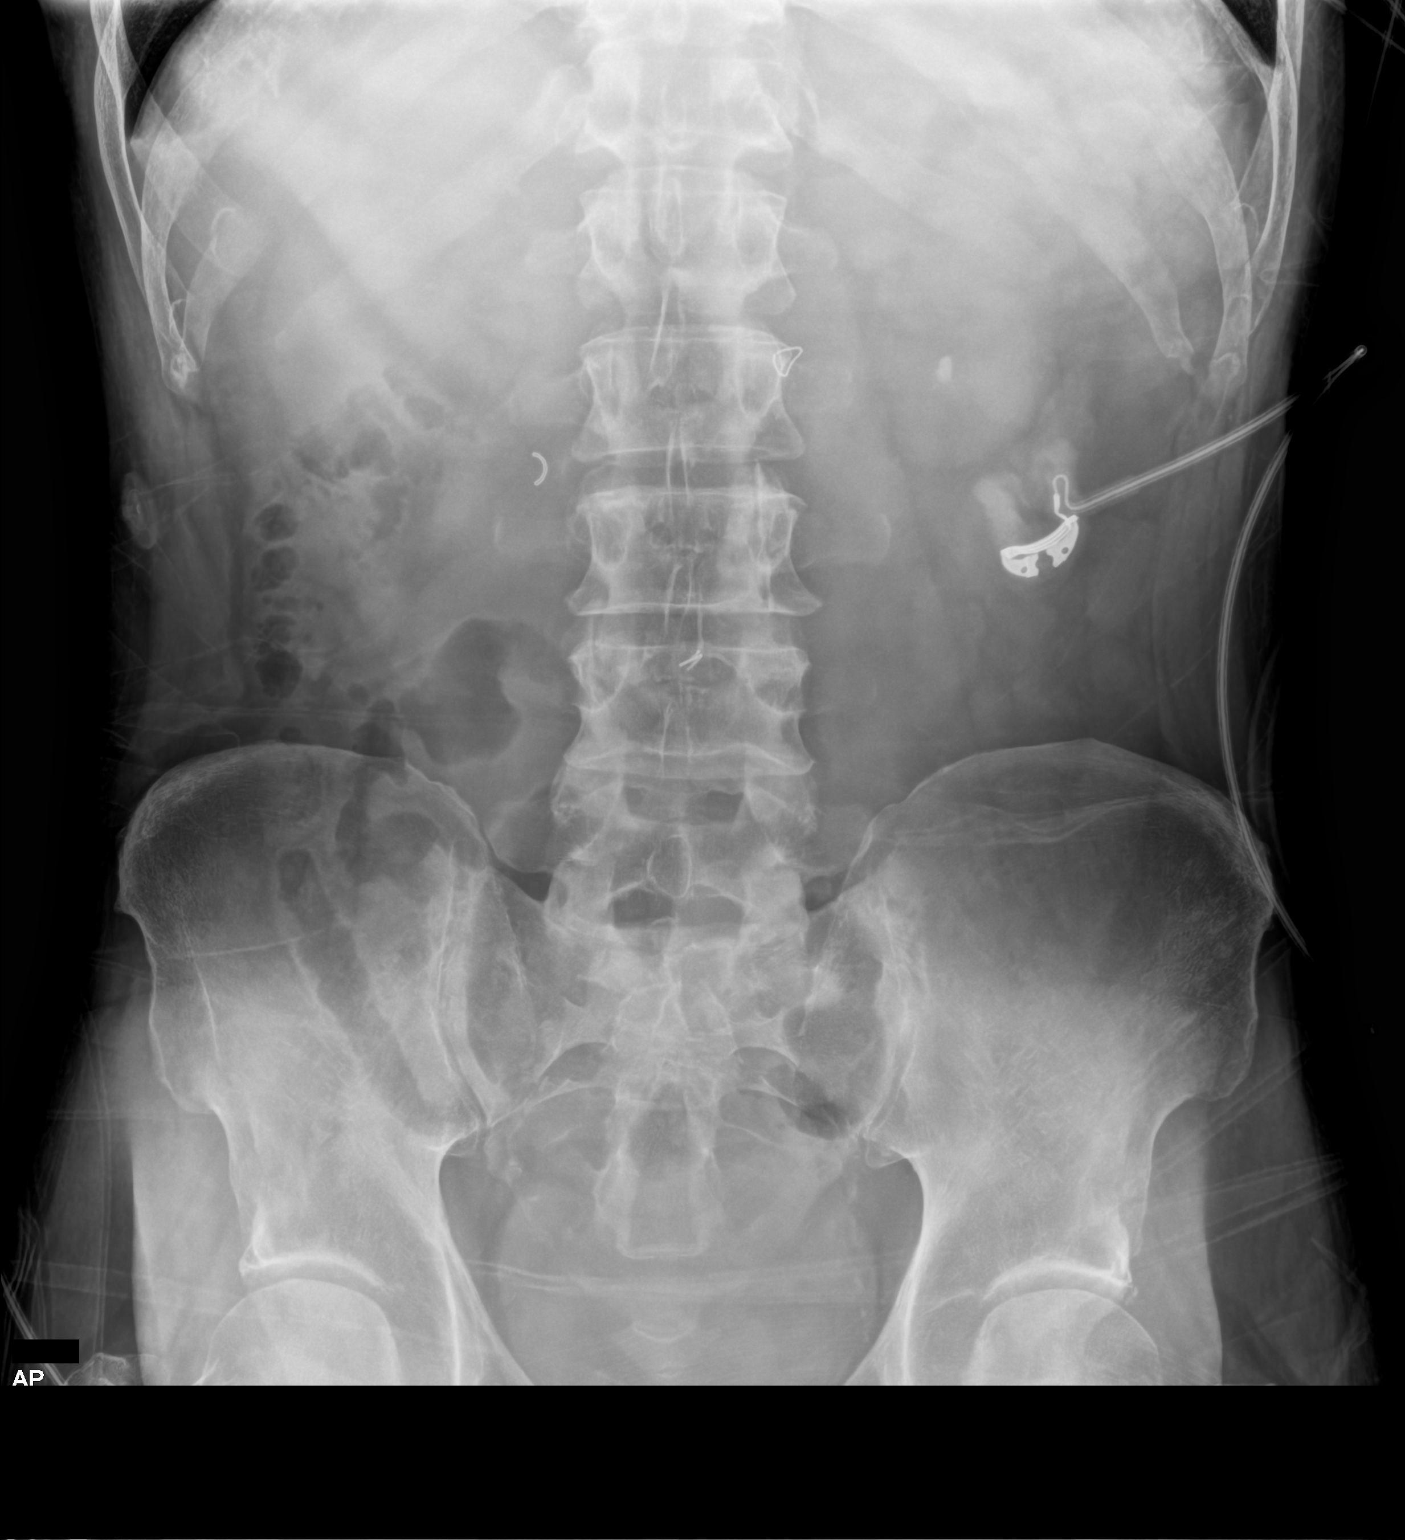

[3 of 3 positions shown; findings below may reference images not displayed]

FINDINGS: There is no evidence of dilated bowel loops or free intraperitoneal
air. Probable left renal calculus is noted. Heart size and
mediastinal contours are within normal limits. Both lungs are clear.
IMPRESSION: No evidence of bowel obstruction or ileus. Probable left renal
calculus. No acute cardiopulmonary disease.

## 2020-08-01 MED ORDER — HEPARIN SODIUM (PORCINE) 5000 UNIT/ML IJ SOLN
5000.0000 [IU] | Freq: Three times a day (TID) | INTRAMUSCULAR | Status: DC
  Administered 2020-08-01 – 2020-08-03 (×5): 5000 [IU] via SUBCUTANEOUS
  Filled 2020-08-01 (×5): qty 1

## 2020-08-01 MED ORDER — INSULIN ASPART 100 UNIT/ML IJ SOLN
0.0000 [IU] | INTRAMUSCULAR | Status: DC
  Administered 2020-08-02: 2 [IU] via SUBCUTANEOUS
  Administered 2020-08-02: 1 [IU] via SUBCUTANEOUS

## 2020-08-01 MED ORDER — METOPROLOL TARTRATE 25 MG PO TABS
12.5000 mg | ORAL_TABLET | Freq: Two times a day (BID) | ORAL | Status: DC
  Administered 2020-08-01 – 2020-08-03 (×3): 12.5 mg via ORAL
  Filled 2020-08-01 (×4): qty 1

## 2020-08-01 MED ORDER — ONDANSETRON HCL 4 MG PO TABS: 4.0000 mg | ORAL_TABLET | Freq: Four times a day (QID) | ORAL | Status: DC | PRN

## 2020-08-01 MED ORDER — ONDANSETRON HCL 4 MG/2ML IJ SOLN: 4.0000 mg | Freq: Four times a day (QID) | INTRAMUSCULAR | Status: DC | PRN

## 2020-08-01 MED ORDER — ONDANSETRON HCL 4 MG/2ML IJ SOLN
4.0000 mg | Freq: Once | INTRAMUSCULAR | Status: AC
  Administered 2020-08-01: 14:00:00 4 mg via INTRAVENOUS
  Filled 2020-08-01: qty 2

## 2020-08-01 MED ORDER — ACETAMINOPHEN 650 MG RE SUPP: 650.0000 mg | Freq: Four times a day (QID) | RECTAL | Status: DC | PRN

## 2020-08-01 MED ORDER — POLYETHYLENE GLYCOL 3350 17 G PO PACK
17.0000 g | PACK | Freq: Every day | ORAL | Status: DC
  Administered 2020-08-01 – 2020-08-02 (×2): 17 g via ORAL
  Filled 2020-08-01 (×2): qty 1

## 2020-08-01 MED ORDER — SODIUM CHLORIDE 0.9 % IV BOLUS
1000.0000 mL | Freq: Once | INTRAVENOUS | Status: AC
  Administered 2020-08-01: 14:00:00 1000 mL via INTRAVENOUS

## 2020-08-01 MED ORDER — SODIUM CHLORIDE 0.9 % IV BOLUS
1000.0000 mL | Freq: Once | INTRAVENOUS | Status: AC
  Administered 2020-08-01: 1000 mL via INTRAVENOUS

## 2020-08-01 MED ORDER — SODIUM CHLORIDE 0.9 % IV SOLN: INTRAVENOUS | Status: AC

## 2020-08-01 MED ORDER — SODIUM CHLORIDE 0.9 % IV BOLUS: 1000.0000 mL | Freq: Once | INTRAVENOUS | Status: DC

## 2020-08-01 MED ORDER — PNEUMOCOCCAL VAC POLYVALENT 25 MCG/0.5ML IJ INJ
0.5000 mL | INJECTION | INTRAMUSCULAR | Status: AC
  Administered 2020-08-03: 0.5 mL via INTRAMUSCULAR
  Filled 2020-08-01: qty 0.5

## 2020-08-01 MED ORDER — SODIUM ZIRCONIUM CYCLOSILICATE 5 G PO PACK
10.0000 g | PACK | Freq: Once | ORAL | Status: AC
  Administered 2020-08-01: 10 g via ORAL
  Filled 2020-08-01: qty 2

## 2020-08-01 MED ORDER — ACETAMINOPHEN 325 MG PO TABS: 650.0000 mg | ORAL_TABLET | Freq: Four times a day (QID) | ORAL | Status: DC | PRN

## 2020-08-01 NOTE — ED Notes (Signed)
Pt unable to urinate at this time.  

## 2020-08-01 NOTE — H&P (Signed)
History and Physical    [name] BSJ:[id number] DOB: 1955-02-23 DOA: 08/01/2020  PCP: [facility] [place]   Patient coming from: Home  I have personally briefly reviewed patient's old medical records in [place]  Chief Complaint: Abdominal discomfort  HPI: Alec Bruce is a 65 y.o. male with medical history significant for diabetes mellitus, stroke, hypertension, cancer s/p bowel resection. Patient presented to the ED with complaints of generalized abdominal discomfort, constipation of 4 days duration.  He denies actual abdominal pain but reports discomfort over the past few days, reports his last good meal was 3 days ago, since then he has thrown up everything he tried to eat or drink.  He reports he would drop about 15 minutes after eating.  He felt he was constipated as he has had similar symptoms in the improved with MiraLAX, which she tried without improvement.  ED Course: Tachycardic to 129.  Temperature 97.9.  Pressure systolic 1 76-5 46T.  WBC 7.1.  Lipase within normal limits 40.  Creatinine elevated 3.39.  Potassium 5.5.  Abdominal CT shows cholelithiasis and gallbladder sludge.  Right upper quadrant ultrasound-gallbladder sludge or stone. EDP talked with Dr. Constance Haw general surgeon, patient to be seen in the morning. Lokelma 10 mg x 1 given. 2 L bolus given, Hospitalist to admit.  Review of Systems: As per HPI all other systems reviewed and negative.  Past Medical History:  Diagnosis Date   Cancer [facility])    colon cancer 90s   Diabetes mellitus without complication ([place])    Hypertension    Stroke ([place])    TIA    Past Surgical History:  Procedure Laterality Date   APPENDECTOMY       reports that he has quit smoking. He has never used smokeless tobacco. He reports current alcohol use. He reports previous drug use.  Family history of hypertension  Prior to Admission medications   Medication Sig Start Date End Date Taking? Authorizing  Provider  acetaminophen (TYLENOL) 325 MG tablet Take 2 tablets (650 mg total) by mouth every 6 (six) hours as needed for mild pain (or Fever >/= 101). 04/01/19   Roxan Hockey, MD  aspirin 325 MG tablet Take 1 tablet (325 mg total) by mouth daily with breakfast. For stroke prevention 04/02/19   Roxan Hockey, MD  atorvastatin (LIPITOR) 40 MG tablet Take 1 tablet (40 mg total) by mouth every evening. For stroke prevention 04/01/19   Roxan Hockey, MD  Cholecalciferol ([employer] VIT D 5000 HIGH-POTENCY PO) Take 5,000 mcg by mouth daily. 08/30/19   [provider]  DULoxetine (CYMBALTA) 60 MG capsule Take 60 mg by mouth at bedtime.    [provider]  ferrous sulfate 325 (65 FE) MG tablet Take 325 mg by mouth daily with breakfast.    [provider]  glipiZIDE (GLUCOTROL) 10 MG tablet Take 1 tablet (10 mg total) by mouth 2 (two) times daily before a meal. 04/01/19   Jancy Sprankle, Courage, MD  metoprolol tartrate (LOPRESSOR) 25 MG tablet Take 0.5 tablets (12.5 mg total) by mouth 2 (two) times daily. 04/01/19   Roxan Hockey, MD  polyethylene glycol (MIRALAX / GLYCOLAX) 17 g packet Take 17 g by mouth daily as needed for mild constipation. 04/01/19   Roxan Hockey, MD  sitaGLIPtin (JANUVIA) 100 MG tablet Take 1 tablet (100 mg total) by mouth daily. 04/01/19   Roxan Hockey, MD    Physical Exam: Vitals:   08/01/20 1328 08/01/20 1545 08/01/20 1600 08/01/20 1630  BP:  139/84 (!) 142/88  Pulse:      Resp:      Temp:      TempSrc:      SpO2:  100% 100% 100%  Weight: 55.3 kg     Height: 6' (1.829 m)       Constitutional: thin,  calm, comfortable Vitals:   08/01/20 1328 08/01/20 1545 08/01/20 1600 08/01/20 1630  BP:   139/84 (!) 142/88  Pulse:      Resp:      Temp:      TempSrc:      SpO2:  100% 100% 100%  Weight: 55.3 kg     Height: 6' (1.829 m)      Eyes: PERRL, lids and conjunctivae normal ENMT: Mucous membranes are dry  Neck: normal, supple, no masses, no  thyromegaly Respiratory: clear to auscultation bilaterally, no wheezing, no crackles. Normal respiratory effort. No accessory muscle use.  Cardiovascular: Regular rate and rhythm, no murmurs / rubs / gallops. No extremity edema. 2+ pedal pulses. No carotid bruits.  Abdomen: no tenderness, no masses palpated. No hepatosplenomegaly.   Musculoskeletal: no clubbing / cyanosis. No joint deformity upper and lower extremities. Good ROM, no contractures. Normal muscle tone.  Skin: no rashes, lesions, ulcers. No induration Neurologic: No apparent cranial formality, moving extremities spontaneously Psychiatric: Normal judgment and insight. Alert and oriented x 3. Normal mood.   Labs on Admission: I have personally reviewed following labs and imaging studies  CBC: Recent Labs  Lab 08/01/20 1351  WBC 7.1  NEUTROABS 5.4  HGB 13.3  HCT 40.8  MCV 92.7  PLT 858   Basic Metabolic Panel: Recent Labs  Lab 08/01/20 1351  NA 135  K 5.5*  CL 105  CO2 15*  GLUCOSE 180*  BUN 94*  CREATININE 3.39*  CALCIUM 9.8   Liver Function Tests: Recent Labs  Lab 08/01/20 1351  AST 29  ALT 33  ALKPHOS 74  BILITOT 1.7*  PROT 7.9  ALBUMIN 4.6   Recent Labs  Lab 08/01/20 1351  LIPASE 40    Radiological Exams on Admission: CT Abdomen Pelvis Wo Contrast  Result Date: 08/01/2020 CLINICAL DATA:  Abdominal pain and constipation. EXAM: CT ABDOMEN AND PELVIS WITHOUT CONTRAST TECHNIQUE: Multidetector CT imaging of the abdomen and pelvis was performed following the standard protocol without IV contrast. COMPARISON:  None. FINDINGS: Lower chest: No acute abnormality. Hepatobiliary: No focal liver abnormality is seen. Gallbladder sludge and a subcentimeter gallstone are seen within the gallbladder lumen, without evidence of gallbladder wall thickening or biliary dilatation. A 3 mm calcification is seen adjacent to the superomedial aspect of the gallbladder (axial CT image 18, CT series 2). Pancreas: Unremarkable.  No pancreatic ductal dilatation or surrounding inflammatory changes. Spleen: Normal in size without focal abnormality. Adrenals/Urinary Tract: Adrenal glands are unremarkable. Kidneys are normal in size. 2 mm nonobstructing renal stones are seen within the right kidney. A 6 mm nonobstructing renal stone is seen within the left kidney. A 2.2 cm x 1.9 cm cyst is noted within the lateral aspect of the mid to lower left kidney. Bladder is unremarkable. Stomach/Bowel: Stomach is within normal limits. The appendix is not identified. A mild amount of stool is seen within the sigmoid colon. No evidence of bowel wall thickening, distention, or inflammatory changes. Vascular/Lymphatic: Aortic atherosclerosis. No enlarged abdominal or pelvic lymph nodes. Reproductive: Prostate is unremarkable. Other: No abdominal wall hernia or abnormality. No abdominopelvic ascites. Musculoskeletal: No acute or significant osseous findings. IMPRESSION: 1. Cholelithiasis and gallbladder  sludge with an additional small calcification adjacent to the gallbladder. A small gallstone within the common bile duct cannot be excluded. Correlation with right upper quadrant ultrasound is recommended. 2. Bilateral subcentimeter nonobstructing renal calculi. 3. Minimal stool burden within the distal sigmoid colon. Electronically Signed   By: Virgina Norfolk M.D.   On: 08/01/2020 15:46   DG ABD ACUTE 2+V W 1V CHEST  Result Date: 08/01/2020 CLINICAL DATA:  Abdominal pain, constipation. EXAM: DG ABDOMEN ACUTE WITH 1 VIEW CHEST COMPARISON:  March 31, 2019. FINDINGS: There is no evidence of dilated bowel loops or free intraperitoneal air. Probable left renal calculus is noted. Heart size and mediastinal contours are within normal limits. Both lungs are clear. IMPRESSION: No evidence of bowel obstruction or ileus. Probable left renal calculus. No acute cardiopulmonary disease. Electronically Signed   By: Marijo Conception M.D.   On: 08/01/2020 14:58   US  Abdomen Limited RUQ (LIVER/GB)  Result Date: 08/01/2020 CLINICAL DATA:  65 year old male with right upper quadrant abdominal pain. EXAM: ULTRASOUND ABDOMEN LIMITED RIGHT UPPER QUADRANT COMPARISON:  CT abdomen pelvis dated 08/01/2020. FINDINGS: Gallbladder: There is a stone or sludge ball within the gallbladder. No gallbladder wall thickening or pericholecystic fluid. Negative sonographic Murphy's sign. Common bile duct: Diameter: 3 mm Liver: The liver demonstrates a normal echogenicity. Apparent 1 cm echogenic lesion in the right lobe of the liver (67/85) may represent confluence of periportal fat. A liver lesion is less likely but not entirely excluded. Further evaluation with MRI or follow-up with ultrasound in 3 months recommended. Portal vein is patent on color Doppler imaging with normal direction of blood flow towards the liver. Other: None. IMPRESSION: 1. Gallbladder sludge or stone. No sonographic findings of acute cholecystitis. 2. Confluence periportal fat versus less likely an echogenic liver lesion. Further evaluation with MRI or follow-up with ultrasound in 3 months recommended. Electronically Signed   By: Anner Crete M.D.   On: 08/01/2020 17:17    EKG: Independently reviewed.  Sinus tachycardia rate 118.  QTc 403.  No significant change from prior.  Assessment/Plan Principal Problem:   AKI (acute kidney injury) ([place]) Active Problems:   Uncontrolled type 2 diabetes mellitus with hyperglycemia, without long-term current use of insulin ([facility])   HTN (hypertension)   Colon cancer ([facility])   Acute kidney injury-creatinine 3.39, baseline likely somewhere between 1.2-1.6.  Likely prerenal from poor oral intake, vomiting.  With tachycardia to 120s likely from dehydration also.  Normal Kidneys on CT. -2 L bolus given, continue N/s 100cc/hr x 1 day  Cholelithiasis/gallbladder sludge-incidental finding versus etiology of patient's abdominal discomfort nausea vomiting.  Seen on ultrasound and  abdominal CT Wo contrast.  Liver enzymes lipase normal.  Benign abdominal exam. - EDP to Dr. Constance Haw, patient to be seen in the morning -Hydrate. -Bowel rest with clear liquid diet, n.p.o. midnight pending surgery evaluation  Diabetes mellitus-random glucose 180. - SSI- S Q6h -Hold glipizide and Januvia  Hypertension-stable. -Resume metoprolol  Protein energy malnutrition-BMI 16.5, over the past year and a half, patient has lost about 20 pounds of weight.  History of colon cancer, for which she had bowel resection, and CT imaging unremarkable. -Check TSH  Dyslipidemia - Hold statins  DVT prophylaxis: Heparin Code Status: Full code Family Communication:  None at bedside Disposition Plan: ~ 1 - 2 days Consults called: Gen surg Admission status: Obs, tele   Bethena Roys MD [facility]  08/01/2020, 8:07 PM

## 2020-08-01 NOTE — ED Provider Notes (Signed)
[facility] EMERGENCY DEPARTMENT Provider Note   CSN: [id number] Arrival date & time: 08/01/20  1313     History Chief Complaint  Patient presents with   Abdominal Pain    Alec Bruce is a 65 y.o. male with Pmhx HTN, Diabetes, Colon cancer s/p bowel resection, and Stroke who presents to the ED today with complaint of gradual onset, constant, diffuse, abdominal "discomfort" for the past 3-4 days. Pt also reports he has not had a bowel movement in the same amount of time. He reports anytime he eats he will get nauseated and feel like he has to throw up. He reports similar symptoms in February of last year and was provided with a medication that seemed to help (Miralax). Pt states he had left overs that he tried yesterday and today without relief. No other complaints at this time. Pt is "unsure" if he is still passing gas.   The history is provided by the patient and medical records.      Past Medical History:  Diagnosis Date   Cancer [facility])    colon cancer 90s   Diabetes mellitus without complication ([place])    Hypertension    Stroke [facility])    TIA    Patient Active Problem List   Diagnosis Date Noted   AKI (acute kidney injury) ([place]) 03/31/2019   Uncontrolled type 2 diabetes mellitus with hyperglycemia, without long-term current use of insulin ([place]) 03/21/2019   TIA (transient ischemic attack) 03/20/2019    Past Surgical History:  Procedure Laterality Date   APPENDECTOMY         No family history on file.  Social History   Tobacco Use   Smoking status: Former    Pack years: 0.00   Smokeless tobacco: Never  Vaping Use   Vaping Use: Never used  Substance Use Topics   Alcohol use: Yes    Comment: occ   Drug use: Not Currently    Home Medications Prior to Admission medications   Medication Sig Start Date End Date Taking? Authorizing Provider  acetaminophen (TYLENOL) 325 MG tablet Take 2 tablets (650 mg total) by mouth every 6 (six) hours as needed for mild  pain (or Fever >/= 101). 04/01/19   Roxan Hockey, MD  aspirin 325 MG tablet Take 1 tablet (325 mg total) by mouth daily with breakfast. For stroke prevention 04/02/19   Roxan Hockey, MD  atorvastatin (LIPITOR) 40 MG tablet Take 1 tablet (40 mg total) by mouth every evening. For stroke prevention 04/01/19   Roxan Hockey, MD  Cholecalciferol ([employer]S VIT D 5000 HIGH-POTENCY PO) Take 5,000 mcg by mouth daily. 08/30/19   [provider]  DULoxetine (CYMBALTA) 60 MG capsule Take 60 mg by mouth at bedtime.    [provider]  ferrous sulfate 325 (65 FE) MG tablet Take 325 mg by mouth daily with breakfast.    [provider]  glipiZIDE (GLUCOTROL) 10 MG tablet Take 1 tablet (10 mg total) by mouth 2 (two) times daily before a meal. 04/01/19   Emokpae, Courage, MD  metoprolol tartrate (LOPRESSOR) 25 MG tablet Take 0.5 tablets (12.5 mg total) by mouth 2 (two) times daily. 04/01/19   Roxan Hockey, MD  polyethylene glycol (MIRALAX / GLYCOLAX) 17 g packet Take 17 g by mouth daily as needed for mild constipation. 04/01/19   Roxan Hockey, MD  sitaGLIPtin (JANUVIA) 100 MG tablet Take 1 tablet (100 mg total) by mouth daily. 04/01/19   Roxan Hockey, MD    Allergies  Patient has no known allergies.  Review of Systems   Review of Systems  Constitutional:  Negative for chills and fever.  Gastrointestinal:  Positive for abdominal pain, constipation and nausea. Negative for vomiting.  All other systems reviewed and are negative.  Physical Exam Updated Vital Signs BP (!) 142/88   Pulse (!) 129   Temp 97.9 F (36.6 C) (Oral)   Resp 20   Ht 6' (1.829 m)   Wt 55.3 kg   SpO2 100%   BMI 16.55 kg/m   Physical Exam Vitals and nursing note reviewed.  Constitutional:      Appearance: He is not ill-appearing or diaphoretic.     Comments: Thin frail male  HENT:     Head: Normocephalic and atraumatic.  Eyes:     Conjunctiva/sclera: Conjunctivae normal.  Cardiovascular:      Rate and Rhythm: Normal rate and regular rhythm.     Heart sounds: Normal heart sounds.  Pulmonary:     Effort: Pulmonary effort is normal.     Breath sounds: Normal breath sounds. No wheezing, rhonchi or rales.  Abdominal:     General: Abdomen is flat. Bowel sounds are decreased.     Palpations: Abdomen is soft.     Tenderness: There is no abdominal tenderness. There is no guarding or rebound.     Comments: Midline surgical scar to abdomen  Musculoskeletal:     Cervical back: Neck supple.  Skin:    General: Skin is warm and dry.  Neurological:     Mental Status: He is alert.    ED Results / Procedures / Treatments   Labs (all labs ordered are listed, but only abnormal results are displayed) Labs Reviewed  COMPREHENSIVE METABOLIC PANEL - Abnormal; Notable for the following components:      Result Value   Potassium 5.5 (*)    CO2 15 (*)    Glucose, Bld 180 (*)    BUN 94 (*)    Creatinine, Ser 3.39 (*)    Total Bilirubin 1.7 (*)    GFR, Estimated 19 (*)    All other components within normal limits  BLOOD GAS, VENOUS - Abnormal; Notable for the following components:   pH, Ven 7.237 (*)    pCO2, Ven 36.7 (*)    pO2, Ven <31.0 (*)    Bicarbonate 14.6 (*)    Acid-base deficit 10.9 (*)    All other components within normal limits  SARS CORONAVIRUS 2 (TAT 6-24 HRS)  LIPASE, BLOOD  CBC WITH DIFFERENTIAL/PLATELET  URINALYSIS, ROUTINE W REFLEX MICROSCOPIC    EKG None  Radiology CT Abdomen Pelvis Wo Contrast  Result Date: 08/01/2020 CLINICAL DATA:  Abdominal pain and constipation. EXAM: CT ABDOMEN AND PELVIS WITHOUT CONTRAST TECHNIQUE: Multidetector CT imaging of the abdomen and pelvis was performed following the standard protocol without IV contrast. COMPARISON:  None. FINDINGS: Lower chest: No acute abnormality. Hepatobiliary: No focal liver abnormality is seen. Gallbladder sludge and a subcentimeter gallstone are seen within the gallbladder lumen, without evidence of  gallbladder wall thickening or biliary dilatation. A 3 mm calcification is seen adjacent to the superomedial aspect of the gallbladder (axial CT image 18, CT series 2). Pancreas: Unremarkable. No pancreatic ductal dilatation or surrounding inflammatory changes. Spleen: Normal in size without focal abnormality. Adrenals/Urinary Tract: Adrenal glands are unremarkable. Kidneys are normal in size. 2 mm nonobstructing renal stones are seen within the right kidney. A 6 mm nonobstructing renal stone is seen within the left kidney. A 2.2 cm x  1.9 cm cyst is noted within the lateral aspect of the mid to lower left kidney. Bladder is unremarkable. Stomach/Bowel: Stomach is within normal limits. The appendix is not identified. A mild amount of stool is seen within the sigmoid colon. No evidence of bowel wall thickening, distention, or inflammatory changes. Vascular/Lymphatic: Aortic atherosclerosis. No enlarged abdominal or pelvic lymph nodes. Reproductive: Prostate is unremarkable. Other: No abdominal wall hernia or abnormality. No abdominopelvic ascites. Musculoskeletal: No acute or significant osseous findings. IMPRESSION: 1. Cholelithiasis and gallbladder sludge with an additional small calcification adjacent to the gallbladder. A small gallstone within the common bile duct cannot be excluded. Correlation with right upper quadrant ultrasound is recommended. 2. Bilateral subcentimeter nonobstructing renal calculi. 3. Minimal stool burden within the distal sigmoid colon. Electronically Signed   By: Virgina Norfolk M.D.   On: 08/01/2020 15:46   DG ABD ACUTE 2+V W 1V CHEST  Result Date: 08/01/2020 CLINICAL DATA:  Abdominal pain, constipation. EXAM: DG ABDOMEN ACUTE WITH 1 VIEW CHEST COMPARISON:  March 31, 2019. FINDINGS: There is no evidence of dilated bowel loops or free intraperitoneal air. Probable left renal calculus is noted. Heart size and mediastinal contours are within normal limits. Both lungs are clear.  IMPRESSION: No evidence of bowel obstruction or ileus. Probable left renal calculus. No acute cardiopulmonary disease. Electronically Signed   By: Marijo Conception M.D.   On: 08/01/2020 14:58   US Abdomen Limited RUQ (LIVER/GB)  Result Date: 08/01/2020 CLINICAL DATA:  65 year old male with right upper quadrant abdominal pain. EXAM: ULTRASOUND ABDOMEN LIMITED RIGHT UPPER QUADRANT COMPARISON:  CT abdomen pelvis dated 08/01/2020. FINDINGS: Gallbladder: There is a stone or sludge ball within the gallbladder. No gallbladder wall thickening or pericholecystic fluid. Negative sonographic Murphy's sign. Common bile duct: Diameter: 3 mm Liver: The liver demonstrates a normal echogenicity. Apparent 1 cm echogenic lesion in the right lobe of the liver (67/85) may represent confluence of periportal fat. A liver lesion is less likely but not entirely excluded. Further evaluation with MRI or follow-up with ultrasound in 3 months recommended. Portal vein is patent on color Doppler imaging with normal direction of blood flow towards the liver. Other: None. IMPRESSION: 1. Gallbladder sludge or stone. No sonographic findings of acute cholecystitis. 2. Confluence periportal fat versus less likely an echogenic liver lesion. Further evaluation with MRI or follow-up with ultrasound in 3 months recommended. Electronically Signed   By: Anner Crete M.D.   On: 08/01/2020 17:17    Procedures Procedures   Medications Ordered in ED Medications  ondansetron (ZOFRAN) injection 4 mg (4 mg Intravenous Given 08/01/20 1408)  sodium chloride 0.9 % bolus 1,000 mL (0 mLs Intravenous Stopped 08/01/20 1506)  sodium zirconium cyclosilicate (LOKELMA) packet 10 g (10 g Oral Given 08/01/20 1702)  sodium chloride 0.9 % bolus 1,000 mL (1,000 mLs Intravenous New Bag/Given 08/01/20 1703)    ED Course  I have reviewed the triage vital signs and the nursing notes.  Pertinent labs & imaging results that were available during my care of the  patient were reviewed by me and considered in my medical decision making (see chart for details).  Clinical Course as of 08/01/20 1742  Thu Aug 01, 2020  1459 Creatinine(!): 3.39 [MV]  1500 BUN(!): 94 [MV]  1550 Total Bilirubin(!): 1.7 [MV]    Clinical Course User Index [MV] Eustaquio Maize, PA-C   MDM Rules/Calculators/A&P  66 year old male who presents to the ED today with complaint of constipation for the last 4 days.  Also complains of abdominal discomfort.  History of same, no relief with MiraLAX.  Does have history of colon resection status post colon cancer.  On arrival to the ED today patient is tachycardic in the 120s.  Remainder of vitals are unremarkable.  Low suspicion for sepsis at this time.  We will plan for fluids, antiemetics, labs including x-ray of abdomen to assess for constipation versus concern for early SBO.  Plus or minus CT scan at this time.   CBC without leukocytosis. Hgb stable at 13.3 CMP with creatinine 3.39, BUN 94. Glucose 180, bicarb of 15. No gap. Potassium 5.5. EKG obtained for hyperkalemia; lokelma provided. Given hx of diabetes and low bicarb VBG ordered. Fluids running for AKI. Pt will require admission for AKI  Lab Results  Component Value Date   CREATININE 3.39 (H) 08/01/2020   CREATININE 1.69 (H) 10/16/2019   CREATININE 1.21 04/01/2019   Lipase 40 Acute abdominal series:   IMPRESSION:  No evidence of bowel obstruction or ileus. Probable left renal  calculus. No acute cardiopulmonary disease.   CT obtained due to unequivocal xray: IMPRESSION:  1. Cholelithiasis and gallbladder sludge with an additional small  calcification adjacent to the gallbladder. A small gallstone within  the common bile duct cannot be excluded. Correlation with right  upper quadrant ultrasound is recommended.  2. Bilateral subcentimeter nonobstructing renal calculi.  3. Minimal stool burden within the distal sigmoid colon.   RUQ obtained  due to CT scan findings. Consistent with gallbladder sludge; no evidence of acute cholecystitis at this time. Dr. Constance Haw general surgery has been consulted; agrees with medicine admission and will plan to evaluate pt in the AM.   VBG with pH 7.237. Bicarb 14.6. discussed with attending physician Dr. Roderic Palau; doubts DKA at this time given glucose not > 250. Recommends additional liter IVF as he suspects bicarb low s/2 dehydration.   Discussed case with [facility] Dr. Denton Brick who agrees to evaluate patient for admission.   This note was prepared using Dragon voice recognition software and may include unintentional dictation errors due to the inherent limitations of voice recognition software.   Final Clinical Impression(s) / ED Diagnoses Final diagnoses:  RUQ abdominal pain  Gallbladder sludge  AKI (acute kidney injury) ([place])  Hyperkalemia    Rx / DC Orders ED Discharge Orders     None        Eustaquio Maize, PA-C 08/01/20 1742    Milton Ferguson, MD 08/02/20 1036

## 2020-08-01 NOTE — ED Triage Notes (Signed)
Abdominal pain with constipation x 4 days

## 2020-08-02 DIAGNOSIS — N179 Acute kidney failure, unspecified: Principal | ICD-10-CM

## 2020-08-02 LAB — GLUCOSE, CAPILLARY
Glucose-Capillary: 102 mg/dL — ABNORMAL HIGH (ref 70–99)
Glucose-Capillary: 120 mg/dL — ABNORMAL HIGH (ref 70–99)
Glucose-Capillary: 142 mg/dL — ABNORMAL HIGH (ref 70–99)
Glucose-Capillary: 159 mg/dL — ABNORMAL HIGH (ref 70–99)
Glucose-Capillary: 54 mg/dL — ABNORMAL LOW (ref 70–99)
Glucose-Capillary: 72 mg/dL (ref 70–99)

## 2020-08-02 LAB — BASIC METABOLIC PANEL
Anion gap: 6 (ref 5–15)
BUN: 71 mg/dL — ABNORMAL HIGH (ref 8–23)
CO2: 19 mmol/L — ABNORMAL LOW (ref 22–32)
Calcium: 8.7 mg/dL — ABNORMAL LOW (ref 8.9–10.3)
Chloride: 113 mmol/L — ABNORMAL HIGH (ref 98–111)
Creatinine, Ser: 2.25 mg/dL — ABNORMAL HIGH (ref 0.61–1.24)
GFR, Estimated: 32 mL/min — ABNORMAL LOW (ref >60)
Glucose, Bld: 158 mg/dL — ABNORMAL HIGH (ref 70–99)
Potassium: 4.5 mmol/L (ref 3.5–5.1)
Sodium: 138 mmol/L (ref 135–145)

## 2020-08-02 LAB — CBC
HCT: 31.2 % — ABNORMAL LOW (ref 39.0–52.0)
Hemoglobin: 10 g/dL — ABNORMAL LOW (ref 13.0–17.0)
MCH: 29.8 pg (ref 26.0–34.0)
MCHC: 32.1 g/dL (ref 30.0–36.0)
MCV: 92.9 fL (ref 80.0–100.0)
Platelets: 198 10*3/uL (ref 150–400)
RBC: 3.36 MIL/uL — ABNORMAL LOW (ref 4.22–5.81)
RDW: 12.3 % (ref 11.5–15.5)
WBC: 5.8 10*3/uL (ref 4.0–10.5)
nRBC: 0 % (ref 0.0–0.2)

## 2020-08-02 LAB — HIV ANTIBODY (ROUTINE TESTING W REFLEX): HIV Screen 4th Generation wRfx: NONREACTIVE

## 2020-08-02 LAB — TSH: TSH: 1.488 u[IU]/mL (ref 0.350–4.500)

## 2020-08-02 LAB — SARS CORONAVIRUS 2 (TAT 6-24 HRS): SARS Coronavirus 2: NEGATIVE

## 2020-08-02 MED ORDER — POLYETHYLENE GLYCOL 3350 17 G PO PACK
17.0000 g | PACK | Freq: Two times a day (BID) | ORAL | Status: DC
  Administered 2020-08-03: 17 g via ORAL
  Filled 2020-08-02 (×2): qty 1

## 2020-08-02 MED ORDER — BISACODYL 10 MG RE SUPP
10.0000 mg | Freq: Once | RECTAL | Status: AC
  Administered 2020-08-02: 10 mg via RECTAL
  Filled 2020-08-02: qty 1

## 2020-08-02 MED ORDER — SENNOSIDES-DOCUSATE SODIUM 8.6-50 MG PO TABS
2.0000 | ORAL_TABLET | Freq: Every day | ORAL | Status: DC
  Administered 2020-08-02: 2 via ORAL
  Filled 2020-08-02: qty 2

## 2020-08-02 NOTE — Progress Notes (Signed)
Digital rectal exam performed with no stool noted in rectal vault. Dulcolax supp admin'd per order, pt tolerated well. Pt has had five BM's total today, four with small formed stool and liquid and one just liquid. Pt has tolerated regular diet well today with no n/v. Abd soft and non-distended. Pt states that when he gets OOB to BR that he no longer feels lightheaded or dizzy. States he feels so much better that yesterday.

## 2020-08-02 NOTE — Progress Notes (Signed)
Patient Demographics:    Alec Bruce, is a 65 y.o. male, DOB - 06/11/1955, EEF:[id number]  Admit date - 08/01/2020   Admitting Physician Daylin Gruszka Denton Brick, MD  Outpatient Primary MD for the patient is [facility] [place]  LOS - 0   Chief Complaint  Patient presents with   Abdominal Pain        Subjective:    Alec Bruce today has no fevers, no further emesis,  No chest pain,   Voiding okay, had hard BM  Assessment  & Plan :    Principal Problem:   AKI (acute kidney injury) ([place]) Active Problems:   Uncontrolled type 2 diabetes mellitus with hyperglycemia, without long-term current use of insulin ([place])   HTN (hypertension)   Colon cancer [facility])  Brief Summary:- 65 y.o. male with medical history significant for diabetes mellitus, stroke, hypertension, cancer s/p bowel resection admitted on 08/02/2019 to with AKI in the setting of constipation, cholelithiasis with abdominal pain and nausea and vomiting  A/p 1)AKI----acute kidney injury on CKD 3A -Baseline creatinine usually 1.2-1.6 -Creatinine is down to 2.25 from 3.39 on admission with hydration -Continue IV fluids  -renally adjust medications, avoid nephrotoxic agents / dehydration  / hypotension  2) constipation with nausea vomiting--- patient had hard BM, give laxatives as ordered  3) cholelithiasis/gallbladder/----CT abdomen and ultrasound of the right upper quadrant noted  --no evidence of acute cholecystitis at this time outpatient follow-up with general surgery for possible elective cholecystectomy advised  4)DM2-previously uncontrolled with hyperglycemia, no recent A1c, -Glipizide and Januvia on hold Use Novolog/Humalog Sliding scale insulin with Accu-Cheks/Fingersticks as ordered   5)HTN-stable, continue metoprolol  Disposition/Need for in-Hospital Stay- patient unable to be discharged at this time due to ---  AKI in the setting of nausea vomiting and cholelithiasis requiring IV fluid--- pending better tolerance of oral intake and improvement in renal function*  Status is: Inpatient  Remains inpatient appropriate because: Please see disposition above  Disposition: The patient is from: Home              Anticipated d/c is to: Home              Anticipated d/c date is: 1 day              Patient currently is not medically stable to d/c. Barriers: Not Clinically Stable-   Code Status :  -  Code Status: Full Code   Family Communication:    NA (patient is alert, awake and coherent)   Consults  :  na  DVT Prophylaxis  :   - SCDs  heparin injection 5,000 Units Start: 08/01/20 2200    Lab Results  Component Value Date   PLT 198 08/02/2020    Inpatient Medications  Scheduled Meds:  heparin  5,000 Units Subcutaneous Q8H   insulin aspart  0-9 Units Subcutaneous Q4H   metoprolol tartrate  12.5 mg Oral BID   pneumococcal 23 valent vaccine  0.5 mL Intramuscular Tomorrow-1000   polyethylene glycol  17 g Oral BID   senna-docusate  2 tablet Oral QHS   Continuous Infusions:  sodium chloride 100 mL/hr at 08/02/20 1800   PRN Meds:.acetaminophen **OR** acetaminophen, ondansetron **OR** ondansetron (ZOFRAN) IV    Anti-infectives (From admission, onward)  None         Objective:   Vitals:   08/02/20 0248 08/02/20 0445 08/02/20 0946 08/02/20 1357  BP: 100/60 (!) 99/57 105/64 108/64  Pulse: 92 93 86 76  Resp: 19 16  17   Temp: 98.1 F (36.7 C) 98.2 F (36.8 C)  98.1 F (36.7 C)  TempSrc: Oral Oral  Oral  SpO2: 99% 100%  100%  Weight:      Height:        Wt Readings from Last 3 Encounters:  08/01/20 56.3 kg  10/16/19 59 kg  03/31/19 58.8 kg     Intake/Output Summary (Last 24 hours) at 08/02/2020 1843 Last data filed at 08/02/2020 1800 Gross per 24 hour  Intake 864.61 ml  Output 800 ml  Net 64.61 ml   Physical Exam  Gen:- Awake Alert,  in no apparent distress  HEENT:-  [place].AT, No sclera icterus Neck-Supple Neck,No JVD,.  Lungs-  CTAB , fair symmetrical air movement CV- S1, S2 normal, regular  Abd-  +ve B.Sounds, Abd Soft, No tenderness,    Extremity/Skin:- No  edema, pedal pulses present  Psych-affect is appropriate, oriented x3 Neuro-no new focal deficits, no tremors   Data Review:   Micro Results Recent Results (from the past 240 hour(s))  SARS CORONAVIRUS 2 (TAT 6-24 HRS) Nasopharyngeal Nasopharyngeal Swab     Status: None   Collection Time: 08/01/20  3:23 PM   Specimen: Nasopharyngeal Swab  Result Value Ref Range Status   SARS Coronavirus 2 NEGATIVE NEGATIVE Final    Comment: (NOTE) SARS-CoV-2 target nucleic acids are NOT DETECTED.  The SARS-CoV-2 RNA is generally detectable in upper and lower respiratory specimens during the acute phase of infection. Negative results do not preclude SARS-CoV-2 infection, do not rule out co-infections with other pathogens, and should not be used as the sole basis for treatment or other patient management decisions. Negative results must be combined with clinical observations, patient history, and epidemiological information. The expected result is Negative.  Fact Sheet for Patients: SugarRoll.be  Fact Sheet for Healthcare Providers: https://www.woods-mathews.com/  This test is not yet approved or cleared by the [place] FDA and  has been authorized for detection and/or diagnosis of SARS-CoV-2 by FDA under an Emergency Use Authorization (EUA). This EUA will remain  in effect (meaning this test can be used) for the duration of the COVID-19 declaration under Se ction 564(b)(1) of the Act, 21 U.S.C. section 360bbb-3(b)(1), unless the authorization is terminated or revoked sooner.  Performed at [facility], [place] [address], [place], [place] [address]     Radiology Reports CT Abdomen Pelvis Wo Contrast  Result Date: 08/01/2020 CLINICAL DATA:   Abdominal pain and constipation. EXAM: CT ABDOMEN AND PELVIS WITHOUT CONTRAST TECHNIQUE: Multidetector CT imaging of the abdomen and pelvis was performed following the standard protocol without IV contrast. COMPARISON:  None. FINDINGS: Lower chest: No acute abnormality. Hepatobiliary: No focal liver abnormality is seen. Gallbladder sludge and a subcentimeter gallstone are seen within the gallbladder lumen, without evidence of gallbladder wall thickening or biliary dilatation. A 3 mm calcification is seen adjacent to the superomedial aspect of the gallbladder (axial CT image 18, CT series 2). Pancreas: Unremarkable. No pancreatic ductal dilatation or surrounding inflammatory changes. Spleen: Normal in size without focal abnormality. Adrenals/Urinary Tract: Adrenal glands are unremarkable. Kidneys are normal in size. 2 mm nonobstructing renal stones are seen within the right kidney. A 6 mm nonobstructing renal stone is seen within the left kidney. A 2.2  cm x 1.9 cm cyst is noted within the lateral aspect of the mid to lower left kidney. Bladder is unremarkable. Stomach/Bowel: Stomach is within normal limits. The appendix is not identified. A mild amount of stool is seen within the sigmoid colon. No evidence of bowel wall thickening, distention, or inflammatory changes. Vascular/Lymphatic: Aortic atherosclerosis. No enlarged abdominal or pelvic lymph nodes. Reproductive: Prostate is unremarkable. Other: No abdominal wall hernia or abnormality. No abdominopelvic ascites. Musculoskeletal: No acute or significant osseous findings. IMPRESSION: 1. Cholelithiasis and gallbladder sludge with an additional small calcification adjacent to the gallbladder. A small gallstone within the common bile duct cannot be excluded. Correlation with right upper quadrant ultrasound is recommended. 2. Bilateral subcentimeter nonobstructing renal calculi. 3. Minimal stool burden within the distal sigmoid colon. Electronically Signed   By:  Virgina Norfolk M.D.   On: 08/01/2020 15:46   DG ABD ACUTE 2+V W 1V CHEST  Result Date: 08/01/2020 CLINICAL DATA:  Abdominal pain, constipation. EXAM: DG ABDOMEN ACUTE WITH 1 VIEW CHEST COMPARISON:  March 31, 2019. FINDINGS: There is no evidence of dilated bowel loops or free intraperitoneal air. Probable left renal calculus is noted. Heart size and mediastinal contours are within normal limits. Both lungs are clear. IMPRESSION: No evidence of bowel obstruction or ileus. Probable left renal calculus. No acute cardiopulmonary disease. Electronically Signed   By: Marijo Conception M.D.   On: 08/01/2020 14:58   US Abdomen Limited RUQ (LIVER/GB)  Result Date: 08/01/2020 CLINICAL DATA:  64 year old male with right upper quadrant abdominal pain. EXAM: ULTRASOUND ABDOMEN LIMITED RIGHT UPPER QUADRANT COMPARISON:  CT abdomen pelvis dated 08/01/2020. FINDINGS: Gallbladder: There is a stone or sludge ball within the gallbladder. No gallbladder wall thickening or pericholecystic fluid. Negative sonographic Murphy's sign. Common bile duct: Diameter: 3 mm Liver: The liver demonstrates a normal echogenicity. Apparent 1 cm echogenic lesion in the right lobe of the liver (67/85) may represent confluence of periportal fat. A liver lesion is less likely but not entirely excluded. Further evaluation with MRI or follow-up with ultrasound in 3 months recommended. Portal vein is patent on color Doppler imaging with normal direction of blood flow towards the liver. Other: None. IMPRESSION: 1. Gallbladder sludge or stone. No sonographic findings of acute cholecystitis. 2. Confluence periportal fat versus less likely an echogenic liver lesion. Further evaluation with MRI or follow-up with ultrasound in 3 months recommended. Electronically Signed   By: Anner Crete M.D.   On: 08/01/2020 17:17     CBC Recent Labs  Lab 08/01/20 1351 08/02/20 0601  WBC 7.1 5.8  HGB 13.3 10.0*  HCT 40.8 31.2*  PLT 273 198  MCV 92.7 92.9   MCH 30.2 29.8  MCHC 32.6 32.1  RDW 12.4 12.3  LYMPHSABS 1.2  --   MONOABS 0.4  --   EOSABS 0.0  --   BASOSABS 0.1  --     Chemistries  Recent Labs  Lab 08/01/20 1351 08/02/20 0601  NA 135 138  K 5.5* 4.5  CL 105 113*  CO2 15* 19*  GLUCOSE 180* 158*  BUN 94* 71*  CREATININE 3.39* 2.25*  CALCIUM 9.8 8.7*  AST 29  --   ALT 33  --   ALKPHOS 74  --   BILITOT 1.7*  --    ------------------------------------------------------------------------------------------------------------------ No results for input(s): CHOL, HDL, LDLCALC, TRIG, CHOLHDL, LDLDIRECT in the last 72 hours.  Lab Results  Component Value Date   HGBA1C 13.7 (H) 03/20/2019   ------------------------------------------------------------------------------------------------------------------ Recent Labs  08/02/20 0601  TSH 1.488   ------------------------------------------------------------------------------------------------------------------ No results for input(s): VITAMINB12, FOLATE, FERRITIN, TIBC, IRON, RETICCTPCT in the last 72 hours.  Coagulation profile No results for input(s): INR, PROTIME in the last 168 hours.  No results for input(s): DDIMER in the last 72 hours.  Cardiac Enzymes No results for input(s): CKMB, TROPONINI, MYOGLOBIN in the last 168 hours.  Invalid input(s): CK ------------------------------------------------------------------------------------------------------------------ No results found for: BNP   Roxan Hockey M.D on 08/02/2020 at 6:43 PM  Go to [email or web address] - for contact info  [facility] - Office  [phone]

## 2020-08-02 NOTE — Progress Notes (Signed)
Pt states has had 2 BM's this am, small lumps with liquid, pt states this is normal for him. PT states abd feels much better, "lighter" and doesn't feel constipated or nauseated. Tolerated oral liquids this am without n/v. Abd soft and non-tender to palpation.

## 2020-08-03 LAB — RENAL FUNCTION PANEL
Albumin: 3 g/dL — ABNORMAL LOW (ref 3.5–5.0)
Anion gap: 7 (ref 5–15)
BUN: 33 mg/dL — ABNORMAL HIGH (ref 8–23)
CO2: 20 mmol/L — ABNORMAL LOW (ref 22–32)
Calcium: 8.6 mg/dL — ABNORMAL LOW (ref 8.9–10.3)
Chloride: 111 mmol/L (ref 98–111)
Creatinine, Ser: 1.51 mg/dL — ABNORMAL HIGH (ref 0.61–1.24)
GFR, Estimated: 51 mL/min — ABNORMAL LOW (ref >60)
Glucose, Bld: 115 mg/dL — ABNORMAL HIGH (ref 70–99)
Phosphorus: 2.4 mg/dL — ABNORMAL LOW (ref 2.5–4.6)
Potassium: 3.6 mmol/L (ref 3.5–5.1)
Sodium: 138 mmol/L (ref 135–145)

## 2020-08-03 LAB — GLUCOSE, CAPILLARY
Glucose-Capillary: 105 mg/dL — ABNORMAL HIGH (ref 70–99)
Glucose-Capillary: 111 mg/dL — ABNORMAL HIGH (ref 70–99)
Glucose-Capillary: 118 mg/dL — ABNORMAL HIGH (ref 70–99)
Glucose-Capillary: 92 mg/dL (ref 70–99)

## 2020-08-03 LAB — HEMOGLOBIN A1C
Hgb A1c MFr Bld: 6.2 % — ABNORMAL HIGH (ref 4.8–5.6)
Mean Plasma Glucose: 131 mg/dL

## 2020-08-03 MED ORDER — POLYETHYLENE GLYCOL 3350 17 G PO PACK: 17.0000 g | PACK | Freq: Every day | ORAL | 3 refills | Status: DC

## 2020-08-03 MED ORDER — ASPIRIN 325 MG PO TABS: 325.0000 mg | ORAL_TABLET | Freq: Every day | ORAL | 11 refills | Status: DC

## 2020-08-03 MED ORDER — SENNOSIDES-DOCUSATE SODIUM 8.6-50 MG PO TABS: 2.0000 | ORAL_TABLET | Freq: Every day | ORAL | 3 refills | Status: DC

## 2020-08-03 MED ORDER — K PHOS MONO-SOD PHOS DI & MONO 155-852-130 MG PO TABS
250.0000 mg | ORAL_TABLET | Freq: Three times a day (TID) | ORAL | Status: DC
  Administered 2020-08-03: 250 mg via ORAL
  Filled 2020-08-03: qty 1

## 2020-08-03 MED ORDER — METOPROLOL SUCCINATE ER 25 MG PO TB24: 25.0000 mg | ORAL_TABLET | Freq: Every day | ORAL | 11 refills | Status: AC

## 2020-08-03 MED ORDER — ATORVASTATIN CALCIUM 40 MG PO TABS
40.0000 mg | ORAL_TABLET | Freq: Every evening | ORAL | 11 refills | Status: AC
Start: 2020-08-03 — End: ?

## 2020-08-03 NOTE — Discharge Summary (Signed)
Alec Bruce, is a 65 y.o. male  DOB 1955/04/27  MRN [id number].  Admission date:  08/01/2020  Admitting Physician  Roxan Hockey, MD  Discharge Date:  08/03/2020   Primary MD  Health, [facility]  Recommendations for primary care physician for things to follow:   1)Avoid ibuprofen/Advil/Aleve/Motrin/Goody Powders/Naproxen/BC powders/Meloxicam/Diclofenac/Indomethacin and other Nonsteroidal anti-inflammatory medications as these will make you more likely to bleed and can cause stomach ulcers, can also cause Kidney problems.   2) avoid constipation---  3) repeat CBC and BMP blood test with the primary care physician in about a week or so advised  4)-you have gallstones-- consider outpatient follow-up with general surgeon for possible cholecystectomy/gallbladder removal surgery if you develop persistent nausea, vomiting, fevers or pain in your right upper quadrant area (Below right Rib cage)   Admission Diagnosis  Hyperkalemia [E87.5] RUQ abdominal pain [R10.11] AKI (acute kidney injury) ([place]) [N17.9] Gallbladder sludge [K82.8] Constipation, unspecified constipation type [K59.00]   Discharge Diagnosis  Hyperkalemia [E87.5] RUQ abdominal pain [R10.11] AKI (acute kidney injury) ([place]) [N17.9] Gallbladder sludge [K82.8] Constipation, unspecified constipation type [K59.00]    Principal Problem:   AKI (acute kidney injury) ([place]) Active Problems:   Uncontrolled type 2 diabetes mellitus with hyperglycemia, without long-term current use of insulin ([place])   HTN (hypertension)   Colon cancer ([place])      Past Medical History:  Diagnosis Date   Cancer ([place])    colon cancer 90s   Diabetes mellitus without complication ([place])    Hypertension    Stroke ([place])    TIA    Past Surgical History:  Procedure Laterality Date   APPENDECTOMY       HPI  from the history and physical done on the  day of admission:      Chief Complaint: Abdominal discomfort   HPI: Alec Bruce is a 65 y.o. male with medical history significant for diabetes mellitus, stroke, hypertension, cancer s/p bowel resection. Patient presented to the ED with complaints of generalized abdominal discomfort, constipation of 4 days duration.  He denies actual abdominal pain but reports discomfort over the past few days, reports his last good meal was 3 days ago, since then he has thrown up everything he tried to eat or drink.  He reports he would drop about 15 minutes after eating.  He felt he was constipated as he has had similar symptoms in the improved with MiraLAX, which she tried without improvement.   ED Course: Tachycardic to 129.  Temperature 97.9.  Pressure systolic 1 59-1 63W.  WBC 7.1.  Lipase within normal limits 40.  Creatinine elevated 3.39.  Potassium 5.5.  Abdominal CT shows cholelithiasis and gallbladder sludge.  Right upper quadrant ultrasound-gallbladder sludge or stone. EDP talked with Dr. Constance Haw general surgeon, patient to be seen in the morning. Lokelma 10 mg x 1 given. 2 L bolus given, Hospitalist to admit.   Hospital Course:   Brief Summary:- 65 y.o. 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 -Baseline creatinine usually 1.2-1.6 -Creatinine is down to 1.51 from 3.39 on admission with hydration -renally adjust medications, avoid nephrotoxic agents / dehydration  / hypotension -Avoid dehydration   2)Constipation with nausea vomiting--- patient had multiple BMs after laxatives -discharged on MiraLAX and Senokot-S   3)Cholelithiasis/Gallbladder/----CT abdomen and ultrasound of the right upper quadrant noted  --no evidence of acute cholecystitis at this time  outpatient follow-up with general surgery for possible elective cholecystectomy advised -Largely asymptomatic from gallbladder standpoint at this time   4)DM2-A1c 6.2 reflecting excellent diabetic control PTA -Continue Januvia   5)HTN-stable, continue Toprol-XL  6) acute anemia--hemoglobin down to 10.0 post hydration suspect some hemodilutional component, no evidence of ongoing bleeding  Disposition--home   Disposition: The patient is from: Home              Anticipated d/c is to: Home              Code Status :  -  Code Status: Full Code    Family Communication:    NA (patient is alert, awake and coherent)   Discharge Condition: stable  Follow UP   [place], [facility] Follow up in 1 week(s).   Why: Repeat CBC and BMP Blood test Contact information: [address] [place] [address] [phone]                 Consults obtained - na  Diet and Activity recommendation:  As advised  Discharge Instructions   Discharge Instructions     Call MD for:  difficulty breathing, headache or visual disturbances   Complete by: As directed    Call MD for:  persistant dizziness or light-headedness   Complete by: As directed    Call MD for:  persistant nausea and vomiting   Complete by: As directed    Call MD for:  temperature >100.4   Complete by: As directed    Diet - low sodium heart healthy   Complete by: As directed    Discharge instructions   Complete by: As directed    1)Avoid ibuprofen/Advil/Aleve/Motrin/Goody Powders/Naproxen/BC powders/Meloxicam/Diclofenac/Indomethacin and other Nonsteroidal anti-inflammatory medications as these will make you more likely to bleed and can cause stomach ulcers, can also cause Kidney problems.   2) avoid constipation---  3) repeat CBC and BMP blood test with the primary care physician in about a week or so advised  4)-you have gallstones-- consider outpatient follow-up with general surgeon  for possible cholecystectomy/gallbladder removal surgery if you develop persistent nausea, vomiting, fevers or pain in your right upper quadrant area (Below right Rib cage)   Increase activity slowly   Complete by: As directed         Discharge Medications     Allergies as of 08/03/2020   No Known Allergies      Medication List     STOP taking these medications    glipiZIDE 10 MG tablet Commonly known as: GLUCOTROL   metoprolol tartrate 25 MG tablet Commonly known as: LOPRESSOR       TAKE these medications    aspirin 325 MG tablet Take 1 tablet (325 mg total) by mouth daily with breakfast. For stroke prevention What changed: Another medication with the same name was removed. Continue taking this medication, and follow the directions you see here.  atorvastatin 40 MG tablet Commonly known as: LIPITOR Take 1 tablet (40 mg total) by mouth every evening. For stroke prevention   CVS VIT D 5000 HIGH-POTENCY PO Take 5,000 mcg by mouth daily.   DULoxetine 60 MG capsule Commonly known as: CYMBALTA Take 60 mg by mouth at bedtime.   ferrous sulfate 325 (65 FE) MG tablet Take 325 mg by mouth daily with breakfast.   metoprolol succinate 25 MG 24 hr tablet Commonly known as: Toprol XL Take 1 tablet (25 mg total) by mouth daily.   polyethylene glycol 17 g packet Commonly known as: MIRALAX / GLYCOLAX Take 17 g by mouth daily. What changed:  when to take this reasons to take this   senna-docusate 8.6-50 MG tablet Commonly known as: Senokot-S Take 2 tablets by mouth at bedtime.   sitaGLIPtin 100 MG tablet Commonly known as: Januvia Take 1 tablet (100 mg total) by mouth daily.       ASK your doctor about these medications    acetaminophen 325 MG tablet Commonly known as: TYLENOL Take 2 tablets (650 mg total) by mouth every 6 (six) hours as needed for mild pain (or Fever >/= 101).       Major procedures and Radiology Reports - PLEASE review detailed and  final reports for all details, in brief -  CT Abdomen Pelvis Wo Contrast  Result Date: 08/01/2020 CLINICAL DATA:  Abdominal pain and constipation. EXAM: CT ABDOMEN AND PELVIS WITHOUT CONTRAST TECHNIQUE: Multidetector CT imaging of the abdomen and pelvis was performed following the standard protocol without IV contrast. COMPARISON:  None. FINDINGS: Lower chest: No acute abnormality. Hepatobiliary: No focal liver abnormality is seen. Gallbladder sludge and a subcentimeter gallstone are seen within the gallbladder lumen, without evidence of gallbladder wall thickening or biliary dilatation. A 3 mm calcification is seen adjacent to the superomedial aspect of the gallbladder (axial CT image 18, CT series 2). Pancreas: Unremarkable. No pancreatic ductal dilatation or surrounding inflammatory changes. Spleen: Normal in size without focal abnormality. Adrenals/Urinary Tract: Adrenal glands are unremarkable. Kidneys are normal in size. 2 mm nonobstructing renal stones are seen within the right kidney. A 6 mm nonobstructing renal stone is seen within the left kidney. A 2.2 cm x 1.9 cm cyst is noted within the lateral aspect of the mid to lower left kidney. Bladder is unremarkable. Stomach/Bowel: Stomach is within normal limits. The appendix is not identified. A mild amount of stool is seen within the sigmoid colon. No evidence of bowel wall thickening, distention, or inflammatory changes. Vascular/Lymphatic: Aortic atherosclerosis. No enlarged abdominal or pelvic lymph nodes. Reproductive: Prostate is unremarkable. Other: No abdominal wall hernia or abnormality. No abdominopelvic ascites. Musculoskeletal: No acute or significant osseous findings. IMPRESSION: 1. Cholelithiasis and gallbladder sludge with an additional small calcification adjacent to the gallbladder. A small gallstone within the common bile duct cannot be excluded. Correlation with right upper quadrant ultrasound is recommended. 2. Bilateral subcentimeter  nonobstructing renal calculi. 3. Minimal stool burden within the distal sigmoid colon. Electronically Signed   By: Virgina Norfolk M.D.   On: 08/01/2020 15:46   DG ABD ACUTE 2+V W 1V CHEST  Result Date: 08/01/2020 CLINICAL DATA:  Abdominal pain, constipation. EXAM: DG ABDOMEN ACUTE WITH 1 VIEW CHEST COMPARISON:  March 31, 2019. FINDINGS: There is no evidence of dilated bowel loops or free intraperitoneal air. Probable left renal calculus is noted. Heart size and mediastinal contours are within normal limits. Both lungs are clear. IMPRESSION: No evidence of bowel obstruction or ileus.  Probable left renal calculus. No acute cardiopulmonary disease. Electronically Signed   By: Marijo Conception M.D.   On: 08/01/2020 14:58   US Abdomen Limited RUQ (LIVER/GB)  Result Date: 08/01/2020 CLINICAL DATA:  65 year old male with right upper quadrant abdominal pain. EXAM: ULTRASOUND ABDOMEN LIMITED RIGHT UPPER QUADRANT COMPARISON:  CT abdomen pelvis dated 08/01/2020. FINDINGS: Gallbladder: There is a stone or sludge ball within the gallbladder. No gallbladder wall thickening or pericholecystic fluid. Negative sonographic Murphy's sign. Common bile duct: Diameter: 3 mm Liver: The liver demonstrates a normal echogenicity. Apparent 1 cm echogenic lesion in the right lobe of the liver (67/85) may represent confluence of periportal fat. A liver lesion is less likely but not entirely excluded. Further evaluation with MRI or follow-up with ultrasound in 3 months recommended. Portal vein is patent on color Doppler imaging with normal direction of blood flow towards the liver. Other: None. IMPRESSION: 1. Gallbladder sludge or stone. No sonographic findings of acute cholecystitis. 2. Confluence periportal fat versus less likely an echogenic liver lesion. Further evaluation with MRI or follow-up with ultrasound in 3 months recommended. Electronically Signed   By: Anner Crete M.D.   On: 08/01/2020 17:17    Micro Results    Recent Results (from the past 240 hour(s))  SARS CORONAVIRUS 2 (TAT 6-24 HRS) Nasopharyngeal Nasopharyngeal Swab     Status: None   Collection Time: 08/01/20  3:23 PM   Specimen: Nasopharyngeal Swab  Result Value Ref Range Status   SARS Coronavirus 2 NEGATIVE NEGATIVE Final    Comment: (NOTE) SARS-CoV-2 target nucleic acids are NOT DETECTED.  The SARS-CoV-2 RNA is generally detectable in upper and lower respiratory specimens during the acute phase of infection. Negative results do not preclude SARS-CoV-2 infection, do not rule out co-infections with other pathogens, and should not be used as the sole basis for treatment or other patient management decisions. Negative results must be combined with clinical observations, patient history, and epidemiological information. The expected result is Negative.  Fact Sheet for Patients: SugarRoll.be  Fact Sheet for Healthcare Providers: https://www.woods-mathews.com/  This test is not yet approved or cleared by the [place] FDA and  has been authorized for detection and/or diagnosis of SARS-CoV-2 by FDA under an Emergency Use Authorization (EUA). This EUA will remain  in effect (meaning this test can be used) for the duration of the COVID-19 declaration under Se ction 564(b)(1) of the Act, 21 U.S.C. section 360bbb-3(b)(1), unless the authorization is terminated or revoked sooner.  Performed at [facility], [place] [address] [place], [place] [address]    Today   Subjective    Alec Bruce today has no new complaints   -eating and drinking well Voiding well  No Nausea, Vomiting or Diarrhea  -Had multiple soft BMs        Patient has been seen and examined prior to discharge   Objective   Blood pressure 126/76, pulse 91, temperature 98.1 F (36.7 C), temperature source Oral, resp. rate 18, height 6' (1.829 m), weight 56.3 kg, SpO2 100 %.   Intake/Output Summary (Last  24 hours) at 08/03/2020 1112 Last data filed at 08/03/2020 0900 Gross per 24 hour  Intake 480 ml  Output 600 ml  Net -120 ml   Exam Gen:- Awake Alert,  in no apparent distress  HEENT:- [place].AT, No sclera icterus Neck-Supple Neck,No JVD,. Lungs-  CTAB , fair symmetrical air movement CV- S1, S2 normal, regular Abd-  +ve B.Sounds, Abd Soft, No RUQ tenderness, no  CVA area tenderness Extremity/Skin:- No  edema, pedal pulses present Psych-affect is appropriate, oriented x3 Neuro-no new focal deficits, no tremors   Data Review   CBC w Diff:  Lab Results  Component Value Date   WBC 5.8 08/02/2020   HGB 10.0 (L) 08/02/2020   HCT 31.2 (L) 08/02/2020   PLT 198 08/02/2020   LYMPHOPCT 17 08/01/2020   MONOPCT 5 08/01/2020   EOSPCT 0 08/01/2020   BASOPCT 1 08/01/2020   CMP:  Lab Results  Component Value Date   NA 138 08/03/2020   K 3.6 08/03/2020   CL 111 08/03/2020   CO2 20 (L) 08/03/2020   BUN 33 (H) 08/03/2020   CREATININE 1.51 (H) 08/03/2020   PROT 7.9 08/01/2020   ALBUMIN 3.0 (L) 08/03/2020   BILITOT 1.7 (H) 08/01/2020   ALKPHOS 74 08/01/2020   AST 29 08/01/2020   ALT 33 08/01/2020   Total Discharge time is about 33 minutes  Roxan Hockey M.D on 08/03/2020 at 11:12 AM  Go to [email or web address] -  for contact info  [facility] - Office  [phone]

## 2020-08-03 NOTE — Discharge Instructions (Signed)
1)Avoid ibuprofen/Advil/Aleve/Motrin/Goody Powders/Naproxen/BC powders/Meloxicam/Diclofenac/Indomethacin and other Nonsteroidal anti-inflammatory medications as these will make you more likely to bleed and can cause stomach ulcers, can also cause Kidney problems.   2) avoid constipation---  3) repeat CBC and BMP blood test with the primary care physician in about a week or so advised  4)-you have gallstones-- consider outpatient follow-up with general surgeon for possible cholecystectomy/gallbladder removal surgery if you develop persistent nausea, vomiting, fevers or pain in your right upper quadrant area (Below right Rib cage)

## 2020-12-31 ENCOUNTER — Other Ambulatory Visit: Payer: Self-pay | Admitting: Family Medicine

## 2020-12-31 ENCOUNTER — Other Ambulatory Visit (HOSPITAL_COMMUNITY): Payer: Self-pay | Admitting: Family Medicine

## 2020-12-31 DIAGNOSIS — K769 Liver disease, unspecified: Secondary | ICD-10-CM

## 2021-01-06 ENCOUNTER — Encounter: Payer: Self-pay | Admitting: Internal Medicine

## 2021-01-16 ENCOUNTER — Other Ambulatory Visit: Payer: Self-pay

## 2021-01-16 ENCOUNTER — Ambulatory Visit (HOSPITAL_COMMUNITY)
Admission: RE | Admit: 2021-01-16 | Discharge: 2021-01-16 | Disposition: A | Discharge status: Home / Self Care | Payer: Medicare Other | Source: Ambulatory Visit | Attending: Family Medicine | Admitting: Family Medicine

## 2021-01-16 DIAGNOSIS — K769 Liver disease, unspecified: Secondary | ICD-10-CM | POA: Diagnosis present

## 2021-01-16 IMAGING — MR MR ABDOMEN WO/W CM
20 series · 48 of 48 positions shown · IV contrast (gadavist)
Comparison: None.

CLINICAL DATA: Abnormal ultrasound. MRI recommended for further
evaluation. History of colon cancer.

EXAM:
MRI ABDOMEN WITHOUT AND WITH CONTRAST
TECHNIQUE: Multiplanar multisequence MR imaging of the abdomen was performed
both before and after the administration of intravenous contrast.
CONTRAST:  7mL GADAVIST GADOBUTROL 1 MMOL/ML IV SOLN

[Series 3: cor haste · coronal · 6.0mm · 1.19mm/px · 1 of 34 slices shown]
[im 1/34]
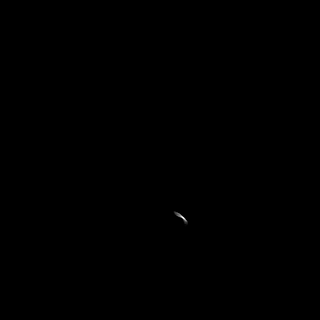

[Series 4: ax haste · axial · 6.0mm · 1.09mm/px · 1 of 30 slices shown]
[im 1/30]
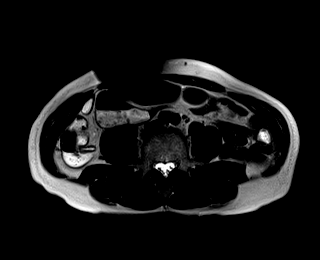

[Series 7: T2 fat-sat · axial · 6.0mm · 1.19mm/px · 1 of 30 slices shown]
[im 1/30]
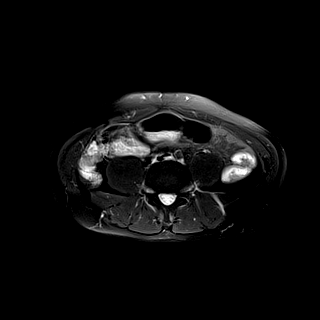

[Series 8: DWI · axial · 6.0mm · 1.31mm/px · 1 of 40 slices shown (1 of 4)]
[im 1/40]
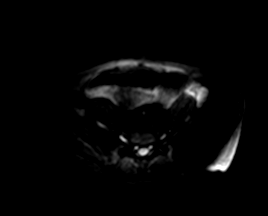

[Series 8: DWI · axial · 6.0mm · 1.31mm/px · z∈[-225,+56]mm · 2 of 40 slices shown (2 of 4)]
[im 1/40]
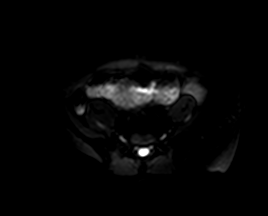
[im 40/40]
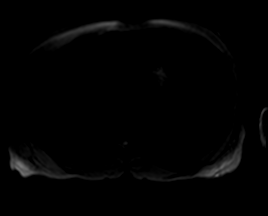

[Series 8: DWI · axial · 6.0mm · 1.31mm/px · z∈[-225,+56]mm · 2 of 40 slices shown (3 of 4)]
[im 1/40]
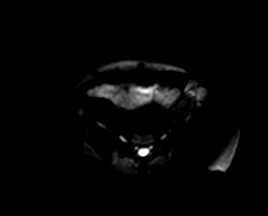
[im 40/40]
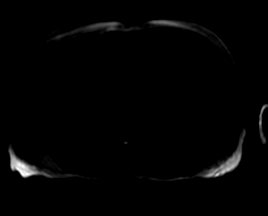

[Series 9: DWI · axial · 6.0mm · 1.31mm/px · z∈[-225,+56]mm · 2 of 40 slices shown (4 of 4)]
[im 1/40]
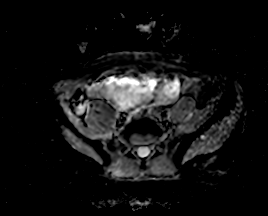
[im 40/40]
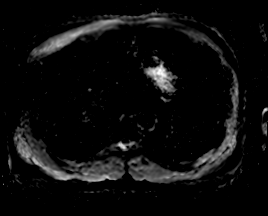

[Series 10: bSSFP · axial · 6.0mm · 0.68mm/px · z∈[-196,+26]mm · 2 of 38 slices shown]
[im 1/38]
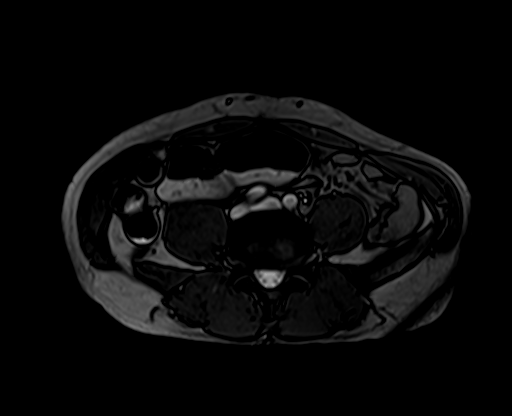
[im 38/38]
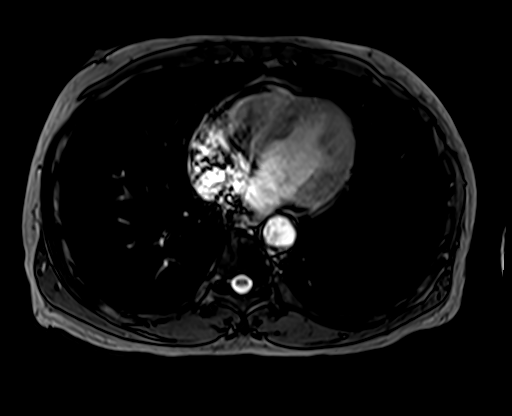

[Series 11: ax in and · axial · 3.0mm · 1.19mm/px · z∈[-191,+22]mm · 3 of 72 slices shown (1 of 2)]
[im 1/72]
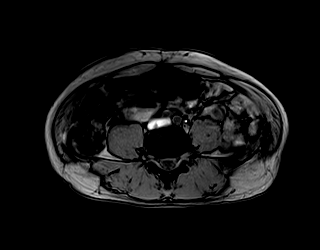
[im 36/72]
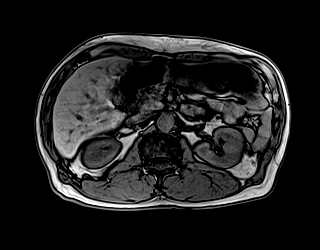
[im 72/72]
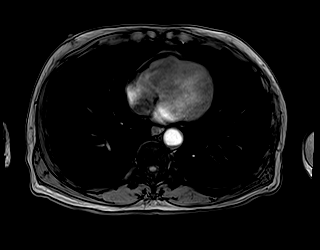

[Series 12: ax in and · axial · 3.0mm · 1.19mm/px · z∈[-191,+22]mm · 3 of 72 slices shown (2 of 2)]
[im 1/72]
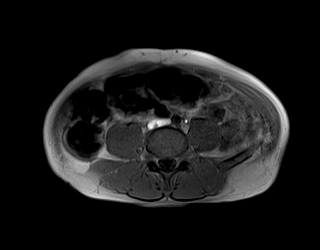
[im 36/72]
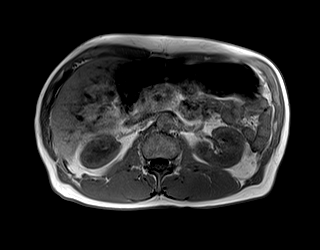
[im 72/72]
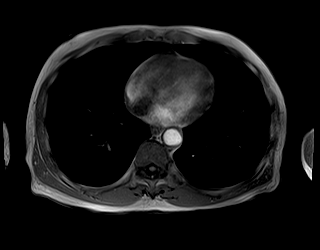

[Series 13: T1 dynamic · axial · non-contrast · 3.0mm · 1.19mm/px · z∈[-191,+22]mm · 3 of 72 slices shown (1 of 4)]
[im 1/72]
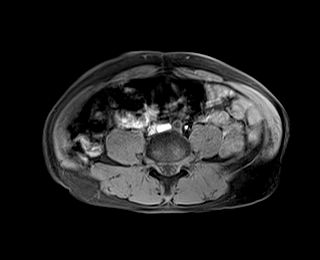
[im 36/72]
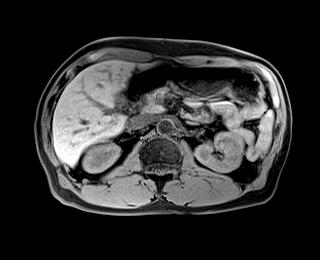
[im 72/72]
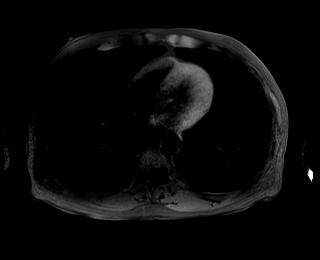

[Series 15: T1 dynamic post-contrast · axial · 3.0mm · 1.19mm/px · z∈[-191,+22]mm · 3 of 72 slices shown (1 of 6)]
[im 1/72]
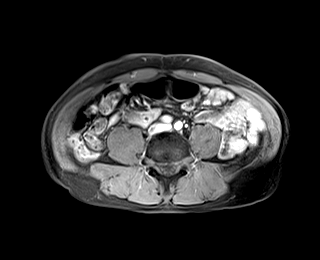
[im 36/72]
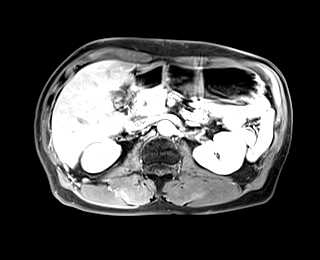
[im 72/72]
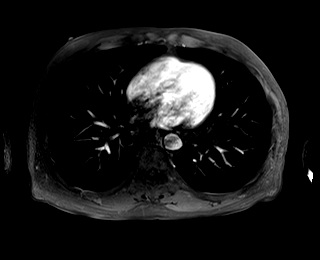

[Series 16: T1 dynamic · axial · 3.0mm · 1.19mm/px · z∈[-191,+22]mm · 3 of 72 slices shown (2 of 4)]
[im 1/72]
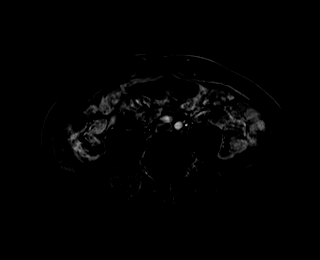
[im 36/72]
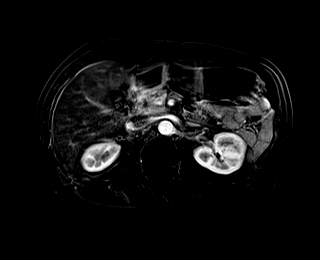
[im 72/72]
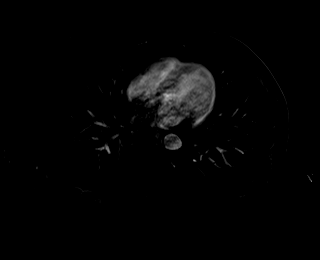

[Series 17: T1 dynamic post-contrast · axial · 3.0mm · 1.19mm/px · z∈[-191,+22]mm · 3 of 72 slices shown (2 of 6)]
[im 1/72]
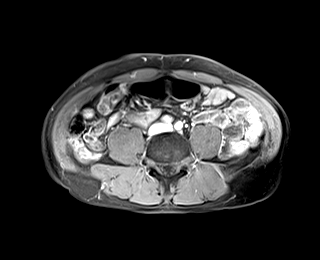
[im 36/72]
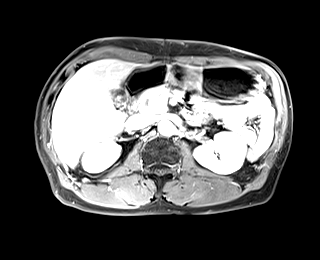
[im 72/72]
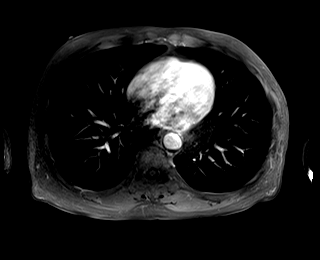

[Series 18: T1 dynamic · axial · 3.0mm · 1.19mm/px · z∈[-191,+22]mm · 3 of 72 slices shown (3 of 4)]
[im 1/72]
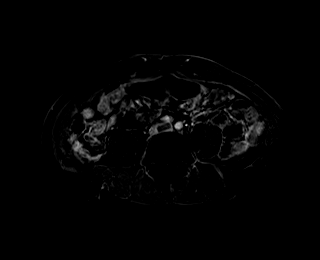
[im 36/72]
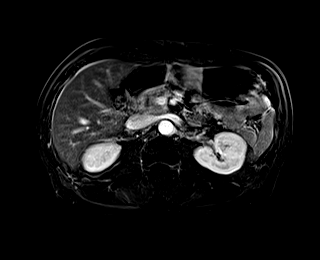
[im 72/72]
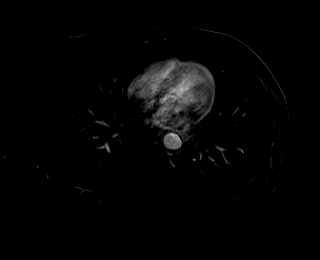

[Series 19: T1 dynamic post-contrast · axial · 3.0mm · 1.19mm/px · z∈[-191,+22]mm · 3 of 72 slices shown (3 of 6)]
[im 1/72]
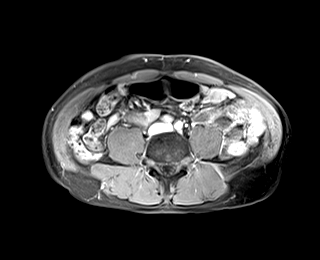
[im 36/72]
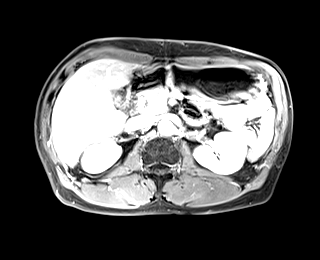
[im 72/72]
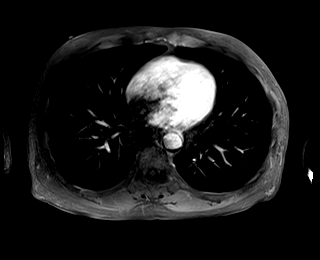

[Series 20: T1 dynamic · axial · 3.0mm · 1.19mm/px · z∈[-191,+22]mm · 3 of 72 slices shown (4 of 4)]
[im 1/72]
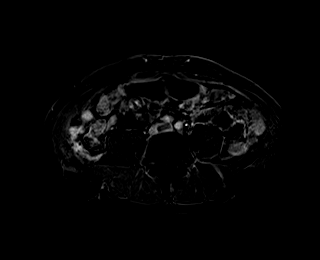
[im 36/72]
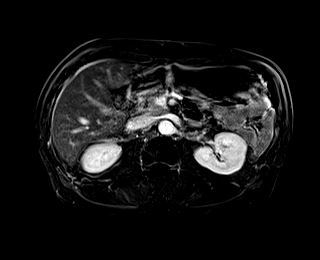
[im 72/72]
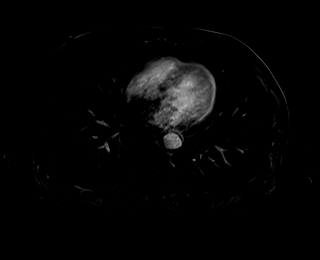

[Series 21: T1 dynamic post-contrast · coronal · 3.0mm · 1.19mm/px · 3 of 80 slices shown (4 of 6)]
[im 1/80]
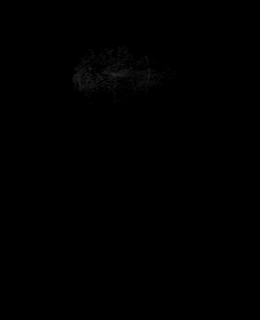
[im 40/80]
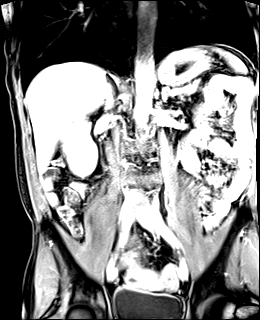
[im 80/80]
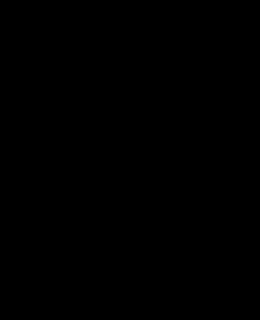

[Series 22: T1 dynamic post-contrast · axial · 3.0mm · 1.19mm/px · z∈[-191,+22]mm · 3 of 72 slices shown (5 of 6)]
[im 1/72]
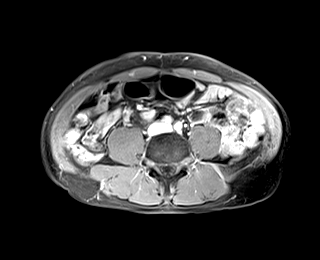
[im 36/72]
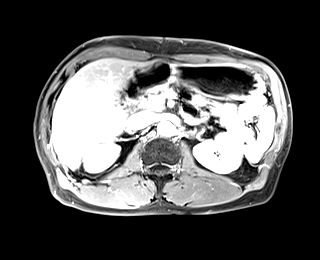
[im 72/72]
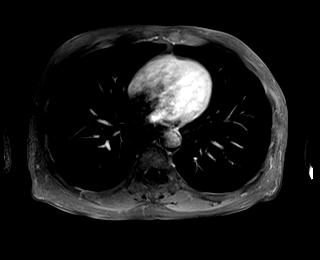

[Series 23: T1 dynamic post-contrast · axial · 3.0mm · 1.19mm/px · z∈[-191,+22]mm · 3 of 72 slices shown (6 of 6)]
[im 1/72]
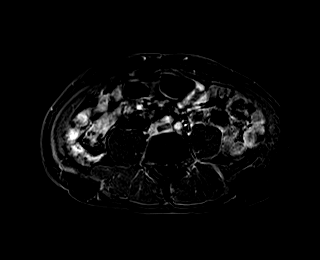
[im 36/72]
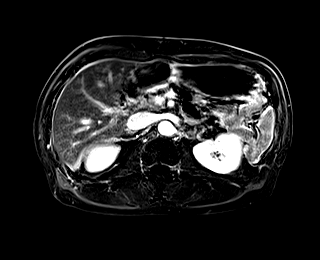
[im 72/72]
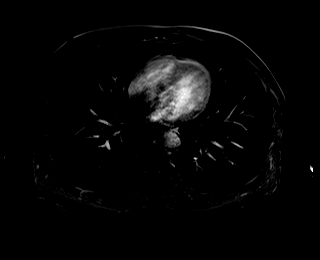

[48 of 48 positions shown; findings below may reference images not displayed]

FINDINGS: Lower chest: Lung bases are clear.

Hepatobiliary: 2 adjacent lesions in the posterior RIGHT hepatic
lobe measure 1 cm each on image 8/series 4. These lesions are
hyperintense on T2 weighted imaging and corresponds to the abnormal
lesion on comparison CT ultrasound.

Lesions demonstrate low signal intensity on precontrast T1 weighted
imaging and delayed filling on posterior arterial phase imaging.
Findings are most consistent benign hemangioma.

No additional hepatic lesions.

Small stones in the gallbladder.

Pancreas: Pancreas is normal. No ductal dilatation. No pancreatic
inflammation.

Spleen: Normal spleen

Adrenals/urinary tract: Adrenal glands and kidneys are normal.

Stomach/Bowel: Stomach and limited view of the bowel is
unremarkable.

Vascular/Lymphatic: Abdominal aorta is normal caliber. No periportal
or retroperitoneal adenopathy.

Other: No free fluid.

Musculoskeletal: No aggressive osseous lesion.
IMPRESSION: 1. Two small benign hemangioma in the posterior RIGHT hepatic lobe.
Findings correspond to indeterminate lesion on ultrasound.
2. Gallstones without evidence cholecystitis.

## 2021-01-16 MED ORDER — GADOBUTROL 1 MMOL/ML IV SOLN
7.0000 mL | Freq: Once | INTRAVENOUS | Status: AC | PRN
  Administered 2021-01-16: 7 mL via INTRAVENOUS

## 2021-02-19 ENCOUNTER — Other Ambulatory Visit: Payer: Self-pay

## 2021-02-19 ENCOUNTER — Ambulatory Visit (INDEPENDENT_AMBULATORY_CARE_PROVIDER_SITE_OTHER): Payer: Self-pay | Admitting: *Deleted

## 2021-02-19 VITALS — Ht 72.0 in | Wt 137.0 lb

## 2021-02-19 DIAGNOSIS — Z1211 Encounter for screening for malignant neoplasm of colon: Secondary | ICD-10-CM

## 2021-02-19 NOTE — Progress Notes (Addendum)
Gastroenterology Pre-Procedure Review  Request Date: 02/19/2021 Requesting Physician: Dr. Nevada Crane, Last TCS done mid 90's, no polyps per pt, hx of colon cancer early 90's, pt says he had part of colon removed by Dr. Velora Heckler, family hx of colon cancer (mother and cousin)  PATIENT REVIEW QUESTIONS: The patient responded to the following health history questions as indicated:    1. Diabetes Melitis: yes, type II  2. Joint replacements in the past 12 months: no 3. Major health problems in the past 3 months: no 4. Has an artificial valve or MVP: no 5. Has a defibrillator: no 6. Has been advised in past to take antibiotics in advance of a procedure like teeth cleaning: no 7. Family history of colon cancer: yes, mother: age 51's , cousin: age 21's 8. Alcohol Use: no 9. Illicit drug Use: no 10. History of sleep apnea: no  11. History of coronary artery or other vascular stents placed within the last 12 months: no 12. History of any prior anesthesia complications: no 13. Body mass index is 18.58 kg/m.    MEDICATIONS & ALLERGIES:    Patient reports the following regarding taking any blood thinners:   Plavix? no Aspirin? Yes, 325 mg daily Coumadin? no Brilinta? no Xarelto? no Eliquis? no Pradaxa? no Savaysa? no Effient? no  Patient confirms/reports the following medications:  Current Outpatient Medications  Medication Sig Dispense Refill   aspirin 325 MG tablet Take 1 tablet (325 mg total) by mouth daily with breakfast. For stroke prevention 30 tablet 11   atorvastatin (LIPITOR) 40 MG tablet Take 1 tablet (40 mg total) by mouth every evening. For stroke prevention 30 tablet 11   Cholecalciferol (CVS VIT D 5000 HIGH-POTENCY PO) Take 5,000 mcg by mouth daily.     ferrous sulfate 325 (65 FE) MG tablet Take 325 mg by mouth daily with breakfast.     metoprolol succinate (TOPROL XL) 25 MG 24 hr tablet Take 1 tablet (25 mg total) by mouth daily. 30 tablet 11   polyethylene glycol (MIRALAX /  GLYCOLAX) 17 g packet Take 17 g by mouth daily. (Patient taking differently: Take 17 g by mouth as needed.) 30 each 3   senna-docusate (SENOKOT-S) 8.6-50 MG tablet Take 2 tablets by mouth at bedtime. (Patient taking differently: Take 2 tablets by mouth as needed.) 60 tablet 3   sitaGLIPtin (JANUVIA) 100 MG tablet Take 1 tablet (100 mg total) by mouth daily. 30 tablet 11   DULoxetine (CYMBALTA) 60 MG capsule Take 60 mg by mouth at bedtime. (Patient not taking: Reported on 02/19/2021)     No current facility-administered medications for this visit.    Patient confirms/reports the following allergies:  No Known Allergies  No orders of the defined types were placed in this encounter.   AUTHORIZATION INFORMATION Primary Insurance: [facility] Medicare,  ID #: [id number],  Group #: [id number] Pre-Cert / Auth required: Yes, approved online [id number] Pre-Cert / [name] #: [id number]  SCHEDULE INFORMATION: Procedure has been scheduled as follows:  Date: 03/31/2021, Time: 10:00  Location: [facility] with Dr. Abbey Chatters  This Gastroenterology Pre-Precedure Review Form is being routed to the following provider(s): Roseanne Kaufman, NP

## 2021-03-05 ENCOUNTER — Encounter: Payer: Self-pay | Admitting: *Deleted

## 2021-03-05 ENCOUNTER — Telehealth: Payer: Self-pay | Admitting: Internal Medicine

## 2021-03-05 MED ORDER — BISACODYL EC 5 MG PO TBEC: 5.0000 mg | DELAYED_RELEASE_TABLET | Freq: Once | ORAL | 0 refills | Status: DC

## 2021-03-05 MED ORDER — BISACODYL EC 5 MG PO TBEC: 5.0000 mg | DELAYED_RELEASE_TABLET | Freq: Once | ORAL | 0 refills | Status: AC

## 2021-03-05 MED ORDER — PEG 3350-KCL-NA BICARB-NACL 420 G PO SOLR: 4000.0000 mL | Freq: Once | ORAL | 0 refills | Status: DC

## 2021-03-05 MED ORDER — FLEET ENEMA 7-19 GM/118ML RE ENEM: 1.0000 | ENEMA | Freq: Once | RECTAL | 0 refills | Status: AC

## 2021-03-05 MED ORDER — FLEET ENEMA 7-19 GM/118ML RE ENEM: 1.0000 | ENEMA | Freq: Once | RECTAL | 0 refills | Status: DC

## 2021-03-05 MED ORDER — PEG 3350-KCL-NA BICARB-NACL 420 G PO SOLR: 4000.0000 mL | Freq: Once | ORAL | 0 refills | Status: AC

## 2021-03-05 NOTE — Progress Notes (Signed)
Spoke with Roseanne Kaufman, NP.  Pt will need to hold Iron 10 days prior to procedure.  Pt made aware and was also informed of Pre-op appointment.

## 2021-03-05 NOTE — Progress Notes (Signed)
Does pt need to hold Iron 10 days prior to procedure?

## 2021-03-05 NOTE — Telephone Encounter (Signed)
Noted.  Sent RX to [facility] as requested for home delivery.

## 2021-03-05 NOTE — Addendum Note (Signed)
Addended by: Metro Kung on: 03/05/2021 04:03 PM   Modules accepted: Orders

## 2021-03-05 NOTE — Progress Notes (Signed)
Recommend decreasing aspirin to 81 mg daily, 5 days prior. ASA 3. No oral diabetes meds day of.

## 2021-03-05 NOTE — Addendum Note (Signed)
Addended by: Metro Kung on: 03/05/2021 04:08 PM   Modules accepted: Orders

## 2021-03-05 NOTE — Progress Notes (Signed)
Spoke to pt.  Scheduled procedure for 03/31/2021.  Pt aware that he needs Pre-op appointment.  He was made aware that they will give him procedure time at Pre-op appointment.  Reviewed prep instructions with pt by telephone.  Pt aware that I am mailing out prep instructions.  Confirmed mailing address.  Pt informed to decrease aspirin to 81 mg daily, 5 days prior to procedure. Pt informed no oral diabetes meds day of procedure.   Pt voiced understanding.  Mailed letter along with instructions.

## 2021-03-05 NOTE — Addendum Note (Signed)
Addended by: Metro Kung on: 03/05/2021 04:06 PM   Modules accepted: Orders

## 2021-03-05 NOTE — Telephone Encounter (Signed)
Pt wanted triage nurse to know that he uses [facility].

## 2021-03-06 ENCOUNTER — Other Ambulatory Visit: Payer: Self-pay | Admitting: *Deleted

## 2021-03-26 NOTE — Patient Instructions (Signed)
Alec Bruce  03/26/2021     @PREFPERIOPPHARMACY @   Your procedure is scheduled on  03/31/2021.   Report to [facility] at  0800  A.M.   Call this number if you have problems the morning of surgery:  [phone]   Remember:  Follow the diet and prep instructions given to you by the office.     Your last dose of iron should have been on 03/20/2021.    DO NOT take any medications for diabetes the morning of your procedure.     Take these medicines the morning of surgery with A SIP OF WATER                                        metoprolol.     Do not wear jewelry, make-up or nail polish.  Do not wear lotions, powders, or perfumes, or deodorant.  Do not shave 48 hours prior to surgery.  Men may shave face and neck.  Do not bring valuables to the hospital.  [facility] is not responsible for any belongings or valuables.  Contacts, dentures or bridgework may not be worn into surgery.  Leave your suitcase in the car.  After surgery it may be brought to your room.  For patients admitted to the hospital, discharge time will be determined by your treatment team.  Patients discharged the day of surgery will not be allowed to drive home and must have someone with them for 24 hours.    Special instructions:   DO NOT smoke tobacco or vape for 24 hours before your procedure.  Please read over the following fact sheets that you were given. Anesthesia Post-op Instructions and Care and Recovery After Surgery      Colonoscopy, Adult, Care After This sheet gives you information about how to care for yourself after your procedure. Your health care provider may also give you more specific instructions. If you have problems or questions, contact your health care provider. What can I expect after the procedure? After the procedure, it is common to have: A small amount of blood in your stool for 24 hours after the procedure. Some gas. Mild cramping or bloating of your  abdomen. Follow these instructions at home: Eating and drinking  Drink enough fluid to keep your urine pale yellow. Follow instructions from your health care provider about eating or drinking restrictions. Resume your normal diet as instructed by your health care provider. Avoid heavy or fried foods that are hard to digest. Activity Rest as told by your health care provider. Avoid sitting for a long time without moving. Get up to take short walks every 1-2 hours. This is important to improve blood flow and breathing. Ask for help if you feel weak or unsteady. Return to your normal activities as told by your health care provider. Ask your health care provider what activities are safe for you. Managing cramping and bloating  Try walking around when you have cramps or feel bloated. Apply heat to your abdomen as told by your health care provider. Use the heat source that your health care provider recommends, such as a moist heat pack or a heating pad. Place a towel between your skin and the heat source. Leave the heat on for 20-30 minutes. Remove the heat if your skin turns bright red. This is especially important if you are unable to feel  pain, heat, or cold. You may have a greater risk of getting burned. General instructions If you were given a sedative during the procedure, it can affect you for several hours. Do not drive or operate machinery until your health care provider says that it is safe. For the first 24 hours after the procedure: Do not sign important documents. Do not drink alcohol. Do your regular daily activities at a slower pace than normal. Eat soft foods that are easy to digest. Take over-the-counter and prescription medicines only as told by your health care provider. Keep all follow-up visits as told by your health care provider. This is important. Contact a health care provider if: You have blood in your stool 2-3 days after the procedure. Get help right away if you  have: More than a small spotting of blood in your stool. Large blood clots in your stool. Swelling of your abdomen. Nausea or vomiting. A fever. Increasing pain in your abdomen that is not relieved with medicine. Summary After the procedure, it is common to have a small amount of blood in your stool. You may also have mild cramping and bloating of your abdomen. If you were given a sedative during the procedure, it can affect you for several hours. Do not drive or operate machinery until your health care provider says that it is safe. Get help right away if you have a lot of blood in your stool, nausea or vomiting, a fever, or increased pain in your abdomen. This information is not intended to replace advice given to you by your health care provider. Make sure you discuss any questions you have with your health care provider. Document Revised: 11/25/2018 Document Reviewed: 08/15/2018 Elsevier Patient Education  [place] After This sheet gives you information about how to care for yourself after your procedure. Your health care provider may also give you more specific instructions. If you have problems or questions, contact your health care provider. What can I expect after the procedure? After the procedure, it is common to have: Tiredness. Forgetfulness about what happened after the procedure. Impaired judgment for important decisions. Nausea or vomiting. Some difficulty with balance. Follow these instructions at home: For the time period you were told by your health care provider:   Rest as needed. Do not participate in activities where you could fall or become injured. Do not drive or use machinery. Do not drink alcohol. Do not take sleeping pills or medicines that cause drowsiness. Do not make important decisions or sign legal documents. Do not take care of children on your own. Eating and drinking Follow the diet that is recommended by  your health care provider. Drink enough fluid to keep your urine pale yellow. If you vomit: Drink water, juice, or soup when you can drink without vomiting. Make sure you have little or no nausea before eating solid foods. General instructions Have a responsible adult stay with you for the time you are told. It is important to have someone help care for you until you are awake and alert. Take over-the-counter and prescription medicines only as told by your health care provider. If you have sleep apnea, surgery and certain medicines can increase your risk for breathing problems. Follow instructions from your health care provider about wearing your sleep device: Anytime you are sleeping, including during daytime naps. While taking prescription pain medicines, sleeping medicines, or medicines that make you drowsy. Avoid smoking. Keep all follow-up visits as told by your health  care provider. This is important. Contact a health care provider if: You keep feeling nauseous or you keep vomiting. You feel light-headed. You are still sleepy or having trouble with balance after 24 hours. You develop a rash. You have a fever. You have redness or swelling around the IV site. Get help right away if: You have trouble breathing. You have new-onset confusion at home. Summary For several hours after your procedure, you may feel tired. You may also be forgetful and have poor judgment. Have a responsible adult stay with you for the time you are told. It is important to have someone help care for you until you are awake and alert. Rest as told. Do not drive or operate machinery. Do not drink alcohol or take sleeping pills. Get help right away if you have trouble breathing, or if you suddenly become confused. This information is not intended to replace advice given to you by your health care provider. Make sure you discuss any questions you have with your health care provider. Document Revised: 10/05/2019  Document Reviewed: 12/22/2018 Elsevier Patient Education  2022 [employer].

## 2021-03-27 ENCOUNTER — Encounter (HOSPITAL_COMMUNITY): Payer: Self-pay

## 2021-03-27 ENCOUNTER — Encounter (HOSPITAL_COMMUNITY)
Admission: RE | Admit: 2021-03-27 | Discharge: 2021-03-27 | Disposition: A | Discharge status: Home / Self Care | Payer: Medicare Other | Source: Ambulatory Visit | Attending: Internal Medicine | Admitting: Internal Medicine

## 2021-03-27 VITALS — BP 127/76 | HR 102 | Temp 97.8°F | Resp 18 | Ht 72.0 in | Wt 137.0 lb

## 2021-03-27 DIAGNOSIS — E1165 Type 2 diabetes mellitus with hyperglycemia: Secondary | ICD-10-CM | POA: Insufficient documentation

## 2021-03-27 DIAGNOSIS — D649 Anemia, unspecified: Secondary | ICD-10-CM | POA: Diagnosis not present

## 2021-03-27 DIAGNOSIS — Z01812 Encounter for preprocedural laboratory examination: Secondary | ICD-10-CM | POA: Insufficient documentation

## 2021-03-27 LAB — BASIC METABOLIC PANEL
Anion gap: 6 (ref 5–15)
BUN: 40 mg/dL — ABNORMAL HIGH (ref 8–23)
CO2: 19 mmol/L — ABNORMAL LOW (ref 22–32)
Calcium: 9 mg/dL (ref 8.9–10.3)
Chloride: 111 mmol/L (ref 98–111)
Creatinine, Ser: 1.81 mg/dL — ABNORMAL HIGH (ref 0.61–1.24)
GFR, Estimated: 41 mL/min — ABNORMAL LOW (ref >60)
Glucose, Bld: 143 mg/dL — ABNORMAL HIGH (ref 70–99)
Potassium: 4.5 mmol/L (ref 3.5–5.1)
Sodium: 136 mmol/L (ref 135–145)

## 2021-03-27 LAB — CBC WITH DIFFERENTIAL/PLATELET
Abs Immature Granulocytes: 0.02 10*3/uL (ref 0.00–0.07)
Basophils Absolute: 0.1 10*3/uL (ref 0.0–0.1)
Basophils Relative: 1 %
Eosinophils Absolute: 0.1 10*3/uL (ref 0.0–0.5)
Eosinophils Relative: 2 %
HCT: 33.7 % — ABNORMAL LOW (ref 39.0–52.0)
Hemoglobin: 10.7 g/dL — ABNORMAL LOW (ref 13.0–17.0)
Immature Granulocytes: 0 %
Lymphocytes Relative: 26 %
Lymphs Abs: 1.3 10*3/uL (ref 0.7–4.0)
MCH: 31 pg (ref 26.0–34.0)
MCHC: 31.8 g/dL (ref 30.0–36.0)
MCV: 97.7 fL (ref 80.0–100.0)
Monocytes Absolute: 0.4 10*3/uL (ref 0.1–1.0)
Monocytes Relative: 8 %
Neutro Abs: 3.1 10*3/uL (ref 1.7–7.7)
Neutrophils Relative %: 63 %
Platelets: 188 10*3/uL (ref 150–400)
RBC: 3.45 MIL/uL — ABNORMAL LOW (ref 4.22–5.81)
RDW: 12.6 % (ref 11.5–15.5)
WBC: 4.9 10*3/uL (ref 4.0–10.5)
nRBC: 0 % (ref 0.0–0.2)

## 2021-03-31 ENCOUNTER — Ambulatory Visit (HOSPITAL_COMMUNITY): Payer: Medicare Other | Admitting: Anesthesiology

## 2021-03-31 ENCOUNTER — Ambulatory Visit (HOSPITAL_COMMUNITY)
Admission: RE | Admit: 2021-03-31 | Discharge: 2021-03-31 | Disposition: A | Discharge status: Home / Self Care | Payer: Medicare Other | Source: Ambulatory Visit | Attending: Internal Medicine | Admitting: Internal Medicine

## 2021-03-31 ENCOUNTER — Ambulatory Visit (HOSPITAL_BASED_OUTPATIENT_CLINIC_OR_DEPARTMENT_OTHER): Payer: Medicare Other | Admitting: Anesthesiology

## 2021-03-31 ENCOUNTER — Encounter (HOSPITAL_COMMUNITY): Payer: Self-pay

## 2021-03-31 ENCOUNTER — Other Ambulatory Visit: Payer: Self-pay

## 2021-03-31 ENCOUNTER — Encounter (HOSPITAL_COMMUNITY)
Admission: RE | Disposition: A | Discharge status: Home / Self Care | Payer: Self-pay | Source: Ambulatory Visit | Attending: Internal Medicine

## 2021-03-31 DIAGNOSIS — Z1211 Encounter for screening for malignant neoplasm of colon: Secondary | ICD-10-CM | POA: Diagnosis not present

## 2021-03-31 DIAGNOSIS — I10 Essential (primary) hypertension: Secondary | ICD-10-CM | POA: Insufficient documentation

## 2021-03-31 DIAGNOSIS — Z9049 Acquired absence of other specified parts of digestive tract: Secondary | ICD-10-CM | POA: Insufficient documentation

## 2021-03-31 DIAGNOSIS — D128 Benign neoplasm of rectum: Secondary | ICD-10-CM | POA: Insufficient documentation

## 2021-03-31 DIAGNOSIS — Z980 Intestinal bypass and anastomosis status: Secondary | ICD-10-CM | POA: Insufficient documentation

## 2021-03-31 DIAGNOSIS — K648 Other hemorrhoids: Secondary | ICD-10-CM | POA: Insufficient documentation

## 2021-03-31 DIAGNOSIS — Z85038 Personal history of other malignant neoplasm of large intestine: Secondary | ICD-10-CM | POA: Diagnosis not present

## 2021-03-31 DIAGNOSIS — Z87891 Personal history of nicotine dependence: Secondary | ICD-10-CM | POA: Insufficient documentation

## 2021-03-31 DIAGNOSIS — K621 Rectal polyp: Secondary | ICD-10-CM | POA: Diagnosis not present

## 2021-03-31 DIAGNOSIS — Z86010 Personal history of colonic polyps: Secondary | ICD-10-CM

## 2021-03-31 DIAGNOSIS — Z7984 Long term (current) use of oral hypoglycemic drugs: Secondary | ICD-10-CM | POA: Insufficient documentation

## 2021-03-31 DIAGNOSIS — E119 Type 2 diabetes mellitus without complications: Secondary | ICD-10-CM | POA: Insufficient documentation

## 2021-03-31 DIAGNOSIS — Z8673 Personal history of transient ischemic attack (TIA), and cerebral infarction without residual deficits: Secondary | ICD-10-CM | POA: Insufficient documentation

## 2021-03-31 DIAGNOSIS — Z800 Family history of malignant neoplasm of digestive organs: Secondary | ICD-10-CM | POA: Diagnosis not present

## 2021-03-31 DIAGNOSIS — Z139 Encounter for screening, unspecified: Secondary | ICD-10-CM | POA: Diagnosis not present

## 2021-03-31 HISTORY — PX: COLONOSCOPY WITH PROPOFOL: SHX5780

## 2021-03-31 HISTORY — PX: POLYPECTOMY: SHX5525

## 2021-03-31 LAB — GLUCOSE, CAPILLARY: Glucose-Capillary: 85 mg/dL (ref 70–99)

## 2021-03-31 SURGERY — COLONOSCOPY WITH PROPOFOL
Anesthesia: General

## 2021-03-31 MED ORDER — LIDOCAINE 2% (20 MG/ML) 5 ML SYRINGE
INTRAMUSCULAR | Status: DC | PRN
Start: 2021-03-31 — End: 2021-03-31
  Administered 2021-03-31: 50 mg via INTRAVENOUS

## 2021-03-31 MED ORDER — PROPOFOL 500 MG/50ML IV EMUL
INTRAVENOUS | Status: DC | PRN
  Administered 2021-03-31: 200 ug/kg/min via INTRAVENOUS

## 2021-03-31 MED ORDER — LACTATED RINGERS IV SOLN: INTRAVENOUS | Status: DC

## 2021-03-31 MED ORDER — PROPOFOL 10 MG/ML IV BOLUS
INTRAVENOUS | Status: DC | PRN
  Administered 2021-03-31: 40 mg via INTRAVENOUS
  Administered 2021-03-31: 60 mg via INTRAVENOUS

## 2021-03-31 NOTE — Discharge Instructions (Addendum)
°  Colonoscopy Discharge Instructions  Read the instructions outlined below and refer to this sheet in the next few weeks. These discharge instructions provide you with general information on caring for yourself after you leave the hospital. Your doctor may also give you specific instructions. While your treatment has been planned according to the most current medical practices available, unavoidable complications occasionally occur.   ACTIVITY You may resume your regular activity, but move at a slower pace for the next 24 hours.  Take frequent rest periods for the next 24 hours.  Walking will help get rid of the air and reduce the bloated feeling in your belly (abdomen).  No driving for 24 hours (because of the medicine (anesthesia) used during the test).   Do not sign any important legal documents or operate any machinery for 24 hours (because of the anesthesia used during the test).  NUTRITION Drink plenty of fluids.  You may resume your normal diet as instructed by your doctor.  Begin with a light meal and progress to your normal diet. Heavy or fried foods are harder to digest and may make you feel sick to your stomach (nauseated).  Avoid alcoholic beverages for 24 hours or as instructed.  MEDICATIONS You may resume your normal medications unless your doctor tells you otherwise.  WHAT YOU CAN EXPECT TODAY Some feelings of bloating in the abdomen.  Passage of more gas than usual.  Spotting of blood in your stool or on the toilet paper.  IF YOU HAD POLYPS REMOVED DURING THE COLONOSCOPY: No aspirin products for 7 days or as instructed.  No alcohol for 7 days or as instructed.  Eat a soft diet for the next 24 hours.  FINDING OUT THE RESULTS OF YOUR TEST Not all test results are available during your visit. If your test results are not back during the visit, make an appointment with your caregiver to find out the results. Do not assume everything is normal if you have not heard from your  caregiver or the medical facility. It is important for you to follow up on all of your test results.  SEEK IMMEDIATE MEDICAL ATTENTION IF: You have more than a spotting of blood in your stool.  Your belly is swollen (abdominal distention).  You are nauseated or vomiting.  You have a temperature over 101.  You have abdominal pain or discomfort that is severe or gets worse throughout the day.   Your colonoscopy revealed 1 polyp(s) which I removed successfully. Await pathology results, my office will contact you. I recommend repeating colonoscopy in 5 years for surveillance purposes.  This did bleed after polypectomy so I placed 1 metallic clip.  The bleeding stopped.  You might have a little bit of bleeding, please let us know if you have a significant amount.  This clip should fall off by itself over the next 4 to 6 weeks.  If you need MRI for any reason in the near future you will need x-ray done prior.   I hope you have a great rest of your week!  Elon Alas. Abbey Chatters, D.O. Gastroenterology and Hepatology [facility]

## 2021-03-31 NOTE — Anesthesia Postprocedure Evaluation (Signed)
Anesthesia Post Note  Patient: Alec Bruce  Procedure(s) Performed: COLONOSCOPY WITH PROPOFOL POLYPECTOMY  Patient location during evaluation: Phase II Anesthesia Type: General Level of consciousness: awake and alert and oriented Pain management: pain level controlled Vital Signs Assessment: post-procedure vital signs reviewed and stable Respiratory status: spontaneous breathing, nonlabored ventilation and respiratory function stable Cardiovascular status: blood pressure returned to baseline and stable Postop Assessment: no apparent nausea or vomiting Anesthetic complications: no   No notable events documented.   Last Vitals:  Vitals:   03/31/21 0900 03/31/21 1007  BP: (!) 154/82 (!) 97/58  Pulse: (!) 110   Resp: 15 17  Temp: 36.6 C 36.7 C  SpO2: 100% 100%    Last Pain:  Vitals:   03/31/21 1007  TempSrc: Oral  PainSc: 0-No pain                 Byan Poplaski C Miranda Garber

## 2021-03-31 NOTE — Anesthesia Preprocedure Evaluation (Signed)
Anesthesia Evaluation  Patient identified by MRN, date of birth, ID band Patient awake    Reviewed: Allergy & Precautions, NPO status , Patient's Chart, lab work & pertinent test results, reviewed documented beta blocker date and time   Airway Mallampati: II  TM Distance: >3 FB Neck ROM: Full    Dental  (+) Dental Advisory Given, Missing, Poor Dentition   Pulmonary former smoker,    Pulmonary exam normal breath sounds clear to auscultation       Cardiovascular Exercise Tolerance: Good hypertension, Pt. on medications and Pt. on home beta blockers  Rhythm:Regular Rate:Tachycardia     Neuro/Psych TIACVA, Residual Symptoms negative psych ROS   GI/Hepatic negative GI ROS, Neg liver ROS,   Endo/Other  diabetes, Well Controlled, Type 2, Oral Hypoglycemic Agents  Renal/GU Renal disease     Musculoskeletal negative musculoskeletal ROS (+)   Abdominal   Peds  Hematology negative hematology ROS (+)   Anesthesia Other Findings   Reproductive/Obstetrics negative OB ROS                            Anesthesia Physical Anesthesia Plan  ASA: 3  Anesthesia Plan: General   Post-op Pain Management: Minimal or no pain anticipated   Induction: Intravenous  PONV Risk Score and Plan: TIVA  Airway Management Planned: Nasal Cannula and Natural Airway  Additional Equipment:   Intra-op Plan:   Post-operative Plan:   Informed Consent: I have reviewed the patients History and Physical, chart, labs and discussed the procedure including the risks, benefits and alternatives for the proposed anesthesia with the patient or authorized representative who has indicated his/her understanding and acceptance.     Dental advisory given  Plan Discussed with: CRNA and Surgeon  Anesthesia Plan Comments:         Anesthesia Quick Evaluation

## 2021-03-31 NOTE — Transfer of Care (Signed)
Immediate Anesthesia Transfer of Care Note  Patient: Alec Bruce  Procedure(s) Performed: COLONOSCOPY WITH PROPOFOL POLYPECTOMY  Patient Location: Short Stay  Anesthesia Type:MAC  Level of Consciousness: awake, alert , oriented and patient cooperative  Airway & Oxygen Therapy: Patient Spontanous Breathing  Post-op Assessment: Report given to RN, Post -op Vital signs reviewed and stable and Patient moving all extremities  Post vital signs: Reviewed and stable  Last Vitals:  Vitals Value Taken Time  BP    Temp    Pulse    Resp    SpO2      Last Pain:  Vitals:   03/31/21 0949  TempSrc:   PainSc: 0-No pain         Complications: No notable events documented.

## 2021-03-31 NOTE — Op Note (Addendum)
[facility] Patient Name: Alec Bruce Procedure Date: 03/31/2021 9:40 AM MRN: [id number] Date of Birth: 25-Jan-1956 Attending MD: Elon Alas. Abbey Chatters DO CSN: [id number] Age: 66 Admit Type: Outpatient Procedure:                Colonoscopy Indications:              High risk colon cancer surveillance: Personal                            history of colon cancer Providers:                Elon Alas. Abbey Chatters, DO, Jessica Boudreaux, Janeece Riggers, RN, Suzan Garibaldi. Risa Grill, Technician Referring MD:              Medicines:                See the Anesthesia note for documentation of the                            administered medications Complications:            No immediate complications. Estimated Blood Loss:     Estimated blood loss was minimal. Procedure:                Pre-Anesthesia Assessment:                           - The anesthesia plan was to use monitored                            anesthesia care (MAC).                           After obtaining informed consent, the colonoscope                            was passed under direct vision. Throughout the                            procedure, the patient's blood pressure, pulse, and                            oxygen saturations were monitored continuously. The                            [id number]-HQ190L ([id number]) scope was introduced through                            the anus and advanced to the the cecum, identified                            by appendiceal orifice and ileocecal valve. The                            colonoscopy was performed  without difficulty. The                            patient tolerated the procedure well. The quality                            of the bowel preparation was evaluated using the                            BBPS [facility] Bowel Preparation Scale) with scores                            of: Right Colon = 3, Transverse Colon = 3 and Left                            Colon = 3  (entire mucosa seen well with no residual                            staining, small fragments of stool or opaque                            liquid). The total BBPS score equals 9. Scope In: 9:53:56 AM Scope Out: 10:03:21 AM Scope Withdrawal Time: 0 hours 8 minutes 13 seconds  Total Procedure Duration: 0 hours 9 minutes 25 seconds  Findings:      The perianal and digital rectal examinations were normal.      Non-bleeding internal hemorrhoids were found during endoscopy.      There was evidence of a prior end-to-end colo-colonic anastomosis in the       sigmoid colon. This was patent and was characterized by healthy       appearing mucosa. The anastomosis was traversed.      A 5 mm polyp was found in the rectum. The polyp was sessile. The polyp       was removed with a cold snare. Resection and retrieval were complete.       For hemostasis, one hemostatic clip was successfully placed (MR       conditional). There was no bleeding at the end of the procedure. Impression:               - Non-bleeding internal hemorrhoids.                           - Patent end-to-end colo-colonic anastomosis,                            characterized by healthy appearing mucosa.                           - One 5 mm polyp in the rectum, removed with a cold                            snare. Resected and retrieved. Clip (MR                            conditional) was placed.  Moderate Sedation:      Per Anesthesia Care Recommendation:           - Patient has a contact number available for                            emergencies. The signs and symptoms of potential                            delayed complications were discussed with the                            patient. Return to normal activities tomorrow.                            Written discharge instructions were provided to the                            patient.                           - Resume previous diet.                           - Continue  present medications.                           - Await pathology results.                           - Repeat colonoscopy in 5 years for surveillance.                           - Return to GI clinic PRN.                           - Needs KUB prior to MRI in the near future. Procedure Code(s):        --- Professional ---                           [phone], Colonoscopy, flexible; with removal of                            tumor(s), polyp(s), or other lesion(s) by snare                            technique Diagnosis Code(s):        --- Professional ---                           S97.026, Personal history of other malignant                            neoplasm of large intestine                           Z98.0, Intestinal bypass and anastomosis status  K62.1, Rectal polyp                           K64.8, Other hemorrhoids CPT copyright 2019 American Medical Association. All rights reserved. The codes documented in this report are preliminary and upon coder review may  be revised to meet current compliance requirements. Elon Alas. Abbey Chatters, DO [place] Abbey Chatters, DO 03/31/2021 10:07:35 AM This report has been signed electronically. Number of Addenda: 0

## 2021-03-31 NOTE — H&P (Signed)
Primary Care Physician:  Celene Squibb, MD Primary Gastroenterologist:  Dr. Abbey Chatters  Pre-Procedure History & Physical: HPI:  Alec Bruce is a 66 y.o. male is here  for a colonoscopy to be performed for surveillance purposes. Personal hx of colon cancer early 90's, pt says he had part of colon removed by Dr. Velora Heckler, family hx of colon cancer (mother and cousin)  Past Medical History:  Diagnosis Date   Cancer [facility])    colon cancer 90s   Diabetes mellitus without complication ([place])    Hypertension    Stroke ([place])    TIA    Past Surgical History:  Procedure Laterality Date   APPENDECTOMY      Prior to Admission medications   Medication Sig Start Date End Date Taking? Authorizing Provider  aspirin EC 81 MG tablet Take 81 mg by mouth in the morning. Swallow whole.   Yes [provider]  atorvastatin (LIPITOR) 40 MG tablet Take 1 tablet (40 mg total) by mouth every evening. For stroke prevention 08/03/20  Yes Roxan Hockey, MD  Cholecalciferol ([employer] VIT D 5000 HIGH-POTENCY PO) Take 5,000 Units by mouth in the morning. 08/30/19  Yes [provider]  DULoxetine (CYMBALTA) 60 MG capsule Take 60 mg by mouth at bedtime.   Yes [provider]  ferrous sulfate 325 (65 FE) MG tablet Take 325 mg by mouth in the morning and at bedtime. MORNING & NOON   Yes [provider]  metoprolol succinate (TOPROL XL) 25 MG 24 hr tablet Take 1 tablet (25 mg total) by mouth daily. Patient taking differently: Take 12.5 mg by mouth in the morning. 08/03/20 08/03/21 Yes Emokpae, Courage, MD  polyethylene glycol (MIRALAX / GLYCOLAX) 17 g packet Take 17 g by mouth daily. Patient taking differently: Take 17 g by mouth daily as needed (constipation.). 08/03/20  Yes Emokpae, Courage, MD  senna-docusate (SENOKOT-S) 8.6-50 MG tablet Take 2 tablets by mouth at bedtime. Patient taking differently: Take 2 tablets by mouth daily as needed (constipation.). 08/03/20  Yes Emokpae, Courage, MD   sitaGLIPtin (JANUVIA) 100 MG tablet Take 1 tablet (100 mg total) by mouth daily. Patient taking differently: Take 100 mg by mouth daily after lunch. 04/01/19  Yes Emokpae, Courage, MD  aspirin 325 MG tablet Take 1 tablet (325 mg total) by mouth daily with breakfast. For stroke prevention 08/03/20   Roxan Hockey, MD    Allergies as of 03/05/2021   (No Known Allergies)    History reviewed. No pertinent family history.  Social History   Socioeconomic History   Marital status: Single    Spouse name: Not on file   Number of children: Not on file   Years of education: Not on file   Highest education level: Not on file  Occupational History   Not on file  Tobacco Use   Smoking status: Former   Smokeless tobacco: Never  Vaping Use   Vaping Use: Never used  Substance and Sexual Activity   Alcohol use: Yes    Comment: occ   Drug use: Not Currently   Sexual activity: Not on file  Other Topics Concern   Not on file  Social History Narrative   Not on file   Social Determinants of Health   Financial Resource Strain: Not on file  Food Insecurity: Not on file  Transportation Needs: Not on file  Physical Activity: Not on file  Stress: Not on file  Social Connections: Not on file  Intimate Partner Violence: Not on file  Review of Systems: See HPI, otherwise negative ROS  Physical Exam: Vital signs in last 24 hours: Temp:  [97.9 F (36.6 C)] 97.9 F (36.6 C) (02/27 0900) Pulse Rate:  [110] 110 (02/27 0900) Resp:  [15] 15 (02/27 0900) BP: (154)/(82) 154/82 (02/27 0900) SpO2:  [100 %] 100 % (02/27 0900)   General:   Alert,  Well-developed, well-nourished, pleasant and cooperative in NAD Head:  Normocephalic and atraumatic. Eyes:  Sclera clear, no icterus.   Conjunctiva pink. Ears:  Normal auditory acuity. Nose:  No deformity, discharge,  or lesions. Mouth:  No deformity or lesions, dentition normal. Neck:  Supple; no masses or thyromegaly. Lungs:  Clear throughout  to auscultation.   No wheezes, crackles, or rhonchi. No acute distress. Heart:  Regular rate and rhythm; no murmurs, clicks, rubs,  or gallops. Abdomen:  Soft, nontender and nondistended. No masses, hepatosplenomegaly or hernias noted. Normal bowel sounds, without guarding, and without rebound.   Msk:  Symmetrical without gross deformities. Normal posture. Extremities:  Without clubbing or edema. Neurologic:  Alert and  oriented x4;  grossly normal neurologically. Skin:  Intact without significant lesions or rashes. Cervical Nodes:  No significant cervical adenopathy. Psych:  Alert and cooperative. Normal mood and affect.  Impression/Plan: Alec Bruce is here for a colonoscopy to be performed for surveillance purposes. Personal hx of colon cancer early 90's, pt says he had part of colon removed by Dr. Velora Heckler, family hx of colon cancer (mother and cousin)  The risks of the procedure including infection, bleed, or perforation as well as benefits, limitations, alternatives and imponderables have been reviewed with the patient. Questions have been answered. All parties agreeable.

## 2021-04-01 DIAGNOSIS — E782 Mixed hyperlipidemia: Secondary | ICD-10-CM | POA: Diagnosis not present

## 2021-04-01 DIAGNOSIS — I10 Essential (primary) hypertension: Secondary | ICD-10-CM | POA: Diagnosis not present

## 2021-04-02 LAB — SURGICAL PATHOLOGY

## 2021-04-03 ENCOUNTER — Encounter (HOSPITAL_COMMUNITY): Payer: Self-pay | Admitting: Internal Medicine

## 2021-04-04 DIAGNOSIS — R6889 Other general symptoms and signs: Secondary | ICD-10-CM | POA: Diagnosis not present

## 2021-04-04 DIAGNOSIS — E113512 Type 2 diabetes mellitus with proliferative diabetic retinopathy with macular edema, left eye: Secondary | ICD-10-CM | POA: Diagnosis not present

## 2021-04-10 DIAGNOSIS — E079 Disorder of thyroid, unspecified: Secondary | ICD-10-CM | POA: Diagnosis not present

## 2021-04-10 DIAGNOSIS — E785 Hyperlipidemia, unspecified: Secondary | ICD-10-CM | POA: Diagnosis not present

## 2021-04-10 DIAGNOSIS — E119 Type 2 diabetes mellitus without complications: Secondary | ICD-10-CM | POA: Diagnosis not present

## 2021-04-15 DIAGNOSIS — E119 Type 2 diabetes mellitus without complications: Secondary | ICD-10-CM | POA: Diagnosis not present

## 2021-04-15 DIAGNOSIS — I10 Essential (primary) hypertension: Secondary | ICD-10-CM | POA: Diagnosis not present

## 2021-04-15 DIAGNOSIS — E785 Hyperlipidemia, unspecified: Secondary | ICD-10-CM | POA: Diagnosis not present

## 2021-04-15 DIAGNOSIS — N189 Chronic kidney disease, unspecified: Secondary | ICD-10-CM | POA: Diagnosis not present

## 2021-04-15 DIAGNOSIS — R945 Abnormal results of liver function studies: Secondary | ICD-10-CM | POA: Diagnosis not present

## 2021-04-15 DIAGNOSIS — E079 Disorder of thyroid, unspecified: Secondary | ICD-10-CM | POA: Diagnosis not present

## 2021-04-15 DIAGNOSIS — E782 Mixed hyperlipidemia: Secondary | ICD-10-CM | POA: Diagnosis not present

## 2021-04-15 DIAGNOSIS — K769 Liver disease, unspecified: Secondary | ICD-10-CM | POA: Diagnosis not present

## 2021-04-15 DIAGNOSIS — F32A Depression, unspecified: Secondary | ICD-10-CM | POA: Diagnosis not present

## 2021-05-02 DIAGNOSIS — E785 Hyperlipidemia, unspecified: Secondary | ICD-10-CM | POA: Diagnosis not present

## 2021-05-02 DIAGNOSIS — I10 Essential (primary) hypertension: Secondary | ICD-10-CM | POA: Diagnosis not present

## 2021-05-16 DIAGNOSIS — E113512 Type 2 diabetes mellitus with proliferative diabetic retinopathy with macular edema, left eye: Secondary | ICD-10-CM | POA: Diagnosis not present

## 2021-05-23 DIAGNOSIS — E113512 Type 2 diabetes mellitus with proliferative diabetic retinopathy with macular edema, left eye: Secondary | ICD-10-CM | POA: Diagnosis not present

## 2021-05-23 DIAGNOSIS — H2513 Age-related nuclear cataract, bilateral: Secondary | ICD-10-CM | POA: Diagnosis not present

## 2021-05-23 DIAGNOSIS — H468 Other optic neuritis: Secondary | ICD-10-CM | POA: Diagnosis not present

## 2021-05-23 DIAGNOSIS — H40003 Preglaucoma, unspecified, bilateral: Secondary | ICD-10-CM | POA: Diagnosis not present

## 2021-05-23 DIAGNOSIS — R6889 Other general symptoms and signs: Secondary | ICD-10-CM | POA: Diagnosis not present

## 2021-06-01 DIAGNOSIS — N189 Chronic kidney disease, unspecified: Secondary | ICD-10-CM | POA: Diagnosis not present

## 2021-06-01 DIAGNOSIS — E119 Type 2 diabetes mellitus without complications: Secondary | ICD-10-CM | POA: Diagnosis not present

## 2021-06-01 DIAGNOSIS — I129 Hypertensive chronic kidney disease with stage 1 through stage 4 chronic kidney disease, or unspecified chronic kidney disease: Secondary | ICD-10-CM | POA: Diagnosis not present

## 2021-06-01 DIAGNOSIS — E785 Hyperlipidemia, unspecified: Secondary | ICD-10-CM | POA: Diagnosis not present

## 2021-06-18 DIAGNOSIS — E113512 Type 2 diabetes mellitus with proliferative diabetic retinopathy with macular edema, left eye: Secondary | ICD-10-CM | POA: Diagnosis not present

## 2021-06-18 DIAGNOSIS — Z0100 Encounter for examination of eyes and vision without abnormal findings: Secondary | ICD-10-CM | POA: Diagnosis not present

## 2021-07-30 DIAGNOSIS — Z0100 Encounter for examination of eyes and vision without abnormal findings: Secondary | ICD-10-CM | POA: Diagnosis not present

## 2021-07-30 DIAGNOSIS — H468 Other optic neuritis: Secondary | ICD-10-CM | POA: Diagnosis not present

## 2021-07-30 DIAGNOSIS — H40003 Preglaucoma, unspecified, bilateral: Secondary | ICD-10-CM | POA: Diagnosis not present

## 2021-07-30 DIAGNOSIS — H2513 Age-related nuclear cataract, bilateral: Secondary | ICD-10-CM | POA: Diagnosis not present

## 2021-08-01 DIAGNOSIS — Z0100 Encounter for examination of eyes and vision without abnormal findings: Secondary | ICD-10-CM | POA: Diagnosis not present

## 2021-08-01 DIAGNOSIS — E113512 Type 2 diabetes mellitus with proliferative diabetic retinopathy with macular edema, left eye: Secondary | ICD-10-CM | POA: Diagnosis not present

## 2021-08-12 DIAGNOSIS — E079 Disorder of thyroid, unspecified: Secondary | ICD-10-CM | POA: Diagnosis not present

## 2021-08-12 DIAGNOSIS — E119 Type 2 diabetes mellitus without complications: Secondary | ICD-10-CM | POA: Diagnosis not present

## 2021-08-12 DIAGNOSIS — E785 Hyperlipidemia, unspecified: Secondary | ICD-10-CM | POA: Diagnosis not present

## 2021-08-14 DIAGNOSIS — Z0000 Encounter for general adult medical examination without abnormal findings: Secondary | ICD-10-CM | POA: Diagnosis not present

## 2021-08-14 DIAGNOSIS — E785 Hyperlipidemia, unspecified: Secondary | ICD-10-CM | POA: Diagnosis not present

## 2021-08-14 DIAGNOSIS — R945 Abnormal results of liver function studies: Secondary | ICD-10-CM | POA: Diagnosis not present

## 2021-08-14 DIAGNOSIS — E119 Type 2 diabetes mellitus without complications: Secondary | ICD-10-CM | POA: Diagnosis not present

## 2021-08-14 DIAGNOSIS — N189 Chronic kidney disease, unspecified: Secondary | ICD-10-CM | POA: Diagnosis not present

## 2021-08-14 DIAGNOSIS — D649 Anemia, unspecified: Secondary | ICD-10-CM | POA: Diagnosis not present

## 2021-08-14 DIAGNOSIS — I10 Essential (primary) hypertension: Secondary | ICD-10-CM | POA: Diagnosis not present

## 2021-08-14 DIAGNOSIS — E079 Disorder of thyroid, unspecified: Secondary | ICD-10-CM | POA: Diagnosis not present

## 2021-08-14 DIAGNOSIS — K769 Liver disease, unspecified: Secondary | ICD-10-CM | POA: Diagnosis not present

## 2021-08-14 DIAGNOSIS — F32A Depression, unspecified: Secondary | ICD-10-CM | POA: Diagnosis not present

## 2021-08-14 DIAGNOSIS — Z681 Body mass index (BMI) 19 or less, adult: Secondary | ICD-10-CM | POA: Diagnosis not present

## 2021-09-04 ENCOUNTER — Ambulatory Visit (INDEPENDENT_AMBULATORY_CARE_PROVIDER_SITE_OTHER): Payer: Medicare Other | Admitting: Urology

## 2021-09-04 ENCOUNTER — Encounter: Payer: Self-pay | Admitting: Urology

## 2021-09-04 VITALS — BP 115/69 | HR 101

## 2021-09-04 DIAGNOSIS — N401 Enlarged prostate with lower urinary tract symptoms: Secondary | ICD-10-CM

## 2021-09-04 DIAGNOSIS — R3912 Poor urinary stream: Secondary | ICD-10-CM

## 2021-09-04 DIAGNOSIS — N1832 Chronic kidney disease, stage 3b: Secondary | ICD-10-CM

## 2021-09-04 DIAGNOSIS — R3129 Other microscopic hematuria: Secondary | ICD-10-CM

## 2021-09-04 DIAGNOSIS — N138 Other obstructive and reflux uropathy: Secondary | ICD-10-CM | POA: Diagnosis not present

## 2021-09-04 DIAGNOSIS — N500 Atrophy of testis: Secondary | ICD-10-CM | POA: Diagnosis not present

## 2021-09-04 DIAGNOSIS — N200 Calculus of kidney: Secondary | ICD-10-CM | POA: Diagnosis not present

## 2021-09-04 DIAGNOSIS — R972 Elevated prostate specific antigen [PSA]: Secondary | ICD-10-CM | POA: Diagnosis not present

## 2021-09-04 LAB — URINALYSIS, ROUTINE W REFLEX MICROSCOPIC
Bilirubin, UA: NEGATIVE
Glucose, UA: NEGATIVE
Ketones, UA: NEGATIVE
Leukocytes,UA: NEGATIVE
Nitrite, UA: NEGATIVE
Specific Gravity, UA: 1.02 (ref 1.005–1.030)
Urobilinogen, Ur: 0.2 mg/dL (ref 0.2–1.0)
pH, UA: 5 (ref 5.0–7.5)

## 2021-09-04 LAB — MICROSCOPIC EXAMINATION
Epithelial Cells (non renal): NONE SEEN /hpf (ref 0–10)
Renal Epithel, UA: NONE SEEN /hpf
WBC, UA: NONE SEEN /hpf (ref 0–5)

## 2021-09-04 MED ORDER — LEVOFLOXACIN 750 MG PO TABS: ORAL_TABLET | ORAL | 0 refills | Status: DC

## 2021-09-04 MED ORDER — DIAZEPAM 10 MG PO TABS: ORAL_TABLET | ORAL | 0 refills | Status: DC

## 2021-09-04 NOTE — Patient Instructions (Addendum)
   Appointment Time:8:15 am  Appointment Date: October 09, 2021  Location: [facility] Radiology Department   Prostate Biopsy Instructions  Stop all aspirin or blood thinners (aspirin, plavix, coumadin, warfarin, motrin, ibuprofen, advil, aleve, naproxen, naprosyn) for 7 days prior to the procedure.  If you have any questions about stopping these medications, please contact your primary care physician or cardiologist.  Having a light meal prior to the procedure is recommended.  If you are diabetic or have low blood sugar please bring a small snack or glucose tablet.  A Fleets enema is needed to be purchased over the counter at a local pharmacy and used 2 hours before you scheduled appointment.  This can be purchased over the counter at any pharmacy.  Antibiotics will be administered in the clinic at the time of the procedure and 1 tablet has been sent to your pharmacy. Please take the antibiotic as prescribed.    Please bring someone with you to the procedure to drive you home if you are given a valium to take prior to your procedure.   If you have any questions or concerns, please feel free to call the office at [phone] or send a Mychart message.    Thank you, [facility]

## 2021-09-04 NOTE — Progress Notes (Signed)
Subjective: 1. Elevated PSA   2. BPH with urinary obstruction   3. Weak urinary stream   4. Testicular atrophy   5. Microhematuria   6. Renal stones   7. Stage 3b chronic kidney disease [facility])      Consult requested by Dr. Wende Neighbors.  Alec Bruce is a 66 yo male who is sent for a recent PSA elevation of 10. He has not had a rectal exam.  He has pass a couple of kidney stones but has no other GU history. He has a reduced stream with some intermittency and post void dribbling.  He has had no hematuria or dysuria. He has had no other recent acute ailments.  He has ED.   He has DM with neuropathy and CKD3b as well as a history of colon cancer with a partial colectomy in the 1990s.  He has 11-30 RBC's on UA today.  He had a CT stone study in 6/22 that showed non-obstructing renal stones.   ROS:  Review of Systems  Neurological:  Positive for tingling and sensory change.  All other systems reviewed and are negative.   No Known Allergies  Past Medical History:  Diagnosis Date   Anemia    Cancer ([place])    colon cancer 90s   Diabetes mellitus without complication ([place])    Hypertension    Stroke [facility])    TIA    Past Surgical History:  Procedure Laterality Date   APPENDECTOMY     COLONOSCOPY WITH PROPOFOL N/A 03/31/2021   Procedure: COLONOSCOPY WITH PROPOFOL;  Surgeon: Eloise Harman, DO;  Location: [facility] ENDO SUITE;  Service: Endoscopy;  Laterality: N/A;  10:00 / ASA 3   PARTIAL COLECTOMY  1990   POLYPECTOMY  03/31/2021   Procedure: POLYPECTOMY;  Surgeon: Eloise Harman, DO;  Location: [facility] ENDO SUITE;  Service: Endoscopy;;    Social History   Socioeconomic History   Marital status: Single    Spouse name: Not on file   Number of children: Not on file   Years of education: Not on file   Highest education level: Not on file  Occupational History   Not on file  Tobacco Use   Smoking status: Former   Smokeless tobacco: Never  Vaping Use   Vaping Use: Never used  Substance  and Sexual Activity   Alcohol use: Yes    Comment: occ   Drug use: Not Currently   Sexual activity: Not on file  Other Topics Concern   Not on file  Social History Narrative   Not on file   Social Determinants of Health   Financial Resource Strain: Not on file  Food Insecurity: Not on file  Transportation Needs: Not on file  Physical Activity: Not on file  Stress: Not on file  Social Connections: Not on file  Intimate Partner Violence: Not on file    History reviewed. No pertinent family history.  Anti-infectives: Anti-infectives (From admission, onward)    Start     Dose/Rate Route Frequency Ordered Stop   09/04/21 0000  levofloxacin (LEVAQUIN) 750 MG tablet           09/04/21 0907         Current Outpatient Medications  Medication Sig Dispense Refill   aspirin EC 81 MG tablet Take 81 mg by mouth in the morning. Swallow whole.     atorvastatin (LIPITOR) 40 MG tablet Take 1 tablet (40 mg total) by mouth every evening. For stroke prevention 30 tablet 11  Cholecalciferol ([employer]S VIT D 5000 HIGH-POTENCY PO) Take 5,000 Units by mouth in the morning.     diazepam (VALIUM) 10 MG tablet Take 1 tablet by mouth 1 hour prior to procedure. 1 tablet 0   DULoxetine (CYMBALTA) 60 MG capsule Take 60 mg by mouth at bedtime.     ferrous sulfate 325 (65 FE) MG tablet Take 325 mg by mouth in the morning and at bedtime. MORNING & NOON     levofloxacin (LEVAQUIN) 750 MG tablet Take 1 tablet by mouth 1 hour prior to procedure. 1 tablet 0   senna-docusate (SENOKOT-S) 8.6-50 MG tablet Take 2 tablets by mouth at bedtime. (Patient taking differently: Take 2 tablets by mouth daily as needed (constipation.).) 60 tablet 3   sitaGLIPtin (JANUVIA) 100 MG tablet Take 1 tablet (100 mg total) by mouth daily. (Patient taking differently: Take 100 mg by mouth daily after lunch.) 30 tablet 11   aspirin 325 MG tablet Take 1 tablet (325 mg total) by mouth daily with breakfast. For stroke prevention 30 tablet 11    metoprolol succinate (TOPROL XL) 25 MG 24 hr tablet Take 1 tablet (25 mg total) by mouth daily. (Patient taking differently: Take 12.5 mg by mouth in the morning.) 30 tablet 11   polyethylene glycol (MIRALAX / GLYCOLAX) 17 g packet Take 17 g by mouth daily. (Patient taking differently: Take 17 g by mouth daily as needed (constipation.).) 30 each 3   No current facility-administered medications for this visit.     Objective: Vital signs in last 24 hours: BP 115/69   Pulse (!) 101   Intake/Output from previous day: No intake/output data recorded. Intake/Output this shift: '@IOTHISSHIFT' @   Physical Exam Vitals reviewed.  Constitutional:      Appearance: Normal appearance.  Cardiovascular:     Rate and Rhythm: Normal rate and regular rhythm.     Heart sounds: Normal heart sounds.  Pulmonary:     Effort: Pulmonary effort is normal.     Breath sounds: Normal breath sounds.  Abdominal:     General: Abdomen is flat.     Palpations: Abdomen is soft.     Comments: Lower midline scar  Genitourinary:    Comments: Normal phallus with adequate meatus. Scrotum normal. Testes with mild bilateral atrophy.   Epididymis normal. AP without lesions. NST with mild stenosis.  Prostate small and benign. SV non-palpable.  Musculoskeletal:        General: Tenderness present. No swelling. Normal range of motion.  Lymphadenopathy:     Cervical: No cervical adenopathy.     Upper Body:     Right upper body: No supraclavicular adenopathy.     Left upper body: No supraclavicular adenopathy.     Lower Body: No right inguinal adenopathy. No left inguinal adenopathy.  Skin:    General: Skin is warm and dry.  Neurological:     General: No focal deficit present.     Mental Status: He is alert and oriented to person, place, and time.  Psychiatric:        Mood and Affect: Mood normal.        Behavior: Behavior normal.     Lab Results:  Results for orders placed or performed in visit on  09/04/21 (from the past 24 hour(s))  Urinalysis, Routine w reflex microscopic     Status: Abnormal   Collection Time: 09/04/21  9:44 AM  Result Value Ref Range   Specific Gravity, UA 1.020 1.005 - 1.030   pH, UA 5.0  5.0 - 7.5   Color, UA Yellow Yellow   Appearance Ur Clear Clear   Leukocytes,UA Negative Negative   Protein,UA 1+ (A) Negative/Trace   Glucose, UA Negative Negative   Ketones, UA Negative Negative   RBC, UA 2+ (A) Negative   Bilirubin, UA Negative Negative   Urobilinogen, Ur 0.2 0.2 - 1.0 mg/dL   Nitrite, UA Negative Negative   Microscopic Examination See below:    Narrative   Performed at:  [facility] [address] [place], [place]  [id number] Lab Director: Mina Marble MT, Phone:  [phone]  Microscopic Examination     Status: Abnormal   Collection Time: 09/04/21  9:44 AM   Urine  Result Value Ref Range   WBC, UA None seen 0 - 5 /hpf   RBC, Urine 11-30 (A) 0 - 2 /hpf   Epithelial Cells (non renal) None seen 0 - 10 /hpf   Renal Epithel, UA None seen None seen /hpf   Mucus, UA Present Not Estab.   Bacteria, UA Few None seen/Few   Narrative   Performed at:  [facility] [address], [place], [place]  [id number] Lab Director: Mina Marble MT, Phone:  [phone]  PSA, total and free     Status: Abnormal   Collection Time: 09/04/21 10:23 AM  Result Value Ref Range   Prostate Specific Ag, Serum 15.3 (H) 0.0 - 4.0 ng/mL   PSA, Free 3.43 N/A ng/mL   PSA, Free Pct 22.4 %   Narrative   Performed at:  [address], [place], [place]  [id number] Lab Director: Rush Farmer MD, Phone:  [phone]  Testosterone Free, Profile I     Status: None   Collection Time: 09/04/21 10:23 AM  Result Value Ref Range   Albumin 4.3 3.9 - 4.9 g/dL   Testosterone 429 264 - 916 ng/dL   Sex Hormone Binding 50.7 19.3 - 76.4 nmol/L   Testost., Free, Calc 66.4 34.7 - 150.3 pg/mL   Narrative   Performed at:  [address] [place], [place]  [id number] Lab Director: Rush Farmer MD, Phone:  [phone]  Basic metabolic panel     Status: Abnormal   Collection Time: 09/04/21 10:23 AM  Result Value Ref Range   Glucose 119 (H) 70 - 99 mg/dL   BUN 45 (H) 8 - 27 mg/dL   Creatinine, Ser 1.96 (H) 0.76 - 1.27 mg/dL   eGFR 37 (L) >59 mL/min/1.73   BUN/Creatinine Ratio 23 10 - 24   Sodium 137 134 - 144 mmol/L   Potassium 4.7 3.5 - 5.2 mmol/L   Chloride 106 96 - 106 mmol/L   CO2 16 (L) 20 - 29 mmol/L   Calcium 9.3 8.6 - 10.2 mg/dL   Narrative   Performed at:  [facility] [address], [place], [place]  [id number] Lab Director: Rush Farmer MD, Phone:  [phone]    BMET Recent Labs    09/04/21 1023  NA 137  K 4.7  CL 106  CO2 16*  GLUCOSE 119*  BUN 45*  CREATININE 1.96*  CALCIUM 9.3   PT/INR No results for input(s): "LABPROT", "INR" in the last 72 hours. ABG No results for input(s): "PHART", "HCO3" in the last 72 hours.  Invalid input(s): "PCO2", "PO2" PSA reviewed. Studies/Results: No results found. CT stone study from 6/22 reviewed.  CLINICAL DATA:  Abdominal pain and constipation.   EXAM: CT ABDOMEN AND PELVIS WITHOUT  CONTRAST   TECHNIQUE: Multidetector CT imaging of the abdomen and pelvis was performed following the standard protocol without IV contrast.   COMPARISON:  None.   FINDINGS: Lower chest: No acute abnormality.   Hepatobiliary: No focal liver abnormality is seen. Gallbladder sludge and a subcentimeter gallstone are seen within the gallbladder lumen, without evidence of gallbladder wall thickening or biliary dilatation. A 3 mm calcification is seen adjacent to the superomedial aspect of the gallbladder (axial CT image 18, CT series 2).   Pancreas: Unremarkable. No pancreatic ductal dilatation or surrounding inflammatory changes.   Spleen: Normal in size without focal abnormality.   Adrenals/Urinary Tract: Adrenal glands are  unremarkable. Kidneys are normal in size. 2 mm nonobstructing renal stones are seen within the right kidney. A 6 mm nonobstructing renal stone is seen within the left kidney. A 2.2 cm x 1.9 cm cyst is noted within the lateral aspect of the mid to lower left kidney. Bladder is unremarkable.   Stomach/Bowel: Stomach is within normal limits. The appendix is not identified. A mild amount of stool is seen within the sigmoid colon. No evidence of bowel wall thickening, distention, or inflammatory changes.   Vascular/Lymphatic: Aortic atherosclerosis. No enlarged abdominal or pelvic lymph nodes.   Reproductive: Prostate is unremarkable.   Other: No abdominal wall hernia or abnormality. No abdominopelvic ascites.   Musculoskeletal: No acute or significant osseous findings.   IMPRESSION: 1. Cholelithiasis and gallbladder sludge with an additional small calcification adjacent to the gallbladder. A small gallstone within the common bile duct cannot be excluded. Correlation with right upper quadrant ultrasound is recommended. 2. Bilateral subcentimeter nonobstructing renal calculi. 3. Minimal stool burden within the distal sigmoid colon.     Electronically Signed   By: Virgina Norfolk M.D.   On: 08/01/2020 15:46   Assessment/Plan: Elevated PSA.   I will repeat today and if the elevation is confirmed, he will need a prostate [place] and biopsy.  I reviewed the risks of bleeding, infection and voiding difficulty and sent scripts for levofloxin and valium.   Microhematuria.  He has a history of stones but needs a CT hematuria study if his renal function will allow.  If his Cr is too high, he will need to have cystoscopy with retrograde pyelography and could have the prostate biopsy in the OR. Testicular atrophy.  I will get a testosterone level today. CKD3b on labs from 2/23.   I will repeat today.  He has an appointment with nephrology later this month.   BPH with BOO.  His prostate is small  and benign and he has only mild LUTS.   Meds ordered this encounter  Medications   levofloxacin (LEVAQUIN) 750 MG tablet    Sig: Take 1 tablet by mouth 1 hour prior to procedure.    Dispense:  1 tablet    Refill:  0   diazepam (VALIUM) 10 MG tablet    Sig: Take 1 tablet by mouth 1 hour prior to procedure.    Dispense:  1 tablet    Refill:  0     Orders Placed This Encounter  Procedures   Microscopic Examination   CT HEMATURIA WORKUP    Standing Status:   Future    Standing Expiration Date:   03/07/2022    Order Specific Question:   Reason for Exam (SYMPTOM  OR DIAGNOSIS REQUIRED)    Answer:   microhematuria    Order Specific Question:   Preferred imaging location?    Answer:  [facility]    Order Specific Question:   Radiology Contrast Protocol - do NOT remove file path    Answer:   [email or web address]   [place] PROSTATE BIOPSY MULTIPLE    Standing Status:   Future    Standing Expiration Date:   03/07/2022    Order Specific Question:   Reason for Exam (SYMPTOM  OR DIAGNOSIS REQUIRED)    Answer:   elevated psa    Order Specific Question:   Preferred location?    Answer:   [facility]   US Guided Needle Placement    Order Specific Question:   Reason for Exam (SYMPTOM  OR DIAGNOSIS REQUIRED)    Answer:   elevated psa    Order Specific Question:   Preferred imaging location?    Answer:   [facility]   [place] Transrectal Complete    Order Specific Question:   Reason for Exam (SYMPTOM  OR DIAGNOSIS REQUIRED)    Answer:   elevated psa    Order Specific Question:   Preferred imaging location?    Answer:   [facility]   Urinalysis, Routine w reflex microscopic   PSA, total and free   Testosterone Free, Profile I   Basic metabolic panel     Return for He will be scheduled for a prostate [place] and biopsy, but may need to go to OR for cystoscopy and RTGs..    CC: Dr. Celene Squibb.      Irine Seal 09/05/2021 [phone]

## 2021-09-05 ENCOUNTER — Telehealth: Payer: Self-pay

## 2021-09-05 LAB — PSA, TOTAL AND FREE
PSA, Free Pct: 22.4 %
PSA, Free: 3.43 ng/mL
Prostate Specific Ag, Serum: 15.3 ng/mL — ABNORMAL HIGH (ref 0.0–4.0)

## 2021-09-05 LAB — BASIC METABOLIC PANEL
BUN/Creatinine Ratio: 23 (ref 10–24)
BUN: 45 mg/dL — ABNORMAL HIGH (ref 8–27)
CO2: 16 mmol/L — ABNORMAL LOW (ref 20–29)
Calcium: 9.3 mg/dL (ref 8.6–10.2)
Chloride: 106 mmol/L (ref 96–106)
Creatinine, Ser: 1.96 mg/dL — ABNORMAL HIGH (ref 0.76–1.27)
Glucose: 119 mg/dL — ABNORMAL HIGH (ref 70–99)
Potassium: 4.7 mmol/L (ref 3.5–5.2)
Sodium: 137 mmol/L (ref 134–144)
eGFR: 37 mL/min/{1.73_m2} — ABNORMAL LOW (ref >59)

## 2021-09-05 LAB — TESTOSTERONE FREE, PROFILE I
Albumin: 4.3 g/dL (ref 3.9–4.9)
Sex Hormone Binding: 50.7 nmol/L (ref 19.3–76.4)
Testost., Free, Calc: 66.4 pg/mL (ref 34.7–150.3)
Testosterone: 429 ng/dL (ref 264–916)

## 2021-09-05 NOTE — Telephone Encounter (Signed)
-----   Message from Irine Seal, MD sent at 09/05/2021 10:53 AM EDT ----- His PSA is up further to 15.3.  His Cr is 1.96 so he will not be able to get a CT with contrast.   The CT needs to be changed to a stone study and he will need to have a cystoscopy with bilateral retrograde pyelograms in the OR unless the CT shows a clear cause of the hematuria.   He could have the prostate [place] and biopsy in the OR as well if the cysto and retrogrades prove to be needed.  Please change the CT order to CT stone study and hold off of scheduling the biopsy until we see that result.    Thanks.  ----- Message ----- From: Darcella Gasman R, CMA Sent: 09/05/2021   8:09 AM EDT To: Irine Seal, MD  Please Review

## 2021-09-05 NOTE — Telephone Encounter (Signed)
Spoke with patient and informed him that his prostate biopsy was cancel and a CT order was place for a CT stone study and [facility] will contact patient with time and date. Patient made aware and voice understanding.

## 2021-09-05 NOTE — Telephone Encounter (Signed)
CT hematuria cancelled and CT stone study placed. Prostate Biopsy cancelled.

## 2021-09-05 NOTE — Telephone Encounter (Signed)
Patient called without no answer and left voice message to call office back. Will try back at a later time.

## 2021-09-05 NOTE — Addendum Note (Signed)
Addended by: Iris Pert on: 09/05/2021 12:46 PM   Modules accepted: Orders

## 2021-09-10 DIAGNOSIS — Z0100 Encounter for examination of eyes and vision without abnormal findings: Secondary | ICD-10-CM | POA: Diagnosis not present

## 2021-09-10 DIAGNOSIS — E113512 Type 2 diabetes mellitus with proliferative diabetic retinopathy with macular edema, left eye: Secondary | ICD-10-CM | POA: Diagnosis not present

## 2021-09-18 ENCOUNTER — Ambulatory Visit (HOSPITAL_COMMUNITY)
Admission: RE | Admit: 2021-09-18 | Discharge: 2021-09-18 | Disposition: A | Discharge status: Home / Self Care | Payer: Medicare Other | Source: Ambulatory Visit | Attending: Urology | Admitting: Urology

## 2021-09-18 DIAGNOSIS — R3129 Other microscopic hematuria: Secondary | ICD-10-CM | POA: Insufficient documentation

## 2021-09-18 DIAGNOSIS — K8020 Calculus of gallbladder without cholecystitis without obstruction: Secondary | ICD-10-CM | POA: Diagnosis not present

## 2021-09-18 DIAGNOSIS — N202 Calculus of kidney with calculus of ureter: Secondary | ICD-10-CM | POA: Diagnosis not present

## 2021-09-18 DIAGNOSIS — N281 Cyst of kidney, acquired: Secondary | ICD-10-CM | POA: Diagnosis not present

## 2021-09-18 DIAGNOSIS — I700 Atherosclerosis of aorta: Secondary | ICD-10-CM | POA: Diagnosis not present

## 2021-09-22 ENCOUNTER — Telehealth: Payer: Self-pay

## 2021-09-22 NOTE — Telephone Encounter (Signed)
Patient returned call.  He was unable to make the 08/24 apt due to transportation.  He is scheduled for f/u with [facility] on 10/02/2021.  Waiting to see if [facility] or [facility] have OR openings in the next 2-3 weeks to scheduled biopsy.

## 2021-09-22 NOTE — Telephone Encounter (Signed)
Call patient to schedule follow for ureteral stone and was unable to reach patient. Left VM for patient to call the office back. Potential app 08/24 @ 11:30 if patient confirmed.

## 2021-09-22 NOTE — Telephone Encounter (Signed)
-----   Message from Irine Seal, MD sent at 09/22/2021 12:08 PM EDT ----- He has a ureteral stone that will need treatment.   Please have him see me this Thursday if possible so we can discuss management options.   He was to be scheduled for a prostate biopsy but that can be done in the OR too.  Please see if Dr. Alyson Ingles or Dr. Felipa Eth have time in the OR in the next 2-3 weeks to do ureteroscopy and a prostate biopsy.   If not, I can try to schedule him in [place].   ----- Message ----- From: Sherrilyn Rist, CMA Sent: 09/19/2021   1:06 PM EDT To: Irine Seal, MD  Please review

## 2021-09-24 ENCOUNTER — Telehealth: Payer: Self-pay

## 2021-09-24 NOTE — Telephone Encounter (Signed)
Made patient aware that he has a ureteral stone that need treatment. Schedule patient for appt with Dr. Jeffie Pollock and patient voiced understanding. Made patient aware that his prostate biopsy will be schedule in the next 2-3 weeks with Dr. Alyson Ingles Dr. Felipa Eth and the surgery scheduler will follow up with him in the next few weeks.

## 2021-09-24 NOTE — Telephone Encounter (Signed)
-----   Message from Irine Seal, MD sent at 09/22/2021 12:08 PM EDT ----- He has a ureteral stone that will need treatment.   Please have him see me this Thursday if possible so we can discuss management options.   He was to be scheduled for a prostate biopsy but that can be done in the OR too.  Please see if Dr. Alyson Ingles or Dr. Felipa Eth have time in the OR in the next 2-3 weeks to do ureteroscopy and a prostate biopsy.   If not, I can try to schedule him in [place].   ----- Message ----- From: Sherrilyn Rist, CMA Sent: 09/19/2021   1:06 PM EDT To: Irine Seal, MD  Please review

## 2021-09-25 ENCOUNTER — Other Ambulatory Visit: Payer: Medicare Other | Admitting: Urology

## 2021-09-25 ENCOUNTER — Ambulatory Visit: Payer: Medicare Other | Admitting: Urology

## 2021-09-25 DIAGNOSIS — D638 Anemia in other chronic diseases classified elsewhere: Secondary | ICD-10-CM | POA: Diagnosis not present

## 2021-09-25 DIAGNOSIS — I129 Hypertensive chronic kidney disease with stage 1 through stage 4 chronic kidney disease, or unspecified chronic kidney disease: Secondary | ICD-10-CM | POA: Diagnosis not present

## 2021-09-25 DIAGNOSIS — N189 Chronic kidney disease, unspecified: Secondary | ICD-10-CM | POA: Diagnosis not present

## 2021-09-25 DIAGNOSIS — R809 Proteinuria, unspecified: Secondary | ICD-10-CM | POA: Diagnosis not present

## 2021-09-25 DIAGNOSIS — N200 Calculus of kidney: Secondary | ICD-10-CM | POA: Diagnosis not present

## 2021-09-25 DIAGNOSIS — E8721 Acute metabolic acidosis: Secondary | ICD-10-CM | POA: Diagnosis not present

## 2021-09-25 DIAGNOSIS — E1122 Type 2 diabetes mellitus with diabetic chronic kidney disease: Secondary | ICD-10-CM | POA: Diagnosis not present

## 2021-09-25 DIAGNOSIS — E1129 Type 2 diabetes mellitus with other diabetic kidney complication: Secondary | ICD-10-CM | POA: Diagnosis not present

## 2021-10-02 ENCOUNTER — Encounter: Payer: Self-pay | Admitting: Urology

## 2021-10-02 ENCOUNTER — Ambulatory Visit: Payer: Medicare Other | Admitting: Urology

## 2021-10-02 VITALS — BP 108/70 | HR 105 | Ht 72.0 in | Wt 144.0 lb

## 2021-10-02 DIAGNOSIS — N1832 Chronic kidney disease, stage 3b: Secondary | ICD-10-CM | POA: Diagnosis not present

## 2021-10-02 DIAGNOSIS — R972 Elevated prostate specific antigen [PSA]: Secondary | ICD-10-CM

## 2021-10-02 DIAGNOSIS — N200 Calculus of kidney: Secondary | ICD-10-CM

## 2021-10-02 DIAGNOSIS — N401 Enlarged prostate with lower urinary tract symptoms: Secondary | ICD-10-CM

## 2021-10-02 DIAGNOSIS — E1122 Type 2 diabetes mellitus with diabetic chronic kidney disease: Secondary | ICD-10-CM | POA: Diagnosis not present

## 2021-10-02 DIAGNOSIS — N138 Other obstructive and reflux uropathy: Secondary | ICD-10-CM

## 2021-10-02 DIAGNOSIS — R3129 Other microscopic hematuria: Secondary | ICD-10-CM

## 2021-10-02 DIAGNOSIS — R3912 Poor urinary stream: Secondary | ICD-10-CM | POA: Diagnosis not present

## 2021-10-02 DIAGNOSIS — N189 Chronic kidney disease, unspecified: Secondary | ICD-10-CM | POA: Diagnosis not present

## 2021-10-02 DIAGNOSIS — R809 Proteinuria, unspecified: Secondary | ICD-10-CM | POA: Diagnosis not present

## 2021-10-02 DIAGNOSIS — D638 Anemia in other chronic diseases classified elsewhere: Secondary | ICD-10-CM | POA: Diagnosis not present

## 2021-10-02 DIAGNOSIS — E1129 Type 2 diabetes mellitus with other diabetic kidney complication: Secondary | ICD-10-CM | POA: Diagnosis not present

## 2021-10-02 NOTE — Progress Notes (Signed)
Subjective: 1. Renal stones   2. Microhematuria   3. BPH with urinary obstruction   4. Weak urinary stream   5. Elevated PSA      Consult requested by Dr. Wende Neighbors.  10/02/21: Alec Bruce returns today in f/u for Alec Bruce history of an elevated PSA and microhematuria with history of stones.  Alec Bruce had a PSA on 8/3 that was up further to 15.3 and had a CT stone study on 09/18/21 that showed a 59m left UVJ stone without obstruction. [place].  Alec Bruce has no flank pain or gross hematuria.  Alec Bruce has no urgency.    Alec Bruce has some increased frequency but has increased Alec Bruce fluid intake.   09/04/21: Alec Bruce a 66yo male who is sent for a recent PSA elevation of 10. Alec Bruce has not had a rectal exam.  Alec Bruce has pass a couple of kidney stones but has no other GU history. Alec Bruce has a reduced stream with some intermittency and post void dribbling.  Alec Bruce has had no hematuria or dysuria. Alec Bruce has had no other recent acute ailments.  Alec Bruce has ED.   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  Alec Bruce has 11-30 RBC's on UA today.  Alec Bruce had a CT stone study in 6/22 that showed non-obstructing renal stones.   ROS:  Review of Systems  Neurological:  Positive for tingling and sensory change.  All other systems reviewed and are negative.   No Known Allergies  Past Medical History:  Diagnosis Date   [place]    [place] 90s   [place]    Hypertension    Stroke ([facility]    TIA    Past Surgical History:  Procedure Laterality Date   APPENDECTOMY     COLONOSCOPY WITH PROPOFOL N/A 03/31/2021   Procedure: COLONOSCOPY WITH PROPOFOL;  Surgeon: CEloise Harman DO;  [facility];  Service: Endoscopy;  Laterality: N/A;  10:00 / ASA 3   PARTIAL COLECTOMY  1990   POLYPECTOMY  03/31/2021   Procedure: POLYPECTOMY;  Surgeon: CEloise Harman DO;  [facility];  Service: Endoscopy;;     Social History   Socioeconomic History   Marital status: Single    Spouse name: Not on file   Number of children: Not on file   Years of education: Not on file   Highest education level: Not on file  Occupational History   Not on file  Tobacco Use   Smoking status: Former   Smokeless tobacco: Never  Vaping Use   Vaping Use: Never used  Substance and Sexual Activity   Alcohol use: Yes    Comment: occ   Drug use: Not Currently   Sexual activity: Not on file  Other Topics Concern   Not on file  Social History Narrative   Not on file   Social Determinants of Health   Financial Resource Strain: Not on file  Food Insecurity: Not on file  Transportation Needs: Not on file  Physical Activity: Not on file  Stress: Not on file  Social Connections: Not on file  Intimate Partner Violence: Not on file    History reviewed. No pertinent family history.  Anti-infectives: Anti-infectives (From admission, onward)    None       Current Outpatient Medications  Medication Sig Dispense Refill   aspirin  EC 81 MG tablet Take 81 mg by mouth in the morning. Swallow whole.     atorvastatin (LIPITOR) 40 MG tablet Take 1 tablet (40 mg total) by mouth every evening. For stroke prevention 30 tablet 11   Cholecalciferol ([employer] VIT D 5000 HIGH-POTENCY PO) Take 5,000 Units by mouth in the morning.     DULoxetine (CYMBALTA) 60 MG capsule Take 60 mg by mouth at bedtime.     ferrous sulfate 325 (65 FE) MG tablet Take 325 mg by mouth in the morning and at bedtime. MORNING & NOON     latanoprost (XALATAN) 0.005 % ophthalmic solution SMARTSIG:1 Drop(s) In Eye(s) Every Evening     senna-docusate (SENOKOT-S) 8.6-50 MG tablet Take 2 tablets by mouth at bedtime. (Patient taking differently: Take 2 tablets by mouth daily as needed (constipation.).) 60 tablet 3   sitaGLIPtin (JANUVIA) 100 MG tablet Take 1 tablet (100 mg total) by mouth daily. (Patient taking differently: Take 100 mg by mouth daily after  lunch.) 30 tablet 11   metoprolol succinate (TOPROL XL) 25 MG 24 hr tablet Take 1 tablet (25 mg total) by mouth daily. (Patient taking differently: Take 12.5 mg by mouth in the morning.) 30 tablet 11   No current facility-administered medications for this visit.     Objective: Vital signs in last 24 hours: BP 108/70   Pulse (!) 105   Ht 6' (1.829 m)   Wt 144 lb (65.3 kg)   BMI 19.53 kg/m   Intake/Output from previous day: No intake/output data recorded. Intake/Output this shift: '@IOTHISSHIFT'$ @   Physical Exam Vitals reviewed.  Constitutional:      Appearance: Normal appearance.  Cardiovascular:     Rate and Rhythm: Normal rate and regular rhythm.     Heart sounds: Normal heart sounds.  Pulmonary:     Effort: Pulmonary effort is normal. No respiratory distress.     Breath sounds: Normal breath sounds.  Neurological:     Mental Status: Alec Bruce is alert.     Lab Results:  No results found for this or any previous visit (from the past 24 hour(s)).   BMET No results for input(s): "NA", "K", "CL", "CO2", "GLUCOSE", "BUN", "CREATININE", "CALCIUM" in the last 72 hours.  PT/INR No results for input(s): "LABPROT", "INR" in the last 72 hours. ABG No results for input(s): "PHART", "HCO3" in the last 72 hours.  Invalid input(s): "PCO2", "PO2" PSA reviewed. Studies/Results: No results found. CT RENAL STONE STUDY  Result Date: 09/19/2021 CLINICAL DATA:  Microhematuria. EXAM: CT ABDOMEN AND PELVIS WITHOUT CONTRAST TECHNIQUE: Multidetector CT imaging of the abdomen and pelvis was performed following the standard protocol without IV contrast. RADIATION DOSE REDUCTION: This exam was performed according to the departmental dose-optimization program which includes automated exposure control, adjustment of the mA and/or kV according to patient size and/or use of iterative reconstruction technique. COMPARISON:  08/01/2020 FINDINGS: Lower chest: Unremarkable. Hepatobiliary: No suspicious  focal abnormality in the liver on this study without intravenous contrast. Small calcified gallstone evident. No intrahepatic or extrahepatic biliary dilation. Pancreas: No focal mass lesion. No dilatation of the main duct. No intraparenchymal cyst. No peripancreatic edema. Spleen: No splenomegaly. No focal mass lesion. Adrenals/Urinary Tract: No adrenal nodule or mass. Punctate stone noted upper pole right kidney on 20/2. 3 mm interpolar right renal stone evident. No right ureteral stone. Small cluster of tiny stones noted lower pole left kidney on 30/2 with a punctate nonobstructing interpolar left stone on 27/2. 2.0 cm cyst noted lower pole left  kidney. Despite the lack of secondary changes in the left kidney and ureter, a 9 x 7 x 7 mm stone is identified in the left UVJ (image 67/2). Stomach/Bowel: Stomach is unremarkable. No gastric wall thickening. No evidence of outlet obstruction. Duodenum is normally positioned as is the ligament of Treitz. No small bowel wall thickening. No small bowel dilatation. Status post partial colectomy with reanastomosis. Vascular/Lymphatic: There is moderate atherosclerotic calcification of the abdominal aorta without aneurysm. There is no gastrohepatic or hepatoduodenal ligament lymphadenopathy. No retroperitoneal or mesenteric lymphadenopathy. No pelvic sidewall lymphadenopathy. Reproductive: The prostate gland and seminal vesicles are unremarkable. Other: No intraperitoneal free fluid. Musculoskeletal: No worrisome lytic or sclerotic osseous abnormality. IMPRESSION: 1. 9 x 7 x 7 mm left UVJ stone without substantial secondary changes in the left kidney or ureter. 2. Bilateral nonobstructing nephrolithiasis. 3. Cholelithiasis. 4. Aortic Atherosclerosis (ICD10-I70.0). Electronically Signed   By: Misty Stanley M.D.   On: 09/19/2021 11:42      Assessment/Plan: Elevated PSA.  Alec Bruce PSA was up further on repeat to 15.3 so Alec Bruce needs a biopsy but with the stone, that can be done in  the OR at the time of ureteroscopy.   I reviewed the risks of the procedures in detail including, bleeding, infection, injury to the urinary tract, need for a stent and secondary procedures, thrombotic events and anesthetic complications.   Microhematuria.  Alec Bruce has a 60m left distal stone and needs ureteroscopy.  Testicular atrophy. Alec Bruce testosterone was normal.  CKD3b on labs from 2/23. Cr was 1.96 on 8/3.  BPH with BOO.  Alec Bruce prostate is small and benign and Alec Bruce has only mild LUTS.   No orders of the defined types were placed in this encounter.    Orders Placed This Encounter  Procedures   Urinalysis, Routine w reflex microscopic     Return for schedule surgery, Next available.    CC: Dr. JCelene Squibb      JIrine Seal9/02/2021 [phone]

## 2021-10-02 NOTE — H&P (View-Only) (Signed)
Subjective: 1. Renal stones   2. Microhematuria   3. BPH with urinary obstruction   4. Weak urinary stream   5. Elevated PSA      Consult requested by Dr. Wende Neighbors.  10/02/21: Alec Bruce returns today in f/u for his history of an elevated PSA and microhematuria with history of stones.  He had a PSA on 8/3 that was up further to 15.3 and had a CT stone study on 09/18/21 that showed a 70m left UVJ stone without obstruction. [place].  He has no flank pain or gross hematuria.  He has no urgency.    He has some increased frequency but has increased his fluid intake.   09/04/21: [name] a 66yo male who is sent for a recent PSA elevation of 10. He has not had a rectal exam.  He has pass a couple of kidney stones but has no other GU history. He has a reduced stream with some intermittency and post void dribbling.  He has had no hematuria or dysuria. He has had no other recent acute ailments.  He has ED.   He has DM with neuropathy and CKD3b as well as a history of colon cancer with a partial colectomy in the 1990s.  He has 11-30 RBC's on UA today.  He had a CT stone study in 6/22 that showed non-obstructing renal stones.   ROS:  Review of Systems  Neurological:  Positive for tingling and sensory change.  All other systems reviewed and are negative.   No Known Allergies  Past Medical History:  Diagnosis Date   Anemia    [place]    [place] cancer 90s   Diabetes mellitus without complication ([place]    Hypertension    [facility]    TIA    Past Surgical History:  Procedure Laterality Date   APPENDECTOMY     COLONOSCOPY WITH PROPOFOL N/A 03/31/2021   Procedure: COLONOSCOPY WITH PROPOFOL;  Surgeon: CEloise Harman DO;  [facility];  Service: Endoscopy;  Laterality: N/A;  10:00 / ASA 3   PARTIAL COLECTOMY  1990   POLYPECTOMY  03/31/2021   Procedure: POLYPECTOMY;  Surgeon: CEloise Harman DO;  [facility];  Service: Endoscopy;;     Social History   Socioeconomic History   Marital status: Single    Spouse name: Not on file   Number of children: Not on file   Years of education: Not on file   Highest education level: Not on file  Occupational History   Not on file  Tobacco Use   Smoking status: Former   Smokeless tobacco: Never  Vaping Use   Vaping Use: Never used  Substance and Sexual Activity   Alcohol use: Yes    Comment: occ   Drug use: Not Currently   Sexual activity: Not on file  Other Topics Concern   Not on file  Social History Narrative   Not on file   Social Determinants of Health   Financial Resource Strain: Not on file  Food Insecurity: Not on file  Transportation Needs: Not on file  Physical Activity: Not on file  Stress: Not on file  Social Connections: Not on file  Intimate Partner Violence: Not on file    History reviewed. No pertinent family history.  Anti-infectives: Anti-infectives (From admission, onward)    None       Current Outpatient Medications  Medication Sig Dispense Refill   aspirin  EC 81 MG tablet Take 81 mg by mouth in the morning. Swallow whole.     atorvastatin (LIPITOR) 40 MG tablet Take 1 tablet (40 mg total) by mouth every evening. For stroke prevention 30 tablet 11   Cholecalciferol ([employer] VIT D 5000 HIGH-POTENCY PO) Take 5,000 Units by mouth in the morning.     DULoxetine (CYMBALTA) 60 MG capsule Take 60 mg by mouth at bedtime.     ferrous sulfate 325 (65 FE) MG tablet Take 325 mg by mouth in the morning and at bedtime. MORNING & NOON     latanoprost (XALATAN) 0.005 % ophthalmic solution SMARTSIG:1 Drop(s) In Eye(s) Every Evening     senna-docusate (SENOKOT-S) 8.6-50 MG tablet Take 2 tablets by mouth at bedtime. (Patient taking differently: Take 2 tablets by mouth daily as needed (constipation.).) 60 tablet 3   sitaGLIPtin (JANUVIA) 100 MG tablet Take 1 tablet (100 mg total) by mouth daily. (Patient taking differently: Take 100 mg by mouth daily after  lunch.) 30 tablet 11   metoprolol succinate (TOPROL XL) 25 MG 24 hr tablet Take 1 tablet (25 mg total) by mouth daily. (Patient taking differently: Take 12.5 mg by mouth in the morning.) 30 tablet 11   No current facility-administered medications for this visit.     Objective: Vital signs in last 24 hours: BP 108/70   Pulse (!) 105   Ht 6' (1.829 m)   Wt 144 lb (65.3 kg)   BMI 19.53 kg/m   Intake/Output from previous day: No intake/output data recorded. Intake/Output this shift: '@IOTHISSHIFT'$ @   Physical Exam Vitals reviewed.  Constitutional:      Appearance: Normal appearance.  Cardiovascular:     Rate and Rhythm: Normal rate and regular rhythm.     Heart sounds: Normal heart sounds.  Pulmonary:     Effort: Pulmonary effort is normal. No respiratory distress.     Breath sounds: Normal breath sounds.  Neurological:     Mental Status: He is alert.     Lab Results:  No results found for this or any previous visit (from the past 24 hour(s)).   BMET No results for input(s): "NA", "K", "CL", "CO2", "GLUCOSE", "BUN", "CREATININE", "CALCIUM" in the last 72 hours.  PT/INR No results for input(s): "LABPROT", "INR" in the last 72 hours. ABG No results for input(s): "PHART", "HCO3" in the last 72 hours.  Invalid input(s): "PCO2", "PO2" PSA reviewed. Studies/Results: No results found. CT RENAL STONE STUDY  Result Date: 09/19/2021 CLINICAL DATA:  Microhematuria. EXAM: CT ABDOMEN AND PELVIS WITHOUT CONTRAST TECHNIQUE: Multidetector CT imaging of the abdomen and pelvis was performed following the standard protocol without IV contrast. RADIATION DOSE REDUCTION: This exam was performed according to the departmental dose-optimization program which includes automated exposure control, adjustment of the mA and/or kV according to patient size and/or use of iterative reconstruction technique. COMPARISON:  08/01/2020 FINDINGS: Lower chest: Unremarkable. Hepatobiliary: No suspicious  focal abnormality in the liver on this study without intravenous contrast. Small calcified gallstone evident. No intrahepatic or extrahepatic biliary dilation. Pancreas: No focal mass lesion. No dilatation of the main duct. No intraparenchymal cyst. No peripancreatic edema. Spleen: No splenomegaly. No focal mass lesion. Adrenals/Urinary Tract: No adrenal nodule or mass. Punctate stone noted upper pole right kidney on 20/2. 3 mm interpolar right renal stone evident. No right ureteral stone. Small cluster of tiny stones noted lower pole left kidney on 30/2 with a punctate nonobstructing interpolar left stone on 27/2. 2.0 cm cyst noted lower pole left  kidney. Despite the lack of secondary changes in the left kidney and ureter, a 9 x 7 x 7 mm stone is identified in the left UVJ (image 67/2). Stomach/Bowel: Stomach is unremarkable. No gastric wall thickening. No evidence of outlet obstruction. Duodenum is normally positioned as is the ligament of Treitz. No small bowel wall thickening. No small bowel dilatation. Status post partial colectomy with reanastomosis. Vascular/Lymphatic: There is moderate atherosclerotic calcification of the abdominal aorta without aneurysm. There is no gastrohepatic or hepatoduodenal ligament lymphadenopathy. No retroperitoneal or mesenteric lymphadenopathy. No pelvic sidewall lymphadenopathy. Reproductive: The prostate gland and seminal vesicles are unremarkable. Other: No intraperitoneal free fluid. Musculoskeletal: No worrisome lytic or sclerotic osseous abnormality. IMPRESSION: 1. 9 x 7 x 7 mm left UVJ stone without substantial secondary changes in the left kidney or ureter. 2. Bilateral nonobstructing nephrolithiasis. 3. Cholelithiasis. 4. Aortic Atherosclerosis (ICD10-I70.0). Electronically Signed   By: Misty Stanley M.D.   On: 09/19/2021 11:42      Assessment/Plan: Elevated PSA.  His PSA was up further on repeat to 15.3 so he needs a biopsy but with the stone, that can be done in  the OR at the time of ureteroscopy.   I reviewed the risks of the procedures in detail including, bleeding, infection, injury to the urinary tract, need for a stent and secondary procedures, thrombotic events and anesthetic complications.   Microhematuria.  He has a 35m left distal stone and needs ureteroscopy.  Testicular atrophy. His testosterone was normal.  CKD3b on labs from 2/23. Cr was 1.96 on 8/3.  BPH with BOO.  His prostate is small and benign and he has only mild LUTS.   No orders of the defined types were placed in this encounter.    Orders Placed This Encounter  Procedures   Urinalysis, Routine w reflex microscopic     Return for schedule surgery, Next available.    CC: Dr. JCelene Squibb      JIrine Seal9/02/2021 [phone]

## 2021-10-03 DIAGNOSIS — C61 Malignant neoplasm of prostate: Secondary | ICD-10-CM

## 2021-10-03 HISTORY — DX: Malignant neoplasm of prostate: C61

## 2021-10-03 LAB — URINALYSIS, ROUTINE W REFLEX MICROSCOPIC
Bilirubin, UA: NEGATIVE
Glucose, UA: NEGATIVE
Ketones, UA: NEGATIVE
Leukocytes,UA: NEGATIVE
Nitrite, UA: NEGATIVE
Protein,UA: NEGATIVE
Specific Gravity, UA: 1.01 (ref 1.005–1.030)
Urobilinogen, Ur: 0.2 mg/dL (ref 0.2–1.0)
pH, UA: 5.5 (ref 5.0–7.5)

## 2021-10-03 LAB — MICROSCOPIC EXAMINATION
Bacteria, UA: NONE SEEN
Epithelial Cells (non renal): NONE SEEN /hpf (ref 0–10)
RBC, Urine: >30 /hpf — AB (ref 0–2)
Renal Epithel, UA: NONE SEEN /hpf
WBC, UA: NONE SEEN /hpf (ref 0–5)

## 2021-10-07 DIAGNOSIS — E1129 Type 2 diabetes mellitus with other diabetic kidney complication: Secondary | ICD-10-CM | POA: Diagnosis not present

## 2021-10-07 DIAGNOSIS — N200 Calculus of kidney: Secondary | ICD-10-CM | POA: Diagnosis not present

## 2021-10-07 DIAGNOSIS — E1122 Type 2 diabetes mellitus with diabetic chronic kidney disease: Secondary | ICD-10-CM | POA: Diagnosis not present

## 2021-10-07 DIAGNOSIS — N189 Chronic kidney disease, unspecified: Secondary | ICD-10-CM | POA: Diagnosis not present

## 2021-10-07 DIAGNOSIS — R809 Proteinuria, unspecified: Secondary | ICD-10-CM | POA: Diagnosis not present

## 2021-10-08 DIAGNOSIS — E113512 Type 2 diabetes mellitus with proliferative diabetic retinopathy with macular edema, left eye: Secondary | ICD-10-CM | POA: Diagnosis not present

## 2021-10-08 DIAGNOSIS — Z0100 Encounter for examination of eyes and vision without abnormal findings: Secondary | ICD-10-CM | POA: Diagnosis not present

## 2021-10-08 DIAGNOSIS — H31011 Macula scars of posterior pole (postinflammatory) (post-traumatic), right eye: Secondary | ICD-10-CM | POA: Diagnosis not present

## 2021-10-08 DIAGNOSIS — H40003 Preglaucoma, unspecified, bilateral: Secondary | ICD-10-CM | POA: Diagnosis not present

## 2021-10-09 ENCOUNTER — Other Ambulatory Visit: Payer: Medicare Other | Admitting: Urology

## 2021-10-09 ENCOUNTER — Telehealth: Payer: Self-pay

## 2021-10-09 ENCOUNTER — Ambulatory Visit (HOSPITAL_COMMUNITY): Payer: Medicare Other

## 2021-10-09 DIAGNOSIS — R972 Elevated prostate specific antigen [PSA]: Secondary | ICD-10-CM

## 2021-10-09 NOTE — Telephone Encounter (Signed)
I spoke with Alec Bruce. We have discussed possible surgery dates and 10/22/2021 was agreed upon by all parties. Patient given information about surgery date, what to expect pre-operatively and post operatively.    We discussed that a pre-op nurse will be calling to set up the pre-op visit that will take place prior to surgery. Informed patient that our office will communicate any additional care to be provided after surgery.    Patients questions or concerns were discussed during our call. Advised to call our office should there be any additional information, questions or concerns that arise. Patient verbalized understanding.

## 2021-10-20 NOTE — Patient Instructions (Signed)
Alec Bruce  10/20/2021     '@PREFPERIOPPHARMACY'$ @   Your procedure is scheduled on  10/22/2021.   Report to [facility] at  0900  A.M.   Call this number if you have problems the morning of surgery:  [phone]   Remember:  Do not eat or drink after midnight.          DO NOT take any medications for diabetes the morning of your procedure.     Take these medicines the morning of surgery with A SIP OF WATER                                                     metoprolol.    Do not wear jewelry, make-up or nail polish.  Do not wear lotions, powders, or perfumes, or deodorant.  Do not shave 48 hours prior to surgery.  Men may shave face and neck.  Do not bring valuables to the hospital.  [facility] is not responsible for any belongings or valuables.  Contacts, dentures or bridgework may not be worn into surgery.  Leave your suitcase in the car.  After surgery it may be brought to your room.  For patients admitted to the hospital, discharge time will be determined by your treatment team.  Patients discharged the day of surgery will not be allowed to drive home and must have someone with them for 24 hours.    Special instructions:   DO NOT smoke tobacco or vape for 24 hours before your procedure.   Please read over the following fact sheets that you were given. Coughing and Deep Breathing, Anesthesia Post-op Instructions, and Care and Recovery After Surgery      Transrectal Ultrasound-Guided Prostate Biopsy, Care After What can I expect after the procedure? After the procedure, it is common to have: Pain and discomfort near your butt (rectum), especially while sitting. Pink-colored pee (urine). This is due to small amounts of blood in your pee. A burning feeling while peeing. Blood in your poop (stool). Bleeding from your butt. Blood in your semen. Follow these instructions at home: Medicines Take over-the-counter and prescription medicines only as  told by your doctor. If you were given a sedative during your procedure, do not drive or use machines until your doctor says that it is safe. A sedative is a medicine that helps you relax. If you were prescribed an antibiotic medicine, take it as told by your doctor. Do not stop taking it even if you start to feel better. Activity  Return to your normal activities when your doctor says that it is safe. Ask your doctor when it is okay for you to have sex. You may have to avoid lifting. Ask your doctor how much you can safely lift. General instructions  Drink enough water to keep your pee pale yellow. Watch your pee, poop, and semen for new bleeding or bleeding that gets worse. Keep all follow-up visits. Contact a doctor if: You have any of these: Blood clots in your pee or poop. Blood in your pee more than 2 weeks after the procedure. Blood in your semen more than 2 months after the procedure. New or worse bleeding in your pee, poop, or semen. Very bad belly pain. Your pee smells bad or unusual. You have trouble peeing. Your  lower belly feels firm. You have problems getting an erection. You feel like you may vomit (are nauseous), or you vomit. Get help right away if: You have a fever or chills. You have bright red pee. You have very bad pain that does not get better with medicine. You cannot pee. Summary After this procedure, it is common to have pain and discomfort near your butt, especially while sitting. You may have blood in your pee and poop. It is common to have blood in your semen. Get help right away if you have a fever or chills. This information is not intended to replace advice given to you by your health care provider. Make sure you discuss any questions you have with your health care provider. Document Revised: 07/15/2020 Document Reviewed: 07/15/2020 Elsevier Patient Education  [place]. Ureteral Stent Implantation, Care After The following information  offers guidance on how to care for yourself after your procedure. Your health care provider may also give you more specific instructions. If you have problems or questions, contact your health care provider. What can I expect after the procedure? After the procedure, it is common to have: Nausea. Mild pain when you urinate. You may feel this pain in your lower back or lower abdomen. The pain should stop within a few minutes after you urinate. This pattern may last for up to 1 week. A small amount of blood in your urine for several days. Follow these instructions at home: Medicines Take over-the-counter and prescription medicines only as told by your health care provider. If you were prescribed antibiotics, take them as told by your health care provider. Do not stop using the antibiotic even if you start to feel better. If you were given a sedative during the procedure, it can affect you for several hours. Do not drive or operate machinery until your health care provider says that it is safe. Ask your health care provider if the medicine prescribed to you: Requires you to avoid driving or using machinery. Can cause constipation. You may need to take these actions to prevent or treat constipation: Take over-the-counter or prescription medicines. Eat foods that are high in fiber, such as beans, whole grains, and fresh fruits and vegetables. Limit foods that are high in fat and processed sugars, such as fried or sweet foods. Activity Rest as told by your health care provider. Do not sit for a long time without moving. Get up to take short walks every 1-2 hours. This will improve blood flow and breathing. Ask for help if you feel weak or unsteady. Return to your normal activities as told by your health care provider. Ask your health care provider what activities are safe for you. General instructions  If you have a catheter: Follow instructions from your health care provider about taking care of  your catheter and collection bag. Do not take baths, swim, or use a hot tub until your health care provider approves. Ask your health care provider if you may take showers. You may only be allowed to take sponge baths. Drink enough fluid to keep your urine pale yellow. Do not use any products that contain nicotine or tobacco. These products include cigarettes, chewing tobacco, and vaping devices, such as e-cigarettes. These can delay healing after surgery. If you need help quitting, ask your health care provider. Keep all follow-up visits. Contact a health care provider if: You start passing blood clots, or you have more than a small amount of blood in your urine. You have  pain that gets worse or does not get better with medicine, especially pain when you urinate. You have trouble urinating. You feel nauseous or you vomit again and again during a period of more than 2 days after the procedure. You have a fever. Get help right away if: You are passing blood clots that are 1 inch (2.5 cm) or larger in size. You are leaking urine (have incontinence), or you cannot urinate. The end of the stent comes out of your urethra. You have sudden, sharp, or severe pain in your abdomen or lower back. You have swelling or pain in your legs. You have trouble breathing. These symptoms may be an emergency. Get help right away. Call 911. Do not wait to see if the symptoms will go away. Do not drive yourself to the hospital. Summary After the procedure, it is common to have mild pain when you urinate that goes away within a few minutes after you urinate. This may last for up to 1 week. Take over-the-counter and prescription medicines only as told by your health care provider. Drink enough fluid to keep your urine pale yellow. Call your health care provider if you start passing blood clots, or you have more than a small amount of blood in your urine. This information is not intended to replace advice given to  you by your health care provider. Make sure you discuss any questions you have with your health care provider. Document Revised: 02/24/2021 Document Reviewed: 02/24/2021 Elsevier Patient Education  [place] Anesthesia, Adult, Care After The following information offers guidance on how to care for yourself after your procedure. Your health care provider may also give you more specific instructions. If you have problems or questions, contact your health care provider. What can I expect after the procedure? After the procedure, it is common for people to: Have pain or discomfort at the IV site. Have nausea or vomiting. Have a sore throat or hoarseness. Have trouble concentrating. Feel cold or chills. Feel weak, sleepy, or tired (fatigue). Have soreness and body aches. These can affect parts of the body that were not involved in surgery. Follow these instructions at home: For the time period you were told by your health care provider:  Rest. Do not participate in activities where you could fall or become injured. Do not drive or use machinery. Do not drink alcohol. Do not take sleeping pills or medicines that cause drowsiness. Do not make important decisions or sign legal documents. Do not take care of children on your own. General instructions Drink enough fluid to keep your urine pale yellow. If you have sleep apnea, surgery and certain medicines can increase your risk for breathing problems. Follow instructions from your health care provider about wearing your sleep device: Anytime you are sleeping, including during daytime naps. While taking prescription pain medicines, sleeping medicines, or medicines that make you drowsy. Return to your normal activities as told by your health care provider. Ask your health care provider what activities are safe for you. Take over-the-counter and prescription medicines only as told by your health care provider. Do not use any  products that contain nicotine or tobacco. These products include cigarettes, chewing tobacco, and vaping devices, such as e-cigarettes. These can delay incision healing after surgery. If you need help quitting, ask your health care provider. Contact a health care provider if: You have nausea or vomiting that does not get better with medicine. You vomit every time you eat or drink. You have pain  that does not get better with medicine. You cannot urinate or have bloody urine. You develop a skin rash. You have a fever. Get help right away if: You have trouble breathing. You have chest pain. You vomit blood. These symptoms may be an emergency. Get help right away. Call 911. Do not wait to see if the symptoms will go away. Do not drive yourself to the hospital. Summary After the procedure, it is common to have a sore throat, hoarseness, nausea, vomiting, or to feel weak, sleepy, or fatigue. For the time period you were told by your health care provider, do not drive or use machinery. Get help right away if you have difficulty breathing, have chest pain, or vomit blood. These symptoms may be an emergency. This information is not intended to replace advice given to you by your health care provider. Make sure you discuss any questions you have with your health care provider. Document Revised: 04/18/2021 Document Reviewed: 04/18/2021 Elsevier Patient Education  [place].

## 2021-10-21 ENCOUNTER — Encounter (HOSPITAL_COMMUNITY)
Admission: RE | Admit: 2021-10-21 | Discharge: 2021-10-21 | Disposition: A | Discharge status: Home / Self Care | Payer: Medicare Other | Source: Ambulatory Visit | Attending: Urology | Admitting: Urology

## 2021-10-21 VITALS — BP 112/77 | HR 101 | Temp 97.8°F | Resp 18 | Ht 72.0 in | Wt 144.0 lb

## 2021-10-21 DIAGNOSIS — Z01818 Encounter for other preprocedural examination: Secondary | ICD-10-CM | POA: Insufficient documentation

## 2021-10-21 DIAGNOSIS — E1165 Type 2 diabetes mellitus with hyperglycemia: Secondary | ICD-10-CM

## 2021-10-21 DIAGNOSIS — D649 Anemia, unspecified: Secondary | ICD-10-CM | POA: Diagnosis not present

## 2021-10-21 DIAGNOSIS — I10 Essential (primary) hypertension: Secondary | ICD-10-CM | POA: Diagnosis not present

## 2021-10-21 LAB — CBC WITH DIFFERENTIAL/PLATELET
Abs Immature Granulocytes: 0.01 10*3/uL (ref 0.00–0.07)
Basophils Absolute: 0.1 10*3/uL (ref 0.0–0.1)
Basophils Relative: 1 %
Eosinophils Absolute: 0.1 10*3/uL (ref 0.0–0.5)
Eosinophils Relative: 3 %
HCT: 35.4 % — ABNORMAL LOW (ref 39.0–52.0)
Hemoglobin: 11.3 g/dL — ABNORMAL LOW (ref 13.0–17.0)
Immature Granulocytes: 0 %
Lymphocytes Relative: 29 %
Lymphs Abs: 1.5 10*3/uL (ref 0.7–4.0)
MCH: 30.1 pg (ref 26.0–34.0)
MCHC: 31.9 g/dL (ref 30.0–36.0)
MCV: 94.1 fL (ref 80.0–100.0)
Monocytes Absolute: 0.5 10*3/uL (ref 0.1–1.0)
Monocytes Relative: 10 %
Neutro Abs: 2.9 10*3/uL (ref 1.7–7.7)
Neutrophils Relative %: 57 %
Platelets: 193 10*3/uL (ref 150–400)
RBC: 3.76 MIL/uL — ABNORMAL LOW (ref 4.22–5.81)
RDW: 12.6 % (ref 11.5–15.5)
WBC: 5.2 10*3/uL (ref 4.0–10.5)
nRBC: 0 % (ref 0.0–0.2)

## 2021-10-21 LAB — HEMOGLOBIN A1C
Hgb A1c MFr Bld: 6.5 % — ABNORMAL HIGH (ref 4.8–5.6)
Mean Plasma Glucose: 139.85 mg/dL

## 2021-10-22 ENCOUNTER — Ambulatory Visit (HOSPITAL_COMMUNITY): Payer: Medicare Other

## 2021-10-22 ENCOUNTER — Ambulatory Visit (HOSPITAL_BASED_OUTPATIENT_CLINIC_OR_DEPARTMENT_OTHER): Payer: Medicare Other | Admitting: Anesthesiology

## 2021-10-22 ENCOUNTER — Ambulatory Visit (HOSPITAL_COMMUNITY)
Admission: RE | Admit: 2021-10-22 | Discharge: 2021-10-22 | Disposition: A | Discharge status: Home / Self Care | Payer: Medicare Other | Attending: Urology | Admitting: Urology

## 2021-10-22 ENCOUNTER — Encounter (HOSPITAL_COMMUNITY)
Admission: RE | Disposition: A | Discharge status: Home / Self Care | Payer: Self-pay | Source: Home / Self Care | Attending: Urology

## 2021-10-22 ENCOUNTER — Ambulatory Visit (HOSPITAL_COMMUNITY)
Admission: RE | Admit: 2021-10-22 | Discharge: 2021-10-22 | Disposition: A | Discharge status: Home / Self Care | Payer: Medicare Other | Source: Ambulatory Visit | Attending: Urology | Admitting: Urology

## 2021-10-22 ENCOUNTER — Other Ambulatory Visit: Payer: Self-pay | Admitting: Urology

## 2021-10-22 ENCOUNTER — Ambulatory Visit (HOSPITAL_COMMUNITY): Payer: Medicare Other | Admitting: Anesthesiology

## 2021-10-22 ENCOUNTER — Encounter (HOSPITAL_COMMUNITY): Payer: Self-pay | Admitting: Urology

## 2021-10-22 DIAGNOSIS — N138 Other obstructive and reflux uropathy: Secondary | ICD-10-CM | POA: Diagnosis not present

## 2021-10-22 DIAGNOSIS — N500 Atrophy of testis: Secondary | ICD-10-CM | POA: Diagnosis not present

## 2021-10-22 DIAGNOSIS — Z85038 Personal history of other malignant neoplasm of large intestine: Secondary | ICD-10-CM | POA: Diagnosis not present

## 2021-10-22 DIAGNOSIS — E119 Type 2 diabetes mellitus without complications: Secondary | ICD-10-CM

## 2021-10-22 DIAGNOSIS — R972 Elevated prostate specific antigen [PSA]: Secondary | ICD-10-CM

## 2021-10-22 DIAGNOSIS — Z9049 Acquired absence of other specified parts of digestive tract: Secondary | ICD-10-CM | POA: Diagnosis not present

## 2021-10-22 DIAGNOSIS — Z8673 Personal history of transient ischemic attack (TIA), and cerebral infarction without residual deficits: Secondary | ICD-10-CM | POA: Diagnosis not present

## 2021-10-22 DIAGNOSIS — Z7984 Long term (current) use of oral hypoglycemic drugs: Secondary | ICD-10-CM | POA: Diagnosis not present

## 2021-10-22 DIAGNOSIS — N1832 Chronic kidney disease, stage 3b: Secondary | ICD-10-CM | POA: Diagnosis not present

## 2021-10-22 DIAGNOSIS — D631 Anemia in chronic kidney disease: Secondary | ICD-10-CM | POA: Insufficient documentation

## 2021-10-22 DIAGNOSIS — I129 Hypertensive chronic kidney disease with stage 1 through stage 4 chronic kidney disease, or unspecified chronic kidney disease: Secondary | ICD-10-CM | POA: Diagnosis not present

## 2021-10-22 DIAGNOSIS — E1140 Type 2 diabetes mellitus with diabetic neuropathy, unspecified: Secondary | ICD-10-CM | POA: Insufficient documentation

## 2021-10-22 DIAGNOSIS — I10 Essential (primary) hypertension: Secondary | ICD-10-CM

## 2021-10-22 DIAGNOSIS — Z87891 Personal history of nicotine dependence: Secondary | ICD-10-CM | POA: Insufficient documentation

## 2021-10-22 DIAGNOSIS — R3129 Other microscopic hematuria: Secondary | ICD-10-CM | POA: Diagnosis not present

## 2021-10-22 DIAGNOSIS — N401 Enlarged prostate with lower urinary tract symptoms: Secondary | ICD-10-CM | POA: Insufficient documentation

## 2021-10-22 DIAGNOSIS — C61 Malignant neoplasm of prostate: Secondary | ICD-10-CM | POA: Insufficient documentation

## 2021-10-22 DIAGNOSIS — R3912 Poor urinary stream: Secondary | ICD-10-CM | POA: Diagnosis not present

## 2021-10-22 DIAGNOSIS — E1122 Type 2 diabetes mellitus with diabetic chronic kidney disease: Secondary | ICD-10-CM | POA: Diagnosis not present

## 2021-10-22 DIAGNOSIS — N201 Calculus of ureter: Secondary | ICD-10-CM | POA: Insufficient documentation

## 2021-10-22 DIAGNOSIS — E1165 Type 2 diabetes mellitus with hyperglycemia: Secondary | ICD-10-CM

## 2021-10-22 HISTORY — PX: CYSTOSCOPY WITH RETROGRADE PYELOGRAM, URETEROSCOPY AND STENT PLACEMENT: SHX5789

## 2021-10-22 HISTORY — PX: PROSTATE BIOPSY: SHX241

## 2021-10-22 HISTORY — PX: HOLMIUM LASER APPLICATION: SHX5852

## 2021-10-22 LAB — GLUCOSE, CAPILLARY
Glucose-Capillary: 90 mg/dL (ref 70–99)
Glucose-Capillary: 103 mg/dL — ABNORMAL HIGH (ref 70–99)

## 2021-10-22 SURGERY — CYSTOURETEROSCOPY, WITH RETROGRADE PYELOGRAM AND STENT INSERTION
Anesthesia: General | Site: Ureter

## 2021-10-22 MED ORDER — CHLORHEXIDINE GLUCONATE 0.12 % MT SOLN
15.0000 mL | Freq: Once | OROMUCOSAL | Status: AC
  Administered 2021-10-22: 15 mL via OROMUCOSAL

## 2021-10-22 MED ORDER — FENTANYL CITRATE (PF) 100 MCG/2ML IJ SOLN
INTRAMUSCULAR | Status: AC
  Filled 2021-10-22: qty 2

## 2021-10-22 MED ORDER — LIDOCAINE HCL URETHRAL/MUCOSAL 2 % EX GEL
CUTANEOUS | Status: AC
  Filled 2021-10-22: qty 10

## 2021-10-22 MED ORDER — DIATRIZOATE MEGLUMINE 30 % UR SOLN
URETHRAL | Status: AC
  Filled 2021-10-22: qty 100

## 2021-10-22 MED ORDER — PROPOFOL 10 MG/ML IV BOLUS
INTRAVENOUS | Status: DC | PRN
  Administered 2021-10-22: 140 mg via INTRAVENOUS

## 2021-10-22 MED ORDER — ONDANSETRON HCL 4 MG/2ML IJ SOLN: 4.0000 mg | Freq: Once | INTRAMUSCULAR | Status: DC | PRN

## 2021-10-22 MED ORDER — SODIUM CHLORIDE 0.9 % IV SOLN
2.0000 g | INTRAVENOUS | Status: AC
  Administered 2021-10-22: 2 g via INTRAVENOUS
  Filled 2021-10-22: qty 20

## 2021-10-22 MED ORDER — SODIUM CHLORIDE 0.9 % IV SOLN: INTRAVENOUS | Status: DC | PRN

## 2021-10-22 MED ORDER — WATER FOR IRRIGATION, STERILE IR SOLN
Status: DC | PRN
  Administered 2021-10-22: 500 mL

## 2021-10-22 MED ORDER — LIDOCAINE HCL (CARDIAC) PF 100 MG/5ML IV SOSY
PREFILLED_SYRINGE | INTRAVENOUS | Status: DC | PRN
  Administered 2021-10-22: 50 mg via INTRAVENOUS

## 2021-10-22 MED ORDER — SODIUM CHLORIDE 0.9 % IV SOLN: Freq: Once | INTRAVENOUS | Status: AC

## 2021-10-22 MED ORDER — TAMSULOSIN HCL 0.4 MG PO CAPS: 0.4000 mg | ORAL_CAPSULE | Freq: Every day | ORAL | 0 refills | Status: DC

## 2021-10-22 MED ORDER — MIDAZOLAM HCL 5 MG/5ML IJ SOLN
INTRAMUSCULAR | Status: DC | PRN
  Administered 2021-10-22: 2 mg via INTRAVENOUS

## 2021-10-22 MED ORDER — CEFDINIR 300 MG PO CAPS: 300.0000 mg | ORAL_CAPSULE | Freq: Two times a day (BID) | ORAL | 0 refills | Status: AC

## 2021-10-22 MED ORDER — PHENAZOPYRIDINE HCL 200 MG PO TABS: 200.0000 mg | ORAL_TABLET | Freq: Three times a day (TID) | ORAL | 0 refills | Status: DC | PRN

## 2021-10-22 MED ORDER — FENTANYL CITRATE (PF) 100 MCG/2ML IJ SOLN
INTRAMUSCULAR | Status: DC | PRN
  Administered 2021-10-22: 100 ug via INTRAVENOUS

## 2021-10-22 MED ORDER — ONDANSETRON HCL 4 MG/2ML IJ SOLN
INTRAMUSCULAR | Status: DC | PRN
  Administered 2021-10-22: 4 mg via INTRAVENOUS

## 2021-10-22 MED ORDER — PHENYLEPHRINE HCL (PRESSORS) 10 MG/ML IV SOLN
INTRAVENOUS | Status: DC | PRN
  Administered 2021-10-22 (×3): 160 ug via INTRAVENOUS

## 2021-10-22 MED ORDER — DIATRIZOATE MEGLUMINE 30 % UR SOLN
URETHRAL | Status: DC | PRN
  Administered 2021-10-22: 100 mL via URETHRAL

## 2021-10-22 MED ORDER — ORAL CARE MOUTH RINSE: 15.0000 mL | Freq: Once | OROMUCOSAL | Status: AC

## 2021-10-22 MED ORDER — FENTANYL CITRATE PF 50 MCG/ML IJ SOSY: 25.0000 ug | PREFILLED_SYRINGE | INTRAMUSCULAR | Status: DC | PRN

## 2021-10-22 MED ORDER — LIDOCAINE HCL URETHRAL/MUCOSAL 2 % EX GEL
CUTANEOUS | Status: DC | PRN
  Administered 2021-10-22: 1

## 2021-10-22 MED ORDER — HYDROCODONE-ACETAMINOPHEN 5-325 MG PO TABS: 1.0000 | ORAL_TABLET | Freq: Four times a day (QID) | ORAL | 0 refills | Status: DC | PRN

## 2021-10-22 MED ORDER — LACTATED RINGERS IV SOLN: INTRAVENOUS | Status: DC

## 2021-10-22 MED ORDER — MIDAZOLAM HCL 2 MG/2ML IJ SOLN
INTRAMUSCULAR | Status: AC
  Filled 2021-10-22: qty 2

## 2021-10-22 MED ORDER — PROPOFOL 10 MG/ML IV BOLUS
INTRAVENOUS | Status: AC
  Filled 2021-10-22: qty 20

## 2021-10-22 MED ORDER — DEXAMETHASONE SODIUM PHOSPHATE 10 MG/ML IJ SOLN
INTRAMUSCULAR | Status: DC | PRN
  Administered 2021-10-22: 6 mg via INTRAVENOUS

## 2021-10-22 MED ORDER — SODIUM CHLORIDE 0.9 % IR SOLN
Status: DC | PRN
  Administered 2021-10-22 (×2): 3000 mL

## 2021-10-22 MED ORDER — GENTAMICIN SULFATE 40 MG/ML IJ SOLN
5.0000 mg/kg | INTRAVENOUS | Status: AC
  Administered 2021-10-22: 326.4 mg via INTRAVENOUS
  Filled 2021-10-22: qty 8.25

## 2021-10-22 SURGICAL SUPPLY — 24 items
BAG DRAIN URO TABLE W/ADPT NS (BAG) ×2 IMPLANT
BAG DRN 8 ADPR NS SKTRN CSTL (BAG) ×2
BAG HAMPER (MISCELLANEOUS) ×2 IMPLANT
CATH URET 5FR 28IN OPEN ENDED (CATHETERS) ×2 IMPLANT
CLOTH BEACON ORANGE TIMEOUT ST (SAFETY) ×2 IMPLANT
COVER MAYO STAND XLG (MISCELLANEOUS) ×2 IMPLANT
EXTRACTOR STONE NITINOL NGAGE (UROLOGICAL SUPPLIES) IMPLANT
GLOVE BIO SURGEON STRL SZ8 (GLOVE) ×2 IMPLANT
GLOVE BIOGEL PI IND STRL 7.0 (GLOVE) ×4 IMPLANT
GOWN STRL REUS W/TWL LRG LVL3 (GOWN DISPOSABLE) ×2 IMPLANT
GOWN STRL REUS W/TWL XL LVL3 (GOWN DISPOSABLE) ×2 IMPLANT
GUIDEWIRE STR DUAL SENSOR (WIRE) ×2 IMPLANT
IV NS IRRIG 3000ML ARTHROMATIC (IV SOLUTION) ×4 IMPLANT
KIT TURNOVER CYSTO (KITS) ×2 IMPLANT
MANIFOLD NEPTUNE II (INSTRUMENTS) ×2 IMPLANT
PACK CYSTO (CUSTOM PROCEDURE TRAY) ×2 IMPLANT
PAD ARMBOARD 7.5X6 YLW CONV (MISCELLANEOUS) ×2 IMPLANT
STENT URET 6FRX28 CONTOUR (STENTS) IMPLANT
SYR 10ML LL (SYRINGE) ×2 IMPLANT
SYR CONTROL 10ML LL (SYRINGE) IMPLANT
TOWEL OR 17X26 4PK STRL BLUE (TOWEL DISPOSABLE) ×2 IMPLANT
TRACTIP FLEXIVA PULS ID 200XHI (Laser) IMPLANT
TRACTIP FLEXIVA PULSE ID 200 (Laser) ×2
WATER STERILE IRR 500ML POUR (IV SOLUTION) ×2 IMPLANT

## 2021-10-22 NOTE — Interval H&P Note (Signed)
History and Physical Interval Note:  10/22/2021 8:57 AM  Alec Bruce  has presented today for surgery, with the diagnosis of left UVJ stone, elevated PSA.  The various methods of treatment have been discussed with the patient and family. After consideration of risks, benefits and other options for treatment, the patient has consented to  Procedure(s): CYSTOSCOPY WITH RETROGRADE PYELOGRAM, URETEROSCOPY AND STENT PLACEMENT (Left) HOLMIUM LASER APPLICATION (Left) PROSTATE BIOPSY (N/A) as a surgical intervention.  The patient's history has been reviewed, patient examined, no change in status, stable for surgery.  I have reviewed the patient's chart and labs.  Questions were answered to the patient's satisfaction.     Michaelle Birks

## 2021-10-22 NOTE — Anesthesia Procedure Notes (Signed)
Procedure Name: LMA Insertion Date/Time: 10/22/2021 9:35 AM  Performed by: Jonna Munro, CRNAPre-anesthesia Checklist: Patient identified, Emergency Drugs available, Suction available, Patient being monitored and Timeout performed Patient Re-evaluated:Patient Re-evaluated prior to induction Oxygen Delivery Method: Circle system utilized Preoxygenation: Pre-oxygenation with 100% oxygen Induction Type: IV induction LMA: LMA inserted LMA Size: 4.0 Number of attempts: 1 Placement Confirmation: positive ETCO2, CO2 detector and breath sounds checked- equal and bilateral Tube secured with: Tape Dental Injury: Teeth and Oropharynx as per pre-operative assessment

## 2021-10-22 NOTE — Transfer of Care (Signed)
Immediate Anesthesia Transfer of Care Note  Patient: Alec Bruce  Procedure(s) Performed: CYSTOSCOPY WITH RETROGRADE PYELOGRAM, URETEROSCOPY AND STENT PLACEMENT (Left: Ureter) HOLMIUM LASER APPLICATION (Left: Ureter) PROSTATE BIOPSY (Prostate)  Patient Location: PACU  Anesthesia Type:General  Level of Consciousness: awake, alert , drowsy and patient cooperative  Airway & Oxygen Therapy: Patient Spontanous Breathing and Patient connected to face mask oxygen  Post-op Assessment: Report given to RN, Post -op Vital signs reviewed and stable and Patient moving all extremities X 4  Post vital signs: Reviewed stable  Last Vitals:  Vitals Value Taken Time  BP 112/84 10/22/21 1055  Temp    Pulse 91 10/22/21 1056  Resp 10 10/22/21 1056  SpO2 100 % 10/22/21 1056  Vitals shown include unvalidated device data.  Last Pain:  Vitals:   10/22/21 0817  TempSrc: Oral         Complications: No notable events documented.

## 2021-10-22 NOTE — Op Note (Addendum)
OPERATIVE NOTE   Patient Name: Alec Bruce  MRN: [id number]   Date of Procedure: 10/22/21   Preoperative diagnosis:  Elevated PSA Left ureteral calculus  Postoperative diagnosis:  Elevated PSA Left ureteral calculus  Procedure:  Transrectal ultrasound and biopsy of prostate Cystoscopy with left retrograde pyelogram Left ureteroscopic laser lithotripsy with stone manipulation (fragments obtained) Insertion of left ureteral stent (6 [place] by 28 cm, with tether) Intraoperative fluoroscopy with real-time interpretation  Attending: Primus Bravo, MD  Anesthesia: General  Estimated blood loss: 5 mL  Fluids: Per anesthesia record  Drains: 6 French by 28 cm left ureteral stent with tether  Specimens: Stone fragments; prostate biopsy specimens x 12  Antibiotics: Rocephin 2 g IV, gentamicin 330 mg IV  Findings: Prostate volume = 38 cm; lateral lobe enlargement of the prostate; no bladder lesions; edema of left ureteral orifice; 7 x 9 mm calculus in distal left ureter  Indications:  66 year old male found to have elevated PSA and microscopic hematuria and a left ureteral calculus.  His PSA from 09/04/2021 increased to 15.3.  CT imaging from 09/18/2021 showed a 9 mm left UVJ stone without significant obstruction.  He presents now for surgical management with transrectal ultrasound and biopsy of the prostate, cystoscopy with left retrograde pyelogram, left ureteroscopy with laser lithotripsy, and insertion of left ureteral stent.  Risk and benefits of the procedure were discussed in detail.  He understands and wishes to proceed as described.  Description of Procedure:  The patient received IV Rocephin and gentamicin preoperatively.  After successful induction of a general anesthetic, the patient was placed in the left lateral decubitus position.  TRANSRECTAL ULTRASOUND OF THE PROSTATE  The 7 MHz transrectal probe was used to image the prostate.  Anal stenosis was  noted.  TRUS volume: 38 ml  Hypoechoic areas: None  Hyperechoic areas: None  Central calcifications: not present  Margins:  normal  PROSTATE BIOPSY Under transrectal ultrasound guidance, and using the Biopty gun, prostate biopsies were obtained systematically from the apex, mid gland, and base bilaterally.  A total of 12 cores were obtained.  Hemostasis was obtained with gentle pressure on the prostate.  The procedures were well-tolerated.  No significant bleeding was noted at the end of the procedure.    The patient was then placed in the dorsolithotomy position.  The patient's genital area was prepped and draped in sterile fashion.  Under direct visualization, a 21 French rigid cystoscope was passed through the urethra and into the bladder.  The patient was noted to have some lateral lobe enlargement of the prostate with some slight elevation of the bladder neck.  The bladder was inspected completely.  No mucosal lesions were seen.  There was edema of the left ureteral orifice noted with a pinpoint orifice.  No stones were seen within the bladder.  A left retrograde pyelogram was performed for evaluation of the patient's left collecting system given the known left ureteral calculus.  Scout film showed a nonspecific bowel gas pattern and no obvious bony abnormalities.  A 7 x 9 mm calcification was noted in the left pelvis consistent with the known left ureteral calculus.  A sensor guidewire was carefully placed into the left ureter with the assistance of an open-ended catheter.  The wire was gently passed beyond the stone and into the left renal pelvis.  The open-ended catheter was then passed up the sensor wire into the proximal ureter.  Contrast was injected revealing a normal-appearing collecting system.  The guidewire was  left in place.  A semirigid ureteroscope was then passed alongside the guidewire into the distal left ureter.  The stone was easily visualized.  Using the holmium laser  fiber, the stone was fragmented into multiple small fragments.  A stone basket was used to retrieve all visible fragments.  Care was taken to avoid any injury to the ureter during fragmentation and retrieval of the stone fragments.  The ureteroscope was passed into the mid ureter and no remaining stone fragments were appreciated.  A 6 French by 28 cm double-J stent was then passed over the guidewire under cystoscopic and fluoroscopic guidance.  A good curl was noted in the left renal pelvis as well as in the bladder.  The tether was left attached and brought through the meatus.  The bladder was drained.  Intraurethral lidocaine jelly was placed at the end of the procedure.  Stone fragments were sent for analysis.  The patient was then extubated and taken to the post anesthesia care unit in stable condition.   Complications: None  Condition: Stable, extubated, transferred to PACU  Plan:  Discharge to home Continue stent for 1 week.

## 2021-10-22 NOTE — Anesthesia Preprocedure Evaluation (Signed)
Anesthesia Evaluation  Patient identified by MRN, date of birth, ID band Patient awake    Reviewed: Allergy & Precautions, H&P , NPO status , Patient's Chart, lab work & pertinent test results, reviewed documented beta blocker date and time   Airway Mallampati: II  TM Distance: >3 FB Neck ROM: full    Dental no notable dental hx.    Pulmonary neg pulmonary ROS, former smoker,    Pulmonary exam normal breath sounds clear to auscultation       Cardiovascular Exercise Tolerance: Good hypertension, negative cardio ROS   Rhythm:regular Rate:Normal     Neuro/Psych TIAnegative psych ROS   GI/Hepatic negative GI ROS, Neg liver ROS,   Endo/Other  negative endocrine ROSdiabetes, Type 2  Renal/GU negative Renal ROS  negative genitourinary   Musculoskeletal   Abdominal   Peds  Hematology  (+) Blood dyscrasia, anemia ,   Anesthesia Other Findings   Reproductive/Obstetrics negative OB ROS                             Anesthesia Physical Anesthesia Plan  ASA: 3  Anesthesia Plan: General and General LMA   Post-op Pain Management:    Induction:   PONV Risk Score and Plan: Ondansetron  Airway Management Planned:   Additional Equipment:   Intra-op Plan:   Post-operative Plan:   Informed Consent: I have reviewed the patients History and Physical, chart, labs and discussed the procedure including the risks, benefits and alternatives for the proposed anesthesia with the patient or authorized representative who has indicated his/her understanding and acceptance.     Dental Advisory Given  Plan Discussed with: CRNA  Anesthesia Plan Comments:         Anesthesia Quick Evaluation

## 2021-10-23 ENCOUNTER — Telehealth: Payer: Self-pay

## 2021-10-23 NOTE — Telephone Encounter (Signed)
Patient called office to inquire about "tape to leg"  After received OP note- patient tether stent with tape for 1 week. Discussed with patient to keep tape in place to keep stent secure. Patient voiced understanding and denies any issues. Patient scheduled to keep post op next week.

## 2021-10-23 NOTE — Anesthesia Postprocedure Evaluation (Signed)
Anesthesia Post Note  Patient: Morio Widen  Procedure(s) Performed: CYSTOSCOPY WITH RETROGRADE PYELOGRAM, URETEROSCOPY AND STENT PLACEMENT (Left: Ureter) HOLMIUM LASER APPLICATION (Left: Ureter) PROSTATE BIOPSY (Prostate)  Patient location during evaluation: Phase II Anesthesia Type: General Level of consciousness: awake Pain management: pain level controlled Vital Signs Assessment: post-procedure vital signs reviewed and stable Respiratory status: spontaneous breathing and respiratory function stable Cardiovascular status: blood pressure returned to baseline and stable Postop Assessment: no headache and no apparent nausea or vomiting Anesthetic complications: no Comments: Late entry   No notable events documented.   Last Vitals:  Vitals:   10/22/21 1115 10/22/21 1133  BP: 122/76 (P) 122/78  Pulse: 94 (P) 93  Resp: 14 (P) 16  Temp:  (P) 36.6 C  SpO2: 100% (P) 100%    Last Pain:  Vitals:   10/22/21 1133  TempSrc: (P) Oral  PainSc: (P) 0-No pain                 Louann Sjogren

## 2021-10-29 LAB — CALCULI, WITH PHOTOGRAPH (CLINICAL LAB)
Calcium Oxalate Monohydrate: 100 %
Weight Calculi: 186 mg

## 2021-10-30 ENCOUNTER — Encounter (HOSPITAL_COMMUNITY): Payer: Self-pay | Admitting: Urology

## 2021-10-30 ENCOUNTER — Ambulatory Visit (INDEPENDENT_AMBULATORY_CARE_PROVIDER_SITE_OTHER): Payer: Medicare Other | Admitting: Urology

## 2021-10-30 VITALS — BP 101/60 | HR 101

## 2021-10-30 DIAGNOSIS — E1129 Type 2 diabetes mellitus with other diabetic kidney complication: Secondary | ICD-10-CM | POA: Diagnosis not present

## 2021-10-30 DIAGNOSIS — E1122 Type 2 diabetes mellitus with diabetic chronic kidney disease: Secondary | ICD-10-CM | POA: Diagnosis not present

## 2021-10-30 DIAGNOSIS — Z87442 Personal history of urinary calculi: Secondary | ICD-10-CM

## 2021-10-30 DIAGNOSIS — N189 Chronic kidney disease, unspecified: Secondary | ICD-10-CM | POA: Diagnosis not present

## 2021-10-30 DIAGNOSIS — D638 Anemia in other chronic diseases classified elsewhere: Secondary | ICD-10-CM | POA: Diagnosis not present

## 2021-10-30 DIAGNOSIS — R42 Dizziness and giddiness: Secondary | ICD-10-CM | POA: Diagnosis not present

## 2021-10-30 DIAGNOSIS — E8722 Chronic metabolic acidosis: Secondary | ICD-10-CM | POA: Diagnosis not present

## 2021-10-30 DIAGNOSIS — R82991 Hypocitraturia: Secondary | ICD-10-CM | POA: Diagnosis not present

## 2021-10-30 DIAGNOSIS — I952 Hypotension due to drugs: Secondary | ICD-10-CM | POA: Diagnosis not present

## 2021-10-30 DIAGNOSIS — C61 Malignant neoplasm of prostate: Secondary | ICD-10-CM | POA: Diagnosis not present

## 2021-10-30 DIAGNOSIS — N200 Calculus of kidney: Secondary | ICD-10-CM | POA: Diagnosis not present

## 2021-10-30 DIAGNOSIS — R809 Proteinuria, unspecified: Secondary | ICD-10-CM | POA: Diagnosis not present

## 2021-10-30 DIAGNOSIS — N201 Calculus of ureter: Secondary | ICD-10-CM

## 2021-10-30 DIAGNOSIS — Z8679 Personal history of other diseases of the circulatory system: Secondary | ICD-10-CM | POA: Diagnosis not present

## 2021-10-30 NOTE — Progress Notes (Signed)
Subjective: 1. Left ureteral stone   2. Prostate cancer ([place])      10/30/21: Alec Bruce returns today in f/u from his recent left ureteroscopy and prostate biopsy.   He has some hypotension since he got started on the tamsulosin.  He has had no fever or flank pain.  His biopsy showed a 21m prostate with GG1 in 10% of the RAL core and GG2 in 5% of the LAL core.  His prebiopsy PSA was 15.  [facility] predicts 53% OCD, 44% ECE, 5% LNI and 4% SVI.  He doesn't need staging studies.  He has mild LUTS but severe ED.   10/02/21: NJakimreturns today in f/u for his history of an elevated PSA and microhematuria with history of stones.  He had a PSA on 8/3 that was up further to 15.3 and had a CT stone study on 09/18/21 that showed a 951mleft UVJ stone without obstruction. The stone was in the LLLos [place]n 2022.  He has no flank pain or gross hematuria.  He has no urgency.    He has some increased frequency but has increased his fluid intake.   09/04/21: NaTallans a [age]o male who is sent for a recent PSA elevation of 10. He has not had a rectal exam.  He has pass a couple of kidney stones but has no other GU history. He has a reduced stream with some intermittency and post void dribbling.  He has had no hematuria or dysuria. He has had no other recent acute ailments.  He has ED.   He has DM with neuropathy and CKD3b as well as a history of colon cancer with a partial colectomy in the 1990s.  He has 11-30 RBC's on UA today.  He had a CT stone study in 6/22 that showed non-obstructing renal stones.   ROS:  Review of Systems  Neurological:  Positive for tingling and sensory change.  All other systems reviewed and are negative.   No Known Allergies  Past Medical History:  Diagnosis Date   Anemia    [place]   colon cancer 90s   Diabetes mellitus without complication (H[facility]   Hypertension    [facility]   TIA    Past Surgical History:  Procedure Laterality Date   APPENDECTOMY      COLONOSCOPY WITH PROPOFOL N/A 03/31/2021   Procedure: COLONOSCOPY WITH PROPOFOL;  Surgeon: CaEloise HarmanDO;  Location: [facility];  Service: Endoscopy;  Laterality: N/A;  10:00 / ASA 3   CYSTOSCOPY WITH RETROGRADE PYELOGRAM, URETEROSCOPY AND STENT PLACEMENT Left 10/22/2021   Procedure: CYSTOSCOPY WITH RETROGRADE PYELOGRAM, URETEROSCOPY AND STENT PLACEMENT;  Surgeon: StPrimus Bravo MD;  Location: [facility] ORS;  Service: Urology;  Laterality: Left;   HOLMIUM LASER APPLICATION Left 10/05/38/9735 Procedure: HOLMIUM LASER APPLICATION;  Surgeon: StPrimus Bravo MD;  Location: [facility] ORS;  Service: Urology;  Laterality: Left;   PARTIAL COLECTOMY  1990   POLYPECTOMY  03/31/2021   Procedure: POLYPECTOMY;  Surgeon: CaEloise HarmanDO;  Location: [facility];  Service: Endoscopy;;   PROSTATE BIOPSY N/A 10/22/2021   Procedure: PROSTATE BIOPSY;  Surgeon: StPrimus Bravo MD;  Location: [facility] ORS;  Service: Urology;  Laterality: N/A;    Social History   Socioeconomic History   Marital status: Single    Spouse name: Not on file   Number of children: Not on file   Years of education: Not on file  Highest education level: Not on file  Occupational History   Not on file  Tobacco Use   Smoking status: Former   Smokeless tobacco: Never  Vaping Use   Vaping Use: Never used  Substance and Sexual Activity   Alcohol use: Yes    Comment: occ   Drug use: Not Currently   Sexual activity: Not on file  Other Topics Concern   Not on file  Social History Narrative   Not on file   Social Determinants of Health   Financial Resource Strain: Not on file  Food Insecurity: Not on file  Transportation Needs: Not on file  Physical Activity: Not on file  Stress: Not on file  Social Connections: Not on file  Intimate Partner Violence: Not on file    History reviewed. No pertinent family history.  Anti-infectives: Anti-infectives (From admission, onward)    None       Current  Outpatient Medications  Medication Sig Dispense Refill   aspirin EC 81 MG tablet Take 81 mg by mouth in the morning. Swallow whole.     atorvastatin (LIPITOR) 40 MG tablet Take 1 tablet (40 mg total) by mouth every evening. For stroke prevention 30 tablet 11   Cholecalciferol ([employer] VIT D 5000 HIGH-POTENCY PO) Take 5,000 Units by mouth in the morning.     DULoxetine (CYMBALTA) 60 MG capsule Take 60 mg by mouth at bedtime.     ferrous sulfate 325 (65 FE) MG tablet Take 325 mg by mouth in the morning and at bedtime.     HYDROcodone-acetaminophen (NORCO/VICODIN) 5-325 MG tablet Take 1 tablet by mouth every 6 (six) hours as needed for severe pain. 10 tablet 0   latanoprost (XALATAN) 0.005 % ophthalmic solution Place 1 drop into both eyes at bedtime.     metoprolol succinate (TOPROL XL) 25 MG 24 hr tablet Take 1 tablet (25 mg total) by mouth daily. (Patient taking differently: Take 12.5 mg by mouth in the morning.) 30 tablet 11   phenazopyridine (PYRIDIUM) 200 MG tablet Take 1 tablet (200 mg total) by mouth 3 (three) times daily as needed (pain with urination). 30 tablet 0   senna-docusate (SENOKOT-S) 8.6-50 MG tablet Take 2 tablets by mouth at bedtime. (Patient taking differently: Take 2 tablets by mouth daily as needed (constipation.).) 60 tablet 3   sitaGLIPtin (JANUVIA) 100 MG tablet Take 1 tablet (100 mg total) by mouth daily. (Patient taking differently: Take 100 mg by mouth daily after lunch.) 30 tablet 11   No current facility-administered medications for this visit.     Objective: Vital signs in last 24 hours: BP 101/60   Pulse (!) 101   Intake/Output from previous day: No intake/output data recorded. Intake/Output this shift: '@IOTHISSHIFT'$ @   Physical Exam Vitals reviewed.  Constitutional:      Appearance: Normal appearance.  Cardiovascular:     Rate and Rhythm: Normal rate and regular rhythm.     Heart sounds: Normal heart sounds.  Pulmonary:     Effort: Pulmonary effort is  normal. No respiratory distress.     Breath sounds: Normal breath sounds.  Neurological:     Mental Status: He is alert.     Lab Results:  No results found for this or any previous visit (from the past 24 hour(s)).   BMET No results for input(s): "NA", "K", "CL", "CO2", "GLUCOSE", "BUN", "CREATININE", "CALCIUM" in the last 72 hours.  PT/INR No results for input(s): "LABPROT", "INR" in the last 72 hours. ABG No results for  input(s): "PHART", "HCO3" in the last 72 hours.  Invalid input(s): "PCO2", "PO2" PSA reviewed. Studies/Results: No results found. DG C-Arm 1-60 Min-No Report  Result Date: 10/22/2021 Fluoroscopy was utilized by the requesting physician.  No radiographic interpretation.   US Guided Needle Placement  Result Date: 10/22/2021 CLINICAL DATA:  Ultrasound was provided for use by the ordering physician.  No provider Interpretation or professional fees incurred.    CT RENAL STONE STUDY  Result Date: 09/19/2021 CLINICAL DATA:  Microhematuria. EXAM: CT ABDOMEN AND PELVIS WITHOUT CONTRAST TECHNIQUE: Multidetector CT imaging of the abdomen and pelvis was performed following the standard protocol without IV contrast. RADIATION DOSE REDUCTION: This exam was performed according to the departmental dose-optimization program which includes automated exposure control, adjustment of the mA and/or kV according to patient size and/or use of iterative reconstruction technique. COMPARISON:  08/01/2020 FINDINGS: Lower chest: Unremarkable. Hepatobiliary: No suspicious focal abnormality in the liver on this study without intravenous contrast. Small calcified gallstone evident. No intrahepatic or extrahepatic biliary dilation. Pancreas: No focal mass lesion. No dilatation of the main duct. No intraparenchymal cyst. No peripancreatic edema. Spleen: No splenomegaly. No focal mass lesion. Adrenals/Urinary Tract: No adrenal nodule or mass. Punctate stone noted upper pole right kidney on 20/2. 3 mm  interpolar right renal stone evident. No right ureteral stone. Small cluster of tiny stones noted lower pole left kidney on 30/2 with a punctate nonobstructing interpolar left stone on 27/2. 2.0 cm cyst noted lower pole left kidney. Despite the lack of secondary changes in the left kidney and ureter, a 9 x 7 x 7 mm stone is identified in the left UVJ (image 67/2). Stomach/Bowel: Stomach is unremarkable. No gastric wall thickening. No evidence of outlet obstruction. Duodenum is normally positioned as is the ligament of Treitz. No small bowel wall thickening. No small bowel dilatation. Status post partial colectomy with reanastomosis. Vascular/Lymphatic: There is moderate atherosclerotic calcification of the abdominal aorta without aneurysm. There is no gastrohepatic or hepatoduodenal ligament lymphadenopathy. No retroperitoneal or mesenteric lymphadenopathy. No pelvic sidewall lymphadenopathy. Reproductive: The prostate gland and seminal vesicles are unremarkable. Other: No intraperitoneal free fluid. Musculoskeletal: No worrisome lytic or sclerotic osseous abnormality. IMPRESSION: 1. 9 x 7 x 7 mm left UVJ stone without substantial secondary changes in the left kidney or ureter. 2. Bilateral nonobstructing nephrolithiasis. 3. Cholelithiasis. 4. Aortic Atherosclerosis (ICD10-I70.0). Electronically Signed   By: Misty Stanley M.D.   On: 09/19/2021 11:42      Assessment/Plan:  Hx of left ureteral stone.   Stent was removed.  He will return in a month with a KUB and renal US.  Prostate cancer.   He has T1c Nx Mx GG2 intermediate risk prostate cancer.   I discussed AS, RALP, Seeds and EXRT with him.   I am reluctant to consider AS with his PSA elevation and smaller prostate.   He is a candidate for RALP, Seeds and EXRT but with the prior colon resection he is at greater risk of adhesions.   I reviewed each option and its risks and benefits in detail and also discussed SpaceOAR.   He expressed some interested in  surgical therapy.   I will get him set up for the [facility] clinic in [place] to further explore his options.   No orders of the defined types were placed in this encounter.    Orders Placed This Encounter  Procedures   DG Abd 1 View    Standing Status:   Future    Standing Expiration Date:  04/30/2022    Order Specific Question:   Reason for Exam (SYMPTOM  OR DIAGNOSIS REQUIRED)    Answer:   ureteral stone    Order Specific Question:   Preferred imaging location?    Answer:   [facility]    Order Specific Question:   Radiology Contrast Protocol - do NOT remove file path    Answer:   [email or web address]   US RENAL    Standing Status:   Future    Standing Expiration Date:   04/30/2022    Order Specific Question:   Reason for Exam (SYMPTOM  OR DIAGNOSIS REQUIRED)    Answer:   ureteral stone    Order Specific Question:   Preferred imaging location?    Answer:   [facility]   Ambulatory referral to Urology    Referral Priority:   Routine    Referral Type:   Consultation    Referral Reason:   Specialty Services Required    Requested Specialty:   Urology    Number of Visits Requested:   1   POCT urinalysis dipstick     Return in about 4 weeks (around 11/27/2021) for F/u in 1 month with imaging with me or Sharee Pimple.   Needs appt with prostate [facility] in [place]. .    CC: Dr. Celene Squibb.      Irine Seal 10/30/2021 [phone]

## 2021-11-04 NOTE — Progress Notes (Signed)
I called pt to introduce myself as the Prostate Nurse Navigator and the Coordinator of the Prostate [place].   1. I confirmed with the patient he is aware of his referral to the clinic 10/13, arriving @ 8:00 am.    2. I discussed the format of the clinic and the physicians he will be seeing that day.   3. I discussed where the clinic is located and how to contact me.   4. I confirmed his address and informed him I would be mailing a packet of information and forms to be completed. I asked him to bring them with him the day of his appointment.    He voiced understanding of the above. I asked him to call me if he has any questions or concerns regarding his appointments or the forms he needs to complete.

## 2021-11-13 NOTE — Progress Notes (Signed)
RN spoke with patient to remind him of upcoming appointment for [facility] on 10/13.  Pt confirmed he received packet and will bring at upcoming appointment.

## 2021-11-13 NOTE — Progress Notes (Signed)
Radiation Oncology         [phone] ________________________________  Multidisciplinary Prostate Cancer Clinic  Initial Radiation Oncology Consultation  Name: Alec Bruce MRN: [id number]  Date: 11/14/2021  DOB: 1955-08-25  [place]:HCWC, [name], MD  Raynelle Bring, MD   REFERRING PHYSICIAN: Raynelle Bring, MD  DIAGNOSIS: 66 y.o. gentleman with stage T1c adenocarcinoma of the prostate with a Gleason's score of 3+4 and a PSA of 15.3    ICD-10-CM   1. Prostate cancer ([place])  C61       HISTORY OF PRESENT ILLNESS::Alec Bruce is a 66 y.o. gentleman with a history of colon cancer treated with partial colectomy in the 1990's.  He was noted to have an elevated PSA of 10 by his primary care physician, Dr. Nevada Crane.  Accordingly, he was referred for evaluation in urology by Dr. Jeffie Pollock on 09/04/21,  digital rectal examination performed at that time was benign. Repeat PSA obtained that day showed further elevation to 15.3. He was also noted to have microhematuria on urinalysis that day, prompting renal stone protocol CT A/P on 09/18/21 showing a 9 mm left UVJ stone. Of note, prostate, seminal vesicles, and local lymph nodes were unremarkable. He was taken for cystoscopy, lithotripsy, and ureteral stent placement on 10/22/21, and prostate biopsy was performed at the same time. The prostate volume measured 38 cc.  Out of 12 core biopsies, 2 were positive.  The maximum Gleason score was 3+4, and this was seen in the left apex lateral (small focus) and right apex lateral.  The patient reviewed the biopsy results with his urologist and he has kindly been referred today to the multidisciplinary prostate cancer clinic for presentation of pathology and radiology studies in our conference for discussion of potential radiation treatment options and clinical evaluation.  PREVIOUS RADIATION THERAPY: No  PAST MEDICAL HISTORY:  has a past medical history of Anemia, Cancer ([place]), Diabetes mellitus without  complication ([place]), Hypertension, and Stroke ([place]).    PAST SURGICAL HISTORY: Past Surgical History:  Procedure Laterality Date   APPENDECTOMY     COLONOSCOPY WITH PROPOFOL N/A 03/31/2021   Procedure: COLONOSCOPY WITH PROPOFOL;  Surgeon: Eloise Harman, DO;  Location[facility];  Service: Endoscopy;  Laterality: N/A;  10:00 / ASA 3   CYSTOSCOPY WITH RETROGRADE PYELOGRAM, URETEROSCOPY AND STENT PLACEMENT Left 10/22/2021   Procedure: CYSTOSCOPY WITH RETROGRADE PYELOGRAM, URETEROSCOPY AND STENT PLACEMENT;  Surgeon: Primus Bravo., MD;  Location: [facility] ORS;  Service: Urology;  Laterality: Left;   HOLMIUM LASER APPLICATION Left 3/76/2831   Procedure: HOLMIUM LASER APPLICATION;  Surgeon: Primus Bravo., MD;  Location: [facility] ORS;  Service: Urology;  Laterality: Left;   PARTIAL COLECTOMY  1990   POLYPECTOMY  03/31/2021   Procedure: POLYPECTOMY;  Surgeon: Eloise Harman, DO;  Location: [facility];  Service: Endoscopy;;   PROSTATE BIOPSY N/A 10/22/2021   Procedure: PROSTATE BIOPSY;  Surgeon: Primus Bravo., MD;  Location: [facility] ORS;  Service: Urology;  Laterality: N/A;    FAMILY HISTORY: family history includes Colon cancer in his cousin; Colon cancer (age of onset: 47 - 74) in his mother.  SOCIAL HISTORY:  reports that he has quit smoking. He has never used smokeless tobacco. He reports current alcohol use. He reports that he does not currently use drugs.  ALLERGIES: Patient has no known allergies.  MEDICATIONS:  Current Outpatient Medications  Medication Sig Dispense Refill   aspirin EC 81 MG tablet Take 81 mg by mouth in the morning. Swallow whole.  atorvastatin (LIPITOR) 40 MG tablet Take 1 tablet (40 mg total) by mouth every evening. For stroke prevention 30 tablet 11   Cholecalciferol (CVS VIT D 5000 HIGH-POTENCY PO) Take 5,000 Units by mouth in the morning.     DULoxetine (CYMBALTA) 60 MG capsule Take 60 mg by mouth at bedtime.     ferrous sulfate 325 (65 FE) MG  tablet Take 325 mg by mouth in the morning and at bedtime.     HYDROcodone-acetaminophen (NORCO/VICODIN) 5-325 MG tablet Take 1 tablet by mouth every 6 (six) hours as needed for severe pain. 10 tablet 0   latanoprost (XALATAN) 0.005 % ophthalmic solution Place 1 drop into both eyes at bedtime.     metoprolol succinate (TOPROL XL) 25 MG 24 hr tablet Take 1 tablet (25 mg total) by mouth daily. (Patient taking differently: Take 12.5 mg by mouth in the morning.) 30 tablet 11   phenazopyridine (PYRIDIUM) 200 MG tablet Take 1 tablet (200 mg total) by mouth 3 (three) times daily as needed (pain with urination). 30 tablet 0   senna-docusate (SENOKOT-S) 8.6-50 MG tablet Take 2 tablets by mouth at bedtime. (Patient taking differently: Take 2 tablets by mouth daily as needed (constipation.).) 60 tablet 3   sitaGLIPtin (JANUVIA) 100 MG tablet Take 1 tablet (100 mg total) by mouth daily. (Patient taking differently: Take 100 mg by mouth daily after lunch.) 30 tablet 11   No current facility-administered medications for this encounter.    REVIEW OF SYSTEMS:  On review of systems, the patient reports that he is doing well overall. He denies any chest pain, shortness of breath, cough, fevers, chills, night sweats, unintended weight changes. He denies any bowel disturbances, and denies abdominal pain, nausea or vomiting. He denies any new musculoskeletal or joint aches or pains. His IPSS was 5, indicating mild urinary symptoms. His SHIM was 5, indicating he has severe erectile dysfunction. A complete review of systems is obtained and is otherwise negative.   PHYSICAL EXAM:  Wt Readings from Last 3 Encounters:  11/14/21 146 lb (66.2 kg)  10/21/21 144 lb (65.3 kg)  10/02/21 144 lb (65.3 kg)   Temp Readings from Last 3 Encounters:  11/14/21 (!) 96.7 F (35.9 C) (Temporal)  10/22/21 (P) 97.9 F (36.6 C) ((P) Oral)  10/21/21 97.8 F (36.6 C) (Oral)   BP Readings from Last 3 Encounters:  11/14/21 114/76   10/30/21 101/60  10/22/21 (P) 122/78   Pulse Readings from Last 3 Encounters:  11/14/21 96  10/30/21 (!) 101  10/22/21 (P) 93    /10  In general this is a well appearing African-American male in no acute distress. He's alert and oriented x4 and appropriate throughout the examination. Cardiopulmonary assessment is negative for acute distress and he exhibits normal effort.    KPS = 100  100 - Normal; no complaints; no evidence of disease. 90   - Able to carry on normal activity; minor signs or symptoms of disease. 80   - Normal activity with effort; some signs or symptoms of disease. 76   - Cares for self; unable to carry on normal activity or to do active work. 60   - Requires occasional assistance, but is able to care for most of his personal needs. 50   - Requires considerable assistance and frequent medical care. 64   - Disabled; requires special care and assistance. 73   - Severely disabled; hospital admission is indicated although death not imminent. 39   - Very sick; hospital  admission necessary; active supportive treatment necessary. 10   - Moribund; fatal processes progressing rapidly. 0     - Dead  Karnofsky DA, [facility], Craver LS and Burchenal [facility] [phone]) The use of the nitrogen mustards in the palliative treatment of carcinoma: with particular reference to bronchogenic carcinoma Cancer 1 634-56   LABORATORY DATA:  Lab Results  Component Value Date   WBC 5.2 10/21/2021   HGB 11.3 (L) 10/21/2021   HCT 35.4 (L) 10/21/2021   MCV 94.1 10/21/2021   PLT 193 10/21/2021   Lab Results  Component Value Date   NA 137 09/04/2021   K 4.7 09/04/2021   CL 106 09/04/2021   CO2 16 (L) 09/04/2021   Lab Results  Component Value Date   ALT 33 08/01/2020   AST 29 08/01/2020   ALKPHOS 74 08/01/2020   BILITOT 1.7 (H) 08/01/2020     RADIOGRAPHY: DG C-Arm 1-60 Min-No Report  Result Date: 10/22/2021 Fluoroscopy was utilized by the requesting physician.  No radiographic  interpretation.   US Guided Needle Placement  Result Date: 10/22/2021 CLINICAL DATA:  Ultrasound was provided for use by the ordering physician.  No provider Interpretation or professional fees incurred.       IMPRESSION/PLAN: 66 y.o. gentleman with Stage T1c adenocarcinoma of the prostate with a Gleason score of 3+4 and a PSA of 15.3.    We discussed the patient's workup and outlined the nature of prostate cancer in this setting. The patient's T stage, Gleason's score, and PSA put him into the unfavorable intermediate risk group. Accordingly, he is eligible for a variety of potential treatment options including brachytherapy, 5.5 weeks of external radiation, or prostatectomy. We discussed the available radiation techniques, and focused on the details and logistics of delivery. We discussed and outlined the risks, benefits, short and long-term effects associated with radiotherapy and compared and contrasted these with prostatectomy. We discussed the role of SpaceOAR gel in reducing the rectal toxicity associated with radiotherapy. He appears to have a good understanding of his disease and our treatment recommendations which are of curative intent.  He was encouraged to ask questions that were answered to his stated satisfaction.  At the end of the conversation the patient remains undecided regarding his treatment preference, considering either RALP or brachytherapy.  He would like to take some additional time to consider his options prior to committing to a specific treatment but has our contact information and will let us know if he ultimately decides to proceed with brachytherapy and we will proceed with treatment planning, accordingly at that time.  We enjoyed meeting him today and look forward to continuing to participate in his care.  He knows that he is welcome to call anytime with any further questions or concerns related to radiation.   We personally spent 60 minutes in this encounter including  chart review, reviewing radiological studies, meeting face-to-face with the patient, entering orders and completing documentation.    Nicholos Johns, PA-C    Tyler Pita, MD  [facility] Direct Dial: [phone]  Fax: [phone] [email or web address]  Skype  LinkedIn   This document serves as a record of services personally performed by Tyler Pita, MD and [name], PA-C. It was created on their behalf by Wilburn Mylar, a trained medical scribe. The creation of this record is based on the scribe's personal observations and the provider's statements to them. This document has been checked and approved by the attending provider.

## 2021-11-14 ENCOUNTER — Other Ambulatory Visit: Payer: Self-pay | Admitting: Genetic Counselor

## 2021-11-14 ENCOUNTER — Inpatient Hospital Stay (HOSPITAL_BASED_OUTPATIENT_CLINIC_OR_DEPARTMENT_OTHER): Payer: Medicare Other | Admitting: Genetic Counselor

## 2021-11-14 ENCOUNTER — Encounter: Payer: Medicare Other | Admitting: Genetic Counselor

## 2021-11-14 ENCOUNTER — Inpatient Hospital Stay: Payer: Medicare Other

## 2021-11-14 ENCOUNTER — Encounter: Payer: Self-pay | Admitting: Genetic Counselor

## 2021-11-14 ENCOUNTER — Ambulatory Visit
Admission: RE | Admit: 2021-11-14 | Discharge: 2021-11-14 | Disposition: A | Discharge status: Home / Self Care | Payer: Medicare Other | Source: Ambulatory Visit | Attending: Radiation Oncology | Admitting: Radiation Oncology

## 2021-11-14 ENCOUNTER — Inpatient Hospital Stay: Payer: Medicare Other | Attending: Oncology | Admitting: Oncology

## 2021-11-14 VITALS — BP 114/76 | HR 96 | Temp 96.7°F | Resp 18 | Ht 72.0 in | Wt 146.0 lb

## 2021-11-14 DIAGNOSIS — Z1379 Encounter for other screening for genetic and chromosomal anomalies: Secondary | ICD-10-CM

## 2021-11-14 DIAGNOSIS — C61 Malignant neoplasm of prostate: Secondary | ICD-10-CM

## 2021-11-14 DIAGNOSIS — Z85038 Personal history of other malignant neoplasm of large intestine: Secondary | ICD-10-CM | POA: Diagnosis not present

## 2021-11-14 DIAGNOSIS — Z87891 Personal history of nicotine dependence: Secondary | ICD-10-CM

## 2021-11-14 DIAGNOSIS — I10 Essential (primary) hypertension: Secondary | ICD-10-CM | POA: Diagnosis not present

## 2021-11-14 DIAGNOSIS — E119 Type 2 diabetes mellitus without complications: Secondary | ICD-10-CM

## 2021-11-14 DIAGNOSIS — Z79899 Other long term (current) drug therapy: Secondary | ICD-10-CM

## 2021-11-14 DIAGNOSIS — Z0100 Encounter for examination of eyes and vision without abnormal findings: Secondary | ICD-10-CM | POA: Diagnosis not present

## 2021-11-14 DIAGNOSIS — I2510 Atherosclerotic heart disease of native coronary artery without angina pectoris: Secondary | ICD-10-CM | POA: Diagnosis not present

## 2021-11-14 DIAGNOSIS — Z800 Family history of malignant neoplasm of digestive organs: Secondary | ICD-10-CM | POA: Insufficient documentation

## 2021-11-14 DIAGNOSIS — C189 Malignant neoplasm of colon, unspecified: Secondary | ICD-10-CM

## 2021-11-14 DIAGNOSIS — Z191 Hormone sensitive malignancy status: Secondary | ICD-10-CM | POA: Diagnosis not present

## 2021-11-14 LAB — GENETIC SCREENING ORDER

## 2021-11-14 NOTE — Progress Notes (Signed)
                               Care Plan Summary  Name: Alec Bruce DOB: [birth year]   Your Medical Team:   Urologist -  Dr. Raynelle Bring, [facility]  Radiation Oncologist - Dr. Tyler Pita, [facility]   Medical Oncologist - Dr. Zola Button, [place]  Recommendations: 1) Radiation   2) Surgery    * These recommendations are based on information available as of today's consult.      Recommendations may change depending on the results of further tests or exams.    Next Steps: 1) Consider your options.  Contact, [name], your nurse navigator, with any questions or treatment decisions.     When appointments need to be scheduled, you will be contacted by [facility] and/or [facility].  Questions?  Please do not hesitate to call Katheren Puller, BSN, RN at [phone] with any questions or concerns.  [name] is your Oncology Nurse Navigator and is available to assist you while you're receiving your medical care at [facility].

## 2021-11-14 NOTE — Progress Notes (Signed)
REFERRING PROVIDER: Zola Button, MD  PRIMARY PROVIDER:  Celene Squibb, MD  PRIMARY REASON FOR VISIT:  Encounter Diagnoses  Name Primary?   Prostate cancer ([place]) Yes   Malignant neoplasm of colon, unspecified part of colon ([place])    Family history of colon cancer     HISTORY OF PRESENT ILLNESS:   Alec Bruce, a 66 y.o. male, was seen for a [place] cancer genetics consultation at the request of Dr. Alen Blew due to a personal and family history of cancer.  Alec Bruce presents to clinic today to discuss the possibility of a hereditary predisposition to cancer, to discuss genetic testing, and to further clarify his future cancer risks, as well as potential cancer risks for family members.   Alec Bruce was diagnosed with prostate cancer at age 89 (Gleason score=7) and colon cancer in the 59s (his late 30s/early 42s).  CANCER HISTORY:  Oncology History  Prostate cancer ([place])  11/14/2021 Initial Diagnosis   Prostate cancer ([place])   11/14/2021 Cancer Staging   Staging form: Prostate, AJCC 8th Edition - Clinical: Stage IIB (cT1c, cN0, cM0, PSA: 15.3, Grade Group: 2) - Signed by Wyatt Portela, MD on 11/14/2021 Prostate specific antigen (PSA) range: 10 to 19 Gleason score: 7 Histologic grading system: 5 grade system     Past Medical History:  Diagnosis Date   Anemia    Cancer ([place])    colon cancer 90s   Diabetes mellitus without complication ([place])    Hypertension    Stroke ([place])    TIA    Past Surgical History:  Procedure Laterality Date   APPENDECTOMY     COLONOSCOPY WITH PROPOFOL N/A 03/31/2021   Procedure: COLONOSCOPY WITH PROPOFOL;  Surgeon: Eloise Harman, DO;  [facility];  Service: Endoscopy;  Laterality: N/A;  10:00 / ASA 3   CYSTOSCOPY WITH RETROGRADE PYELOGRAM, URETEROSCOPY AND STENT PLACEMENT Left 10/22/2021   Procedure: CYSTOSCOPY WITH RETROGRADE PYELOGRAM, URETEROSCOPY AND STENT PLACEMENT;  Surgeon: Primus Bravo., MD;  Location: [facility];   Service: Urology;  Laterality: Left;   HOLMIUM LASER APPLICATION Left 07/04/930   Procedure: HOLMIUM LASER APPLICATION;  Surgeon: Primus Bravo., MD;  Location: [facility];  Service: Urology;  Laterality: Left;   PARTIAL COLECTOMY  1990   POLYPECTOMY  03/31/2021   Procedure: POLYPECTOMY;  Surgeon: Eloise Harman, DO;  [facility];  Service: Endoscopy;;   PROSTATE BIOPSY N/A 10/22/2021   Procedure: PROSTATE BIOPSY;  Surgeon: Primus Bravo., MD;  Location: [facility];  Service: Urology;  Laterality: N/A;    Social History   Socioeconomic History   Marital status: Single    Spouse name: Not on file   Number of children: Not on file   Years of education: Not on file   Highest education level: Not on file  Occupational History   Not on file  Tobacco Use   Smoking status: Former   Smokeless tobacco: Never  Vaping Use   Vaping Use: Never used  Substance and Sexual Activity   Alcohol use: Yes    Comment: occ   Drug use: Not Currently   Sexual activity: Not on file  Other Topics Concern   Not on file  Social History Narrative   Not on file   Social Determinants of Health   Financial Resource Strain: Not on file  Food Insecurity: Not on file  Transportation Needs: Not on file  Physical Activity: Not on file  Stress: Not on file  Social Connections: Not on file     FAMILY HISTORY:  We obtained a detailed, 4-generation family history.  Significant diagnoses are listed below: Family History  Problem Relation Age of Onset   Colon cancer Mother 43 - 64   Colon cancer Cousin      Alec Bruce mother was diagnosed with colon cancer in her 69s, she died at age 57. One maternal first cousin was diagnosed with colon cancer at an unknown age. Alec Bruce is unaware of previous family history of genetic testing for hereditary cancer risks. There is no reported Ashkenazi Jewish ancestry.   GENETIC COUNSELING ASSESSMENT: Alec Bruce is a 66 y.o. male with a personal  and family history of cancer which is somewhat suggestive of a hereditary predisposition to cancer given his young age at diagnosis of colon cancer. We, therefore, discussed and recommended the following at today's visit.   DISCUSSION: We discussed that 5 - 10% of cancer is hereditary, with most cases of colon cancer associated with Lynch Syndrome.  There are other genes that can be associated with hereditary colon and prostate cancer syndromes.  We discussed that testing is beneficial for several reasons including knowing how to follow individuals after completing their treatment and understanding if other family members could be at risk for cancer and allowing them to undergo genetic testing.   We reviewed the characteristics, features and inheritance patterns of hereditary cancer syndromes. We also discussed genetic testing, including the appropriate family members to test, the process of testing, insurance coverage and turn-around-time for results. We discussed the implications of a negative, positive, carrier and/or variant of uncertain significant result. We recommended Alec Bruce pursue genetic testing for a panel that includes genes associated with colon and prostate cancer.   Alec Bruce elected to have [facility] CancerNext Panel. The CancerNext gene panel offered by [employer] includes sequencing, rearrangement analysis, and RNA analysis for the following 36 genes:   APC, ATM, AXIN2, BARD1, BMPR1A, BRCA1, BRCA2, BRIP1, CDH1, CDK4, CDKN2A, CHEK2, DICER1, HOXB13, EPCAM, GREM1, MLH1, MSH2, MSH3, MSH6, MUTYH, NBN, NF1, NTHL1, PALB2, PMS2, POLD1, POLE, PTEN, RAD51C, RAD51D, RECQL, SMAD4, SMARCA4, STK11, and TP53.   Based on Alec Bruce personal and family history of cancer, he meets medical criteria for genetic testing. Despite that he meets criteria, he may still have an out of pocket cost. We discussed that if his out of pocket cost for testing is over $100, the laboratory will call and confirm  whether he wants to proceed with testing.  If the out of pocket cost of testing is less than $100 he will be billed by the genetic testing laboratory.   PLAN: After considering the risks, benefits, and limitations, Alec Bruce provided informed consent to pursue genetic testing and the blood sample was sent to [facility] for analysis of the CancerNext Panel. Results should be available within approximately 2-3 weeks' time, at which point they will be disclosed by telephone to Alec Bruce, as will any additional recommendations warranted by these results. Alec Bruce will receive a summary of his genetic counseling visit and a copy of his results once available. This information will also be available in Epic.   Alec Bruce questions were answered to his satisfaction today. Our contact information was provided should additional questions or concerns arise. Thank you for the referral and allowing [place] to share in the care of your patient.   Lucille Passy, MS, [facility] Genetic Counselor [email or web address] (P) [phone]  The patient was seen for a total  of <15 minutes in face-to-face genetic counseling. The patient was seen alone.  Drs. Lindi Adie and/or Burr Medico were available to discuss this case as needed.   _______________________________________________________________________ For Office Staff:  Number of people involved in session: 1 Was an Intern/ student involved with case: no

## 2021-11-14 NOTE — Consult Note (Signed)
[facility]     11/14/2021   --------------------------------------------------------------------------------   Alec Bruce  MRN: [id number]  DOB: October 31, 1955, 66 year old Male  SSN:    PRIMARY CARE:    REFERRING:  Alec Seal, MD  PROVIDER:  Raynelle Bruce, M.D.  LOCATION:  [facility] [phone]     --------------------------------------------------------------------------------   CC/HPI: CC: Prostate Cancer   Physician requesting consult: Dr. Irine Bruce  PCP: Dr. Edwyna Bruce hall  Location of consult: [facility] - Prostate Cancer [facility]isciplinary [facility]   Alec Bruce is a 66 year old with a past medical history of diabetes, colon cancer s/p partial colectomy, hypertension, TIA, hyperlipidemia, and depression. He was noted to have a 9 mm left distal ureteral stone on CT imaging on 09/19/21 and underwent ureteroscopic laser lithotripsy by Alec Bruce on 10/22/21. He was also noted to have a PSA of 15.3 prompting a TRUS biopsy at the time of his ureteroscopy and this confirmed Gleason 3+4=7 adenocarcinoma with 2 out of 12 biopsy cores positive for malignancy.   Family history: None.   Imaging studies: None.   PMH: He has a history of diabetes, colon cancer, hypertension, TIA, hyperlipidemia, depression and glaucoma. He has no evidence of disease recurrence with regard to his colon cancer after treatment with partial colectomy and adjuvant chemotherapy in the 1990s. His most recent colonoscopy was earlier this year.  PSH: Partial colectomy (1990s), appendectomy.   TNM stage: cT1c N0 Mx  PSA: 15.3  Gleason score: 3+4=7 (GG 2)  Biopsy (10/22/21): 2/12 cores positive  Left: L lateral apex (5%, 3+4=7)  Right: R lateral apex (10%, 3+3=6)  Prostate volume: 38.4 cc  PSAD: 0.40   Nomogram  OC disease: 53%  EPE: 44%  SVI: 4%  LNI: 5%  PFS (5 year, 10 year): %,%   Urinary function: IPSS is 5.  Erectile function: SHIM score is 5. He  has severe erectile dysfunction. This is a low priority to his overall quality of life.     ALLERGIES: None   MEDICATIONS: Aspirin 81 mg tablet,chewable  Metoprolol Tartrate 25 mg tablet  Atorvastatin Calcium 40 mg tablet  Duloxetine Hcl 60 mg capsule,delayed release  Ferrous Sulfate  Januvia 100 mg tablet  Latanoprost 0.005 % drops     GU PSH: Ureteroscopic laser litho     NON-GU PSH: Appendectomy (laparoscopic) Partial colectomy     GU PMH: None   NON-GU PMH: Colon Cancer, History Depression Diabetes Type 2 Glaucoma Hypercholesterolemia Hypertension Stroke/TIA    FAMILY HISTORY: Prostate Cancer - No Family History   SOCIAL HISTORY: Marital Status: Unknown Current Smoking Status: Patient does not smoke anymore.   Tobacco Use Assessment Completed: Used Tobacco in last 30 days?    REVIEW OF SYSTEMS:    GU Review Male:   Patient denies frequent urination, hard to postpone urination, burning/ pain with urination, get up at night to urinate, leakage of urine, stream starts and stops, trouble starting your streams, and have to strain to urinate .  Gastrointestinal (Lower):   Patient denies diarrhea and constipation.  Gastrointestinal (Upper):   Patient denies nausea and vomiting.  Constitutional:   Patient denies fever, night sweats, weight loss, and fatigue.  Skin:   Patient denies skin rash/ lesion and itching.  Eyes:   Patient denies blurred vision and double vision.  Ears/ Nose/ Throat:   Patient denies sore throat and sinus problems.  Hematologic/Lymphatic:   Patient denies swollen glands and easy bruising.  Cardiovascular:  Patient denies leg swelling and chest pains.  Respiratory:   Patient denies cough and shortness of breath.  Endocrine:   Patient denies excessive thirst.  Musculoskeletal:   Patient denies joint pain and back pain.  Neurological:   Patient denies headaches and dizziness.  Psychologic:   Patient denies depression and anxiety.   VITAL  SIGNS: None   GU PHYSICAL EXAMINATION:    Prostate: Prostate about 40 grams. Left lobe normal consistency, right lobe normal consistency. Symmetrical lobes. No prostate nodule. Left lobe no tenderness, right lobe no tenderness.    MULTI-SYSTEM PHYSICAL EXAMINATION:    Constitutional: Well-nourished. No physical deformities. Normally developed. Good grooming.  Respiratory: No labored breathing, no use of accessory muscles.   Cardiovascular: Normal temperature, normal extremity pulses, no swelling, no varicosities.  Gastrointestinal: No mass, no tenderness, no rigidity, non obese abdomen. He has a well-healed midline laparotomy incision from his xiphoid process around the left side of his umbilicus to a point midway between his pubic symphysis and umbilicus. He also has a small right lower quadrant incision from his prior appendectomy.     Complexity of Data:  Lab Test Review:   PSA  Records Review:   Previous Patient Records   PROCEDURES: None   ASSESSMENT:      ICD-10 Details  1 GU:   Prostate Cancer - C61    PLAN:           Document Letter(s):  Created for Patient: Clinical Summary         Notes:   1. Unfavorable intermediate risk prostate cancer: I had a detailed discussion with Alec Bruce today regarding his options for management/treatment of his newly diagnosed prostate cancer. The patient was counseled about the natural history of prostate cancer and the standard treatment options that are available for prostate cancer. It was explained to him how his age and life expectancy, clinical stage, Gleason score/prognostic grade group, and PSA (and PSA density) affect his prognosis, the decision to proceed with additional staging studies, as well as how that information influences recommended treatment strategies. We discussed the roles for active surveillance, radiation therapy, surgical therapy, androgen deprivation, as well as ablative therapy and other investigational options for the  treatment of prostate cancer as appropriate to his individual cancer situation. We discussed the risks and benefits of these options with regard to their impact on cancer control and also in terms of potential adverse events, complications, and impact on quality of life particularly related to urinary and sexual function. The patient was encouraged to ask questions throughout the discussion today and all questions were answered to his stated satisfaction. In addition, the patient was provided with and/or directed to appropriate resources and literature for further education about prostate cancer and treatment options. We discussed surgical therapy for prostate cancer including the different available surgical approaches. We discussed, in detail, the risks and expectations of surgery with regard to cancer control, urinary control, and erectile function as well as the expected postoperative recovery process. Additional risks of surgery including but not limited to bleeding, infection, hernia formation, nerve damage, lymphocele formation, bowel/rectal injury potentially necessitating colostomy, damage to the urinary tract resulting in urine leakage, urethral stricture, and the cardiopulmonary risks such as myocardial infarction, stroke, death, venothromboembolism, etc. were explained. The risk of open surgical conversion for robotic/laparoscopic prostatectomy was also discussed.   He has met with both Dr. Tammi Klippel and Dr. Alen Blew as well as myself today in the multidisciplinary clinic. He is still making a  final decision as to how he would like to proceed. If he elect surgical therapy, my tentative plan would be to perform a bilateral nerve sparing robot-assisted laparoscopic radical prostatectomy and bilateral pelvic lymphadenectomy. He understands the significant risk of open surgical conversion considering his prior surgical history.   CC: Dr. Irine Bruce  Dr. Wende Neighbors  Dr. Joelene Millin    E & M CODES: We  spent 48 minutes dedicated to evaluation and management time, including face to face interaction, discussions on coordination of care, documentation, result review, and discussion with others as applicable.

## 2021-11-14 NOTE — Progress Notes (Signed)
Reason for the request:    Prostate cancer  HPI: I was asked by Dr. Jeffie Pollock to evaluate Mr. Alec Bruce for the evaluation of prostate cancer.  He is a 66 year old man with history of diabetes, hypertension, coronary disease and ureteral stone.  He was found to have an elevated PSA of 15.3 and underwent a prostate biopsy on October 22, 2021.  He was found to have a Gleason score 3+3 = 6 and a 3+ 4 =7 core.  Clinically, he reports no major urinary symptoms.  He does report low stream but no hematuria, dysuria or frequency.  He does not report any nocturia.   He does not report any headaches, blurry vision, syncope or seizures. Does not report any fevers, chills or sweats.  Does not report any cough, wheezing or hemoptysis.  Does not report any chest pain, palpitation, orthopnea or leg edema.  Does not report any nausea, vomiting or abdominal pain.  Does not report any constipation or diarrhea.  Does not report any skeletal complaints.    Does not report frequency, urgency or hematuria.  Does not report any skin rashes or lesions. Does not report any heat or cold intolerance.  Does not report any lymphadenopathy or petechiae.  Does not report any anxiety or depression.  Remaining review of systems is negative.     Past Medical History:  Diagnosis Date   Anemia    Cancer ([place])    colon cancer 90s   Diabetes mellitus without complication ([place])    Hypertension    Stroke [facility])    TIA  :   Past Surgical History:  Procedure Laterality Date   APPENDECTOMY     COLONOSCOPY WITH PROPOFOL N/A 03/31/2021   Procedure: COLONOSCOPY WITH PROPOFOL;  Surgeon: Eloise Harman, DO;  [facility];  Service: Endoscopy;  Laterality: N/A;  10:00 / ASA 3   CYSTOSCOPY WITH RETROGRADE PYELOGRAM, URETEROSCOPY AND STENT PLACEMENT Left 10/22/2021   Procedure: CYSTOSCOPY WITH RETROGRADE PYELOGRAM, URETEROSCOPY AND STENT PLACEMENT;  Surgeon: Primus Bravo., MD;  Location: [facility];  Service: Urology;   Laterality: Left;   HOLMIUM LASER APPLICATION Left 09/19/5629   Procedure: HOLMIUM LASER APPLICATION;  Surgeon: Primus Bravo., MD;  Location: [facility];  Service: Urology;  Laterality: Left;   PARTIAL COLECTOMY  1990   POLYPECTOMY  03/31/2021   Procedure: POLYPECTOMY;  Surgeon: Eloise Harman, DO;  Location[facility];  Service: Endoscopy;;   PROSTATE BIOPSY N/A 10/22/2021   Procedure: PROSTATE BIOPSY;  Surgeon: Primus Bravo., MD;  Location: [facility];  Service: Urology;  Laterality: N/A;  :   Current Outpatient Medications:    aspirin EC 81 MG tablet, Take 81 mg by mouth in the morning. Swallow whole., Disp: , Rfl:    atorvastatin (LIPITOR) 40 MG tablet, Take 1 tablet (40 mg total) by mouth every evening. For stroke prevention, Disp: 30 tablet, Rfl: 11   Cholecalciferol ([employer] VIT D 5000 HIGH-POTENCY PO), Take 5,000 Units by mouth in the morning., Disp: , Rfl:    DULoxetine (CYMBALTA) 60 MG capsule, Take 60 mg by mouth at bedtime., Disp: , Rfl:    ferrous sulfate 325 (65 FE) MG tablet, Take 325 mg by mouth in the morning and at bedtime., Disp: , Rfl:    HYDROcodone-acetaminophen (NORCO/VICODIN) 5-325 MG tablet, Take 1 tablet by mouth every 6 (six) hours as needed for severe pain., Disp: 10 tablet, Rfl: 0   latanoprost (XALATAN) 0.005 % ophthalmic solution, Place 1 drop into  both eyes at bedtime., Disp: , Rfl:    metoprolol succinate (TOPROL XL) 25 MG 24 hr tablet, Take 1 tablet (25 mg total) by mouth daily. (Patient taking differently: Take 12.5 mg by mouth in the morning.), Disp: 30 tablet, Rfl: 11   phenazopyridine (PYRIDIUM) 200 MG tablet, Take 1 tablet (200 mg total) by mouth 3 (three) times daily as needed (pain with urination)., Disp: 30 tablet, Rfl: 0   senna-docusate (SENOKOT-S) 8.6-50 MG tablet, Take 2 tablets by mouth at bedtime. (Patient taking differently: Take 2 tablets by mouth daily as needed (constipation.).), Disp: 60 tablet, Rfl: 3   sitaGLIPtin (JANUVIA) 100 MG  tablet, Take 1 tablet (100 mg total) by mouth daily. (Patient taking differently: Take 100 mg by mouth daily after lunch.), Disp: 30 tablet, Rfl: 11:  No Known Allergies:  No family history on file.:   Social History   Socioeconomic History   Marital status: Single    Spouse name: Not on file   Number of children: Not on file   Years of education: Not on file   Highest education level: Not on file  Occupational History   Not on file  Tobacco Use   Smoking status: Former   Smokeless tobacco: Never  Vaping Use   Vaping Use: Never used  Substance and Sexual Activity   Alcohol use: Yes    Comment: occ   Drug use: Not Currently   Sexual activity: Not on file  Other Topics Concern   Not on file  Social History Narrative   Not on file   Social Determinants of Health   Financial Resource Strain: Not on file  Food Insecurity: Not on file  Transportation Needs: Not on file  Physical Activity: Not on file  Stress: Not on file  Social Connections: Not on file  Intimate Partner Violence: Not on file  :  Pertinent items are noted in HPI.  Exam: There were no vitals taken for this visit.  General appearance: alert and cooperative appeared without distress. Head: atraumatic without any abnormalities. Eyes: conjunctivae/corneas clear. PERRL.  Sclera anicteric. Throat: lips, mucosa, and tongue normal; without oral thrush or ulcers. Resp: clear to auscultation bilaterally without rhonchi, wheezes or dullness to percussion. Cardio: regular rate and rhythm, S1, S2 normal, no murmur, click, rub or gallop GI: soft, non-tender; bowel sounds normal; no masses,  no organomegaly Skin: Skin color, texture, turgor normal. No rashes or lesions Lymph nodes: Cervical, supraclavicular, and axillary nodes normal. Neurologic: Grossly normal without any motor, sensory or deep tendon reflexes. Musculoskeletal: No joint deformity or effusion.   DG C-Arm 1-60 Min-No Report  Result Date:  10/22/2021 Fluoroscopy was utilized by the requesting physician.  No radiographic interpretation.   US Guided Needle Placement  Result Date: 10/22/2021 CLINICAL DATA:  Ultrasound was provided for use by the ordering physician.  No provider Interpretation or professional fees incurred.     Assessment and Plan:   66 year old with prostate cancer diagnosed in September 2023.  He was found to have a Gleason score 3+4 =7 although the pattern 4 component involves 5 to 10% with an elevated PSA of 15.  Treatment options at this time were discussed as a part of the prostate cancer multidisciplinary clinic.  Primary surgical therapy versus radiation therapy were debated.  The role for active surveillance was also discussed given his comorbidities and low volume disease.  The rise in the PSA is concerning and if he opts for active surveillance and repeat PSA and MRI will  be necessary at this point.  The role for systemic therapy options were discussed currently will be deferred given his early stage disease.  These options including systemic chemotherapy, androgen deprivation  After discussion today he will think about these options and make a decision in the near future.  Is leaning towards surgical option at this time.  30  minutes were dedicated to this visit. The time was spent on reviewing pathology results, discussing treatment options,  and answering questions regarding future plan.    A copy of this consult has been forwarded to the requesting physician.

## 2021-11-16 DIAGNOSIS — Z191 Hormone sensitive malignancy status: Secondary | ICD-10-CM | POA: Diagnosis not present

## 2021-11-18 DIAGNOSIS — E119 Type 2 diabetes mellitus without complications: Secondary | ICD-10-CM | POA: Diagnosis not present

## 2021-11-18 DIAGNOSIS — E785 Hyperlipidemia, unspecified: Secondary | ICD-10-CM | POA: Diagnosis not present

## 2021-11-18 DIAGNOSIS — E079 Disorder of thyroid, unspecified: Secondary | ICD-10-CM | POA: Diagnosis not present

## 2021-11-25 ENCOUNTER — Ambulatory Visit: Payer: Medicare Other | Admitting: Physician Assistant

## 2021-11-25 DIAGNOSIS — E079 Disorder of thyroid, unspecified: Secondary | ICD-10-CM | POA: Diagnosis not present

## 2021-11-25 DIAGNOSIS — H409 Unspecified glaucoma: Secondary | ICD-10-CM | POA: Diagnosis not present

## 2021-11-25 DIAGNOSIS — Z23 Encounter for immunization: Secondary | ICD-10-CM | POA: Diagnosis not present

## 2021-11-25 DIAGNOSIS — E119 Type 2 diabetes mellitus without complications: Secondary | ICD-10-CM | POA: Diagnosis not present

## 2021-11-25 DIAGNOSIS — E785 Hyperlipidemia, unspecified: Secondary | ICD-10-CM | POA: Diagnosis not present

## 2021-11-25 DIAGNOSIS — N1832 Chronic kidney disease, stage 3b: Secondary | ICD-10-CM | POA: Diagnosis not present

## 2021-11-25 DIAGNOSIS — D649 Anemia, unspecified: Secondary | ICD-10-CM | POA: Diagnosis not present

## 2021-11-25 DIAGNOSIS — R945 Abnormal results of liver function studies: Secondary | ICD-10-CM | POA: Diagnosis not present

## 2021-11-25 DIAGNOSIS — I10 Essential (primary) hypertension: Secondary | ICD-10-CM | POA: Diagnosis not present

## 2021-11-25 DIAGNOSIS — K769 Liver disease, unspecified: Secondary | ICD-10-CM | POA: Diagnosis not present

## 2021-11-25 DIAGNOSIS — F32A Depression, unspecified: Secondary | ICD-10-CM | POA: Diagnosis not present

## 2021-11-27 ENCOUNTER — Ambulatory Visit (HOSPITAL_COMMUNITY)
Admission: RE | Admit: 2021-11-27 | Discharge: 2021-11-27 | Disposition: A | Discharge status: Home / Self Care | Payer: Medicare Other | Source: Ambulatory Visit | Attending: Urology | Admitting: Urology

## 2021-11-27 DIAGNOSIS — Z87442 Personal history of urinary calculi: Secondary | ICD-10-CM | POA: Diagnosis not present

## 2021-11-27 DIAGNOSIS — Z9889 Other specified postprocedural states: Secondary | ICD-10-CM | POA: Diagnosis not present

## 2021-11-27 DIAGNOSIS — N201 Calculus of ureter: Secondary | ICD-10-CM | POA: Insufficient documentation

## 2021-11-27 DIAGNOSIS — N281 Cyst of kidney, acquired: Secondary | ICD-10-CM | POA: Diagnosis not present

## 2021-11-28 ENCOUNTER — Telehealth: Payer: Self-pay | Admitting: Genetic Counselor

## 2021-11-28 ENCOUNTER — Encounter: Payer: Self-pay | Admitting: Genetic Counselor

## 2021-11-28 ENCOUNTER — Ambulatory Visit: Payer: Self-pay | Admitting: Genetic Counselor

## 2021-11-28 DIAGNOSIS — Z1379 Encounter for other screening for genetic and chromosomal anomalies: Secondary | ICD-10-CM | POA: Insufficient documentation

## 2021-11-28 NOTE — Telephone Encounter (Signed)
I contacted Mr. Cardenas to discuss his genetic testing results. No pathogenic variants were identified in the 36 genes analyzed. Of note, a variant of uncertain significance was identified in the BRCA2 gene. Detailed clinic note to follow.  The test report has been scanned into EPIC and is located under the Molecular Pathology section of the Results Review tab.  A portion of the result report is included below for reference.   Lucille Passy, MS, [facility] Genetic Counselor [email or web address] (P) [phone]

## 2021-11-28 NOTE — Progress Notes (Signed)
HPI:   Mr. Glasscock was previously seen in the [place] clinic due to a personal and family history of cancer and concerns regarding a hereditary predisposition to cancer. Please refer to our prior cancer genetics clinic note for more information regarding our discussion, assessment and recommendations, at the time. Mr. Kolton recent genetic test results were disclosed to him, as were recommendations warranted by these results. These results and recommendations are discussed in more detail below.  CANCER HISTORY:  Oncology History  Prostate cancer ([place])  10/22/2021 Cancer Staging   Staging form: Prostate, AJCC 8th Edition - Clinical stage from 10/22/2021: Stage IIB (cT1c, cN0, cM0, PSA: 15.3, Grade Group: 2) - Signed by [name], PA-C on 11/14/2021 Histopathologic type: Adenocarcinoma, NOS Stage prefix: Initial diagnosis Prostate specific antigen (PSA) range: 10 to 19 Gleason primary pattern: 3 Gleason secondary pattern: 4 Gleason score: 7 Histologic grading system: 5 grade system Number of biopsy cores examined: 12 Number of biopsy cores positive: 2 Location of positive needle core biopsies: Both sides   11/14/2021 Initial Diagnosis   Prostate cancer ([place])     FAMILY HISTORY:  We obtained a detailed, 4-generation family history.  Significant diagnoses are listed below:      Family History  Problem Relation Age of Onset   Colon cancer Mother 28 - 44   Colon cancer Cousin         Mr. Sitter mother was diagnosed with colon cancer in her 18s, she died at age 40. One maternal first cousin was diagnosed with colon cancer at an unknown age. Mr. Morua is unaware of previous family history of genetic testing for hereditary cancer risks. There is no reported Ashkenazi Jewish ancestry.   GENETIC TEST RESULTS:  The [facility] CancerNext Panel found no pathogenic mutations.   The CancerNext gene panel offered by [employer] includes sequencing, rearrangement  analysis, and RNA analysis for the following 36 genes:   APC, ATM, AXIN2, BARD1, BMPR1A, BRCA1, BRCA2, BRIP1, CDH1, CDK4, CDKN2A, CHEK2, DICER1, HOXB13, EPCAM, GREM1, MLH1, MSH2, MSH3, MSH6, MUTYH, NBN, NF1, NTHL1, PALB2, PMS2, POLD1, POLE, PTEN, RAD51C, RAD51D, RECQL, SMAD4, SMARCA4, STK11, and TP53.   The test report has been scanned into EPIC and is located under the Molecular Pathology section of the Results Review tab.  A portion of the result report is included below for reference. Genetic testing reported out on 11/24/2021.       Genetic testing identified a variant of uncertain significance (VUS) in the BRCA2 gene called [id number].  At this time, it is unknown if this variant is associated with an increased risk for cancer or if it is benign, but most uncertain variants are reclassified to benign. It should not be used to make medical management decisions. With time, we suspect the laboratory will determine the significance of this variant, if any. If the laboratory reclassifies this variant, we will attempt to contact Mr. Rands to discuss it further. We do not recommend familial testing for the BRCA2 variant of uncertain significance (VUS).  Even though a pathogenic variant was not identified, possible explanations for the cancer in the family may include: There may be no hereditary risk for cancer in the family. The cancers in Mr. Eguia and/or his family may be due to other genetic or environmental factors. There may be a gene mutation in one of these genes that current testing methods cannot detect, but that chance is small. There could be another gene that has not yet been discovered, or that  we have not yet tested, that is responsible for the cancer diagnoses in the family.   Therefore, it is important to remain in touch with cancer genetics in the future so that we can continue to offer Mr. Carignan the most up to date genetic testing.   ADDITIONAL GENETIC TESTING:  We discussed with  Mr. Hagos that his genetic testing was fairly extensive.  If there are genes identified to increase cancer risk that can be analyzed in the future, we would be happy to discuss and coordinate this testing at that time.    CANCER SCREENING RECOMMENDATIONS:  Mr. Bollman test result is considered negative (normal).  This means that we have not identified a hereditary cause for his personal and family history of cancer at this time.   An individual's cancer risk and medical management are not determined by genetic test results alone. Overall cancer risk assessment incorporates additional factors, including personal medical history, family history, and any available genetic information that may result in a personalized plan for cancer prevention and surveillance. Therefore, it is recommended he continue to follow the cancer management and screening guidelines provided by his oncology and primary healthcare provider.  FOLLOW-UP:  Cancer genetics is a rapidly advancing field and it is possible that new genetic tests will be appropriate for him and/or his family members in the future. We encouraged him to remain in contact with cancer genetics on an annual basis so we can update his personal and family histories and let him know of advances in cancer genetics that may benefit this family.   Our contact number was provided. Mr. Tuzzolino questions were answered to his satisfaction, and he knows he is welcome to call us at anytime with additional questions or concerns.   Lucille Passy, MS, [facility] Genetic Counselor [email or web address] (P) [phone]

## 2021-12-01 ENCOUNTER — Encounter: Payer: Self-pay | Admitting: Genetic Counselor

## 2021-12-01 DIAGNOSIS — Z0100 Encounter for examination of eyes and vision without abnormal findings: Secondary | ICD-10-CM | POA: Diagnosis not present

## 2021-12-02 DIAGNOSIS — H40003 Preglaucoma, unspecified, bilateral: Secondary | ICD-10-CM | POA: Diagnosis not present

## 2021-12-02 DIAGNOSIS — H468 Other optic neuritis: Secondary | ICD-10-CM | POA: Diagnosis not present

## 2021-12-02 DIAGNOSIS — H2513 Age-related nuclear cataract, bilateral: Secondary | ICD-10-CM | POA: Diagnosis not present

## 2021-12-03 DIAGNOSIS — E113512 Type 2 diabetes mellitus with proliferative diabetic retinopathy with macular edema, left eye: Secondary | ICD-10-CM | POA: Diagnosis not present

## 2021-12-04 ENCOUNTER — Ambulatory Visit: Payer: Medicare Other | Admitting: Physician Assistant

## 2021-12-04 ENCOUNTER — Ambulatory Visit (HOSPITAL_COMMUNITY)
Admission: RE | Admit: 2021-12-04 | Discharge: 2021-12-04 | Disposition: A | Discharge status: Home / Self Care | Payer: Medicare Other | Source: Ambulatory Visit | Attending: Physician Assistant | Admitting: Physician Assistant

## 2021-12-04 VITALS — BP 98/67 | HR 99 | Wt 147.0 lb

## 2021-12-04 DIAGNOSIS — N200 Calculus of kidney: Secondary | ICD-10-CM | POA: Insufficient documentation

## 2021-12-04 DIAGNOSIS — Z8546 Personal history of malignant neoplasm of prostate: Secondary | ICD-10-CM

## 2021-12-04 DIAGNOSIS — N201 Calculus of ureter: Secondary | ICD-10-CM

## 2021-12-04 DIAGNOSIS — C61 Malignant neoplasm of prostate: Secondary | ICD-10-CM

## 2021-12-04 DIAGNOSIS — N202 Calculus of kidney with calculus of ureter: Secondary | ICD-10-CM

## 2021-12-04 LAB — MICROSCOPIC EXAMINATION

## 2021-12-04 LAB — URINALYSIS, ROUTINE W REFLEX MICROSCOPIC
Bilirubin, UA: NEGATIVE
Glucose, UA: NEGATIVE
Ketones, UA: NEGATIVE
Leukocytes,UA: NEGATIVE
Nitrite, UA: NEGATIVE
Protein,UA: NEGATIVE
Specific Gravity, UA: 1.01 (ref 1.005–1.030)
Urobilinogen, Ur: 0.2 mg/dL (ref 0.2–1.0)
pH, UA: 5 (ref 5.0–7.5)

## 2021-12-04 NOTE — Progress Notes (Signed)
Assessment: 1. Left ureteral stone - Urinalysis, Routine w reflex microscopic  2. Renal stones - DG Abd 1 View; Future  3. Prostate cancer [facility])    Plan: Stone prevention again discussed. Maintain adequate hydration. FU in 6 months with Dr. Jeffie Pollock after KUB. Call or return if pt has concerns prior to next OV.   Chief Complaint: No chief complaint on file.   HPI: Alec Bruce is a 66 y.o. male who presents for post op evaluation of EWSL left ureteral calculus and stent placement with prostate bx performed on 10/22/21. Pt dx with prostate CA and is now followed by Dr. Alinda Money at [place] for Gleason 3+4=7 adenocarcinoma with 2 out of 12 biopsy cores positive for malignancy. Pt denies dysuria, gross hematuria. No stone particle passage. Minimal LUTs   UA=clear KUB- Pt did not get KUB before OV Renal US on 11/27/21 No hydro or obvious stones  10/30/21 Subjective: 1. Left ureteral stone   2. Prostate cancer ([place])       10/30/21: Alec Bruce returns today in f/u from his recent left ureteroscopy and prostate biopsy.   He has some hypotension since he got started on the tamsulosin.  He has had no fever or flank pain.  His biopsy showed a 24m prostate with GG1 in 10% of the RAL core and GG2 in 5% of the LAL core.  His prebiopsy PSA was 15.  [facility] predicts 53% OCD, 44% ECE, 5% LNI and 4% SVI.  He doesn't need staging studies.  He has mild LUTS but severe ED.    10/02/21: [place] today in f/u for his history of an elevated PSA and microhematuria with history of stones.  He had a PSA on 8/3 that was up further to 15.3 and had a CT stone study on 09/18/21 that showed a 964mleft UVJ stone without obstruction. The stone was in the [facility] 2022.  He has no flank pain or gross hematuria.  He has no urgency.    He has some increased frequency but has increased his fluid intake.    09/04/21: NaCamaras a [age]o male who is sent for a recent PSA elevation of 10. He has not had a  rectal exam.  He has pass a couple of kidney stones but has no other GU history. He has a reduced stream with some intermittency and post void dribbling.  He has had no hematuria or dysuria. He has had no other recent acute ailments.  He has ED.   He has DM with neuropathy and CKD3b as well as a history of colon cancer with a partial colectomy in the 1990s.  He has 11-30 RBC's on UA today.  He had a CT stone study in 6/22 that showed non-obstructing renal stones.     Portions of the above documentation were copied from a prior visit for review purposes only.  Allergies: No Known Allergies  PMH: Past Medical History:  Diagnosis Date   Anemia    [facility] [place]   [facility]   Di[place] mellitus without complication ([facility]   Hypertension    [place]   TIA    PSH: Past Surgical History:  Procedure Laterality Date   APPENDECTOMY     COLONOSCOPY WITH PROPOFOL N/A 03/31/2021   Procedure: COLONOSCOPY WITH PROPOFOL;  Surgeon: CaEloise HarmanDO;  Location: [facility];  Service: Endoscopy;  Laterality: N/A;  10:00 / ASA 3   CYSTOSCOPY WITH RETROGRADE PYELOGRAM,  URETEROSCOPY AND STENT PLACEMENT Left 10/22/2021   Procedure: CYSTOSCOPY WITH RETROGRADE PYELOGRAM, URETEROSCOPY AND STENT PLACEMENT;  Surgeon: Primus Bravo., MD;  Location: [facility] ORS;  Service: Urology;  Laterality: Left;   HOLMIUM LASER APPLICATION Left 2/92/4462   Procedure: HOLMIUM LASER APPLICATION;  Surgeon: Primus Bravo., MD;  Location: [facility] ORS;  Service: Urology;  Laterality: Left;   PARTIAL COLECTOMY  1990   POLYPECTOMY  03/31/2021   Procedure: POLYPECTOMY;  Surgeon: Eloise Harman, DO;  Location: [facility];  Service: Endoscopy;;   PROSTATE BIOPSY N/A 10/22/2021   Procedure: PROSTATE BIOPSY;  Surgeon: Primus Bravo., MD;  Location: [facility] ORS;  Service: Urology;  Laterality: N/A;    SH: Social History   Tobacco Use   Smoking status: Former   Smokeless tobacco: Never  Brewing technologist Use: Never used  Substance Use Topics   Alcohol use: Yes    Comment: occ   Drug use: Not Currently    ROS: All other review of systems were reviewed and are negative except what is noted above in HPI  PE: BP 98/67   Pulse 99   Wt 147 lb (66.7 kg)   BMI 19.94 kg/m  GENERAL APPEARANCE:  Well appearing, well developed, NAD HEENT:  Atraumatic, normocephalic NECK:  Supple. Trachea midline ABDOMEN:  Soft, non-tender, no masses, no CVAT EXTREMITIES:  Moves all extremities well NEUROLOGIC:  Alert and oriented x 3 MENTAL STATUS:  appropriate SKIN:  Warm, dry, and intact   Results: Laboratory Data: Lab Results  Component Value Date   WBC 5.2 10/21/2021   HGB 11.3 (L) 10/21/2021   HCT 35.4 (L) 10/21/2021   MCV 94.1 10/21/2021   PLT 193 10/21/2021    Lab Results  Component Value Date   CREATININE 1.96 (H) 09/04/2021    No results found for: "PSA"  Lab Results  Component Value Date   TESTOSTERONE 429 09/04/2021    Lab Results  Component Value Date   HGBA1C 6.5 (H) 10/21/2021    Urinalysis    Component Value Date/Time   COLORURINE YELLOW 08/01/2020 1427   APPEARANCEUR Clear 10/02/2021 1155   LABSPEC 1.016 08/01/2020 1427   PHURINE 5.0 08/01/2020 1427   GLUCOSEU Negative 10/02/2021 1155   HGBUR NEGATIVE 08/01/2020 1427   BILIRUBINUR Negative 10/02/2021 1155   KETONESUR 20 (A) 08/01/2020 1427   PROTEINUR Negative 10/02/2021 1155   PROTEINUR NEGATIVE 08/01/2020 1427   UROBILINOGEN 0.2 10/31/2007 0700   NITRITE Negative 10/02/2021 1155   NITRITE NEGATIVE 08/01/2020 1427   LEUKOCYTESUR Negative 10/02/2021 1155   LEUKOCYTESUR NEGATIVE 08/01/2020 1427    Lab Results  Component Value Date   LABMICR See below: 10/02/2021   WBCUA None seen 10/02/2021   LABEPIT None seen 10/02/2021   MUCUS Present 10/02/2021   BACTERIA None seen 10/02/2021    Pertinent Imaging: No results found for this or any previous visit.  No results found for this or any  previous visit.  No results found for this or any previous visit.  No results found for this or any previous visit.  Results for orders placed during the hospital encounter of 11/27/21  US RENAL  Narrative CLINICAL DATA:  Ureteral calculus LEFT, follow-up, post lithotripsy  EXAM: RENAL / URINARY TRACT ULTRASOUND COMPLETE  COMPARISON:  CT abdomen and pelvis 09/18/2021  FINDINGS: Right Kidney:  Renal measurements: 9.1 x 4.5 x 5.4 cm = volume: 117 mL. Normal cortical thickness. Increased cortical echogenicity. No mass, hydronephrosis, or shadowing calcification.  Left Kidney:  Renal measurements: 10.4 x 5.5 x 5.1 cm = volume: 154 mL. Normal cortical thickness. Increased cortical echogenicity. Cyst identified inferior pole simple features 2.2 x 2.3 x 2.5 cm; no follow-up imaging recommended. No additional cyst or mass identified. No hydronephrosis or urinary tract calcification.  Bladder:  Appears normal for degree of bladder distention. BILATERAL ureteral jets noted.  Other:  None.  IMPRESSION: Medical renal disease changes of both kidneys.  No evidence of renal mass or hydronephrosis.  2.5 cm simple cyst LEFT kidney; no follow-up imaging recommended.   Electronically Signed By: Lavonia Dana M.D. On: 11/27/2021 18:59  No valid procedures specified. No results found for this or any previous visit.  Results for orders placed during the hospital encounter of 09/18/21  CT RENAL STONE STUDY  Narrative CLINICAL DATA:  Microhematuria.  EXAM: CT ABDOMEN AND PELVIS WITHOUT CONTRAST  TECHNIQUE: Multidetector CT imaging of the abdomen and pelvis was performed following the standard protocol without IV contrast.  RADIATION DOSE REDUCTION: This exam was performed according to the departmental dose-optimization program which includes automated exposure control, adjustment of the mA and/or kV according to patient size and/or use of iterative reconstruction  technique.  COMPARISON:  08/01/2020  FINDINGS: Lower chest: Unremarkable.  Hepatobiliary: No suspicious focal abnormality in the liver on this study without intravenous contrast. Small calcified gallstone evident. No intrahepatic or extrahepatic biliary dilation.  Pancreas: No focal mass lesion. No dilatation of the main duct. No intraparenchymal cyst. No peripancreatic edema.  Spleen: No splenomegaly. No focal mass lesion.  Adrenals/Urinary Tract: No adrenal nodule or mass. Punctate stone noted upper pole right kidney on 20/2. 3 mm interpolar right renal stone evident. No right ureteral stone.  Small cluster of tiny stones noted lower pole left kidney on 30/2 with a punctate nonobstructing interpolar left stone on 27/2. 2.0 cm cyst noted lower pole left kidney. Despite the lack of secondary changes in the left kidney and ureter, a 9 x 7 x 7 mm stone is identified in the left UVJ (image 67/2).  Stomach/Bowel: Stomach is unremarkable. No gastric wall thickening. No evidence of outlet obstruction. Duodenum is normally positioned as is the ligament of Treitz. No small bowel wall thickening. No small bowel dilatation. Status post partial colectomy with reanastomosis.  Vascular/Lymphatic: There is moderate atherosclerotic calcification of the abdominal aorta without aneurysm. There is no gastrohepatic or hepatoduodenal ligament lymphadenopathy. No retroperitoneal or mesenteric lymphadenopathy. No pelvic sidewall lymphadenopathy.  Reproductive: The prostate gland and seminal vesicles are unremarkable.  Other: No intraperitoneal free fluid.  Musculoskeletal: No worrisome lytic or sclerotic osseous abnormality.  IMPRESSION: 1. 9 x 7 x 7 mm left UVJ stone without substantial secondary changes in the left kidney or ureter. 2. Bilateral nonobstructing nephrolithiasis. 3. Cholelithiasis. 4. Aortic Atherosclerosis (ICD10-I70.0).   Electronically Signed By: Misty Stanley  M.D. On: 09/19/2021 11:42  No results found for this or any previous visit (from the past 24 hour(s)).

## 2021-12-16 DIAGNOSIS — Z23 Encounter for immunization: Secondary | ICD-10-CM | POA: Diagnosis not present

## 2021-12-23 ENCOUNTER — Telehealth: Payer: Self-pay

## 2021-12-23 DIAGNOSIS — N189 Chronic kidney disease, unspecified: Secondary | ICD-10-CM | POA: Diagnosis not present

## 2021-12-23 DIAGNOSIS — D638 Anemia in other chronic diseases classified elsewhere: Secondary | ICD-10-CM | POA: Diagnosis not present

## 2021-12-23 DIAGNOSIS — E1129 Type 2 diabetes mellitus with other diabetic kidney complication: Secondary | ICD-10-CM | POA: Diagnosis not present

## 2021-12-23 DIAGNOSIS — E1122 Type 2 diabetes mellitus with diabetic chronic kidney disease: Secondary | ICD-10-CM | POA: Diagnosis not present

## 2021-12-23 DIAGNOSIS — R809 Proteinuria, unspecified: Secondary | ICD-10-CM | POA: Diagnosis not present

## 2021-12-23 NOTE — Telephone Encounter (Signed)
-----   Message from [place], [place] sent at 12/23/2021 10:00 AM EST ----- Please let the patient know that his KUB indicated small stone on the right which was seen in the past.  We will keep an eye on the stone with follow-up scheduled with Dr. Jeffie Pollock.  The stone on the left which was treated is no longer visible on the x-rays. ----- Message ----- From: Interface, Rad Results In Sent: 12/07/2021   8:40 AM EST To: Berneice Heinrich [place], PA-C

## 2021-12-23 NOTE — Telephone Encounter (Signed)
Made patient aware that his KUB showed a small stone on the right that was seen previously in the past and we will keep as eye on the stone with a follow up with Dr. Jeffie Pollock. Made patient aware that the stone on the left that was treated is no longer visible on the X-rays. Patient voiced he has appt on 11/14 with Dr. Jeffie Pollock, patient voiced understanding.

## 2022-01-07 ENCOUNTER — Telehealth: Payer: Self-pay

## 2022-01-07 NOTE — Telephone Encounter (Signed)
Spoke with patient to follow up from Prostate Clinic to see which way he was leaning as far as treatment, patient advised he has an appointment with Dr. Jeffie Pollock on the 14th but was thinking possibly radiation and hoping to do it in [place], he would have a better answer after seeing Dr. Jeffie Pollock.

## 2022-01-15 ENCOUNTER — Ambulatory Visit (INDEPENDENT_AMBULATORY_CARE_PROVIDER_SITE_OTHER): Payer: Medicare Other | Admitting: Urology

## 2022-01-15 ENCOUNTER — Encounter: Payer: Self-pay | Admitting: Urology

## 2022-01-15 ENCOUNTER — Telehealth: Payer: Self-pay

## 2022-01-15 VITALS — BP 152/78 | HR 92

## 2022-01-15 DIAGNOSIS — C61 Malignant neoplasm of prostate: Secondary | ICD-10-CM

## 2022-01-15 DIAGNOSIS — N201 Calculus of ureter: Secondary | ICD-10-CM | POA: Diagnosis not present

## 2022-01-15 DIAGNOSIS — Z87442 Personal history of urinary calculi: Secondary | ICD-10-CM

## 2022-01-15 DIAGNOSIS — N200 Calculus of kidney: Secondary | ICD-10-CM

## 2022-01-15 LAB — URINALYSIS, ROUTINE W REFLEX MICROSCOPIC
Bilirubin, UA: NEGATIVE
Glucose, UA: NEGATIVE
Ketones, UA: NEGATIVE
Leukocytes,UA: NEGATIVE
Nitrite, UA: NEGATIVE
Protein,UA: NEGATIVE
Specific Gravity, UA: 1.01 (ref 1.005–1.030)
Urobilinogen, Ur: 0.2 mg/dL (ref 0.2–1.0)
pH, UA: 5 (ref 5.0–7.5)

## 2022-01-15 LAB — MICROSCOPIC EXAMINATION
Bacteria, UA: NONE SEEN
WBC, UA: NONE SEEN /hpf (ref 0–5)

## 2022-01-15 NOTE — Progress Notes (Signed)
Subjective: 1. Left ureteral stone   2. Renal stones   3. Prostate cancer ([place])      01/15/22: Alec Bruce returns today in f/u.  He had a KUB on 12/04/21 following left ureteroscopy on 10/22/21 and only had a small right renal stone. A renal US on 11/27/21 showed on hydro and only a 2.5cm left renal cyst.  He was seen in the [facility] for his recent prostate cancer and he is interested in pursuing a seed implant.    He has no flank pain or hematuria and his IPSS is 1. UA today has 3-10 RBC's.   10/30/21: Alec Bruce returns today in f/u from his recent left ureteroscopy and prostate biopsy.   He has some hypotension since he got started on the tamsulosin.  He has had no fever or flank pain.  His biopsy showed a 62m prostate with GG1 in 10% of the RAL core and GG2 in 5% of the LAL core.  His prebiopsy PSA was 15.  [facility] 53[facility] OCD, 44% ECE, 5% LNI and 4% SVI.  He doesn't need staging studies.  He has mild LUTS but severe ED.   10/02/21: [name] in f/u for his history of an elevated PSA and microhematuria with history of stones.  He had a PSA on 8/3 that was up further to 15.3 and had a CT stone study on 09/18/21 that showed a 975mleft UVJ stone without obstruction. The stone was in the [place] 20[place].  He has no flank pain or gross hematuria.  He has no urgency.    He has some increased frequency but has increased his fluid intake.   09/04/21: Alec Bruce a [age]o male who is sent for a recent PSA elevation of 10. He has not had a rectal exam.  He has pass a couple of kidney stones but has no other GU history. He has a reduced stream with some intermittency and post void dribbling.  He has had no hematuria or dysuria. He has had no other recent acute ailments.  He has ED.   He has DM with neuropathy and CKD3b as well as a history of colon cancer with a partial colectomy in the 1990s.  He has 11-30 RBC's on UA today.  He had a CT stone study in 6/22 that showed non-obstructing renal  stones.   ROS:  Review of Systems  All other systems reviewed and are negative.   No Known Allergies  Past Medical History:  Diagnosis Date   Anemia    Cancer ([facility]   colon cancer 90s   Diabetes mellitus without complication ([facility]   Hypertension    [facility]   [facility]    Past Surgical History:  Procedure Laterality Date   APPENDECTOMY     COLONOSCOPY WITH PROPOFOL N/A 03/31/2021   Procedure: COLONOSCOPY WITH PROPOFOL;  Surgeon: CaEloise HarmanDO;  Location: [facility] ENDO SUITE;  Service: Endoscopy;  Laterality: N/A;  10:00 / ASA 3   CYSTOSCOPY WITH RETROGRADE PYELOGRAM, URETEROSCOPY AND STENT PLACEMENT Left 10/22/2021   Procedure: CYSTOSCOPY WITH RETROGRADE PYELOGRAM, URETEROSCOPY AND STENT PLACEMENT;  Surgeon: StPrimus Bravo MD;  Location: [facility] ORS;  Service: Urology;  Laterality: Left;   HOLMIUM LASER APPLICATION Left 10/09/80/9562 Procedure: HOLMIUM LASER APPLICATION;  Surgeon: StPrimus Bravo MD;  Location: [facility] ORS;  Service: Urology;  Laterality: Left;   PARTIAL COLECTOMY  1990   POLYPECTOMY  03/31/2021   Procedure: POLYPECTOMY;  Surgeon: Eloise Harman, DO;  Location: [facility] ENDO SUITE;  Service: Endoscopy;;   PROSTATE BIOPSY N/A 10/22/2021   Procedure: PROSTATE BIOPSY;  Surgeon: Primus Bravo., MD;  Location: [facility] ORS;  Service: Urology;  Laterality: N/A;    Social History   Socioeconomic History   Marital status: Single    Spouse name: Not on file   Number of children: Not on file   Years of education: Not on file   Highest education level: Not on file  Occupational History   Not on file  Tobacco Use   Smoking status: Former   Smokeless tobacco: Never  Vaping Use   Vaping Use: Never used  Substance and Sexual Activity   Alcohol use: Yes    Comment: occ   Drug use: Not Currently   Sexual activity: Not on file  Other Topics Concern   Not on file  Social History Narrative   Not on file   Social Determinants of Health   Financial  Resource Strain: Not on file  Food Insecurity: Not on file  Transportation Needs: Not on file  Physical Activity: Not on file  Stress: Not on file  Social Connections: Not on file  Intimate Partner Violence: Not on file    Family History  Problem Relation Age of Onset   Colon cancer Mother 48 - 32   Colon cancer Cousin     Anti-infectives: Anti-infectives (From admission, onward)    None       Current Outpatient Medications  Medication Sig Dispense Refill   aspirin EC 81 MG tablet Take 81 mg by mouth in the morning. Swallow whole.     atorvastatin (LIPITOR) 40 MG tablet Take 1 tablet (40 mg total) by mouth every evening. For stroke prevention 30 tablet 11   Cholecalciferol (CVS VIT D 5000 HIGH-POTENCY PO) Take 5,000 Units by mouth in the morning.     DULoxetine (CYMBALTA) 60 MG capsule Take 60 mg by mouth at bedtime.     ferrous sulfate 325 (65 FE) MG tablet Take 325 mg by mouth in the morning and at bedtime.     HYDROcodone-acetaminophen (NORCO/VICODIN) 5-325 MG tablet Take 1 tablet by mouth every 6 (six) hours as needed for severe pain. 10 tablet 0   latanoprost (XALATAN) 0.005 % ophthalmic solution Place 1 drop into both eyes at bedtime.     phenazopyridine (PYRIDIUM) 200 MG tablet Take 1 tablet (200 mg total) by mouth 3 (three) times daily as needed (pain with urination). 30 tablet 0   potassium citrate (UROCIT-K) 10 MEQ (1080 MG) SR tablet Take 10 mEq by mouth daily.     senna-docusate (SENOKOT-S) 8.6-50 MG tablet Take 2 tablets by mouth at bedtime. (Patient taking differently: Take 2 tablets by mouth daily as needed (constipation.).) 60 tablet 3   sitaGLIPtin (JANUVIA) 100 MG tablet Take 1 tablet (100 mg total) by mouth daily. (Patient taking differently: Take 100 mg by mouth daily after lunch.) 30 tablet 11   metoprolol succinate (TOPROL XL) 25 MG 24 hr tablet Take 1 tablet (25 mg total) by mouth daily. (Patient taking differently: Take 12.5 mg by mouth in the morning.) 30  tablet 11   No current facility-administered medications for this visit.     Objective: Vital signs in last 24 hours: BP (!) 152/78   Pulse 92   Intake/Output from previous day: No intake/output data recorded. Intake/Output this shift: '@IOTHISSHIFT'$ @   Physical Exam Vitals reviewed.  Constitutional:      Appearance:  Normal appearance.  Cardiovascular:     Rate and Rhythm: Normal rate and regular rhythm.     Heart sounds: Normal heart sounds.  Pulmonary:     Effort: Pulmonary effort is normal. No respiratory distress.     Breath sounds: Normal breath sounds.  Neurological:     Mental Status: He is alert.     Lab Results:  Results for orders placed or performed in visit on 01/15/22 (from the past 24 hour(s))  Urinalysis, Routine w reflex microscopic     Status: Abnormal   Collection Time: 01/15/22  8:51 AM  Result Value Ref Range   Specific Gravity, UA 1.010 1.005 - 1.030   pH, UA 5.0 5.0 - 7.5   Color, UA Yellow Yellow   Appearance Ur Clear Clear   Leukocytes,UA Negative Negative   Protein,UA Negative Negative/Trace   Glucose, UA Negative Negative   Ketones, UA Negative Negative   RBC, UA 2+ (A) Negative   Bilirubin, UA Negative Negative   Urobilinogen, Ur 0.2 0.2 - 1.0 mg/dL   Nitrite, UA Negative Negative   Microscopic Examination See below:    Narrative   Performed at:  [facility] [address] [place], [place]  [id number] Lab Director: Mina Marble MT, Phone:  [phone]  Microscopic Examination     Status: Abnormal   Collection Time: 01/15/22  8:51 AM   Urine  Result Value Ref Range   WBC, UA None seen 0 - 5 /hpf   RBC, Urine 3-10 (A) 0 - 2 /hpf   Epithelial Cells (non renal) 0-10 0 - 10 /hpf   Bacteria, UA None seen None seen/Few   Narrative   Performed at:  [facility], [place], [place]  [id number] Lab Director: [place], Phone:  [phone]     BMET No results for input(s): "NA", "K",  "CL", "CO2", "GLUCOSE", "BUN", "CREATININE", "CALCIUM" in the last 72 hours.  PT/INR No results for input(s): "LABPROT", "INR" in the last 72 hours. ABG No results for input(s): "PHART", "HCO3" in the last 72 hours.  Invalid input(s): "PCO2", "PO2" UA has 3-10 RBC's.  Studies/Results: No results found. DG Abd 1 View  Result Date: 12/07/2021 CLINICAL DATA:  Nephrolithiasis. EXAM: ABDOMEN - 1 VIEW COMPARISON:  August 01, 2020.  September 18, 2021. FINDINGS: The bowel gas pattern is normal. Small right renal calculus is noted. Left renal calculi are not clearly visualized. IMPRESSION: Small right renal calculus.  No abnormal bowel dilatation. Electronically Signed   By: Marijo Conception M.D.   On: 12/07/2021 08:38   US RENAL  Result Date: 11/27/2021 CLINICAL DATA:  Ureteral calculus LEFT, follow-up, post lithotripsy EXAM: RENAL / URINARY TRACT ULTRASOUND COMPLETE COMPARISON:  CT abdomen and pelvis 09/18/2021 FINDINGS: Right Kidney: Renal measurements: 9.1 x 4.5 x 5.4 cm = volume: 117 mL. Normal cortical thickness. Increased cortical echogenicity. No mass, hydronephrosis, or shadowing calcification. Left Kidney: Renal measurements: 10.4 x 5.5 x 5.1 cm = volume: 154 mL. Normal cortical thickness. Increased cortical echogenicity. Cyst identified inferior pole simple features 2.2 x 2.3 x 2.5 cm; no follow-up imaging recommended. No additional cyst or mass identified. No hydronephrosis or urinary tract calcification. Bladder: Appears normal for degree of bladder distention. BILATERAL ureteral jets noted. Other: None. IMPRESSION: Medical renal disease changes of both kidneys. No evidence of renal mass or hydronephrosis. 2.5 cm simple cyst LEFT kidney; no follow-up imaging recommended. Electronically Signed   By: Crist Infante.D.  On: 11/27/2021 18:59   DG C-Arm 1-60 Min-No Report  Result Date: 10/22/2021 Fluoroscopy was utilized by the requesting physician.  No radiographic interpretation.   US Guided  Needle Placement  Result Date: 10/22/2021 CLINICAL DATA:  Ultrasound was provided for use by the ordering physician.  No provider Interpretation or professional fees incurred.       Assessment/Plan:  Hx of left ureteral stone.   He is doing well s/p ureteroscopy with minimal microhematuria and a small right renal stone on f/u imaging.   Prostate cancer.   He has T1c Nx Mx GG2 intermediate risk prostate cancer. He has decided on a seed implant with SpaceOAR for management.  I reviewed the risks again with him and will get this scheduled.  No orders of the defined types were placed in this encounter.    Orders Placed This Encounter  Procedures   Microscopic Examination   Urinalysis, Routine w reflex microscopic     Return for I will get him scheduled for a seed implant in [place]..    CC: Dr. Celene Squibb.      Irine Seal 01/16/2022

## 2022-01-15 NOTE — Telephone Encounter (Signed)
Made patient aware he will need his xray done before appt. Today patient voiced that he is not driving, he voiced he can go get xray done but won't arrived on time for scheduled appt. Made patient aware to just come on in for appt. Patient voiced understanding

## 2022-01-29 DIAGNOSIS — E8722 Chronic metabolic acidosis: Secondary | ICD-10-CM | POA: Diagnosis not present

## 2022-01-29 DIAGNOSIS — N189 Chronic kidney disease, unspecified: Secondary | ICD-10-CM | POA: Diagnosis not present

## 2022-01-29 DIAGNOSIS — R809 Proteinuria, unspecified: Secondary | ICD-10-CM | POA: Diagnosis not present

## 2022-01-29 DIAGNOSIS — N200 Calculus of kidney: Secondary | ICD-10-CM | POA: Diagnosis not present

## 2022-01-29 DIAGNOSIS — I129 Hypertensive chronic kidney disease with stage 1 through stage 4 chronic kidney disease, or unspecified chronic kidney disease: Secondary | ICD-10-CM | POA: Diagnosis not present

## 2022-01-29 DIAGNOSIS — D638 Anemia in other chronic diseases classified elsewhere: Secondary | ICD-10-CM | POA: Diagnosis not present

## 2022-01-29 DIAGNOSIS — E1122 Type 2 diabetes mellitus with diabetic chronic kidney disease: Secondary | ICD-10-CM | POA: Diagnosis not present

## 2022-01-29 DIAGNOSIS — E1129 Type 2 diabetes mellitus with other diabetic kidney complication: Secondary | ICD-10-CM | POA: Diagnosis not present

## 2022-01-29 DIAGNOSIS — R82991 Hypocitraturia: Secondary | ICD-10-CM | POA: Diagnosis not present

## 2022-02-04 DIAGNOSIS — H31011 Macula scars of posterior pole (postinflammatory) (post-traumatic), right eye: Secondary | ICD-10-CM | POA: Diagnosis not present

## 2022-02-04 DIAGNOSIS — E113512 Type 2 diabetes mellitus with proliferative diabetic retinopathy with macular edema, left eye: Secondary | ICD-10-CM | POA: Diagnosis not present

## 2022-02-04 DIAGNOSIS — H40003 Preglaucoma, unspecified, bilateral: Secondary | ICD-10-CM | POA: Diagnosis not present

## 2022-02-09 ENCOUNTER — Telehealth: Payer: Self-pay | Admitting: *Deleted

## 2022-02-09 NOTE — Telephone Encounter (Signed)
Called patient to ask questions, spoke with patient 

## 2022-02-09 NOTE — Progress Notes (Signed)
Patient presented to [facility] on 11/14/2021 for his stage T1c adenocarcinoma of the prostate with a Gleason's score of 3+4 and a PSA of 15.3.   RN spoke with patient to assess any navigational needs since clinic due to my LOA.   Patient confirmed that he finalized he would like to proceed with brachytherapy, and is aware that we are awaiting date at this time.    Denies any other needs at this time, will continue to follow.  Plan of care in progress.

## 2022-02-11 ENCOUNTER — Other Ambulatory Visit: Payer: Self-pay | Admitting: Urology

## 2022-02-12 ENCOUNTER — Telehealth: Payer: Self-pay | Admitting: *Deleted

## 2022-02-12 NOTE — Telephone Encounter (Signed)
Called patient to inform of implant date, spoke with patient and he is aware of this date. 

## 2022-02-18 ENCOUNTER — Telehealth: Payer: Self-pay | Admitting: *Deleted

## 2022-02-18 NOTE — Telephone Encounter (Signed)
Called patient to remind of pre-seed appts. for 02-19-22, spoke with patient and he is aware of these appts.

## 2022-02-18 NOTE — Progress Notes (Signed)
Radiation Oncology         [phone] ________________________________  Outpatient Follow up- Pre-seed visit  Name: Alec Bruce MRN: [id number]  Date: 02/19/2022  DOB: 03-26-1955  VO:HYWV, [name], MD  Raynelle Bring, MD   REFERRING PHYSICIAN: Raynelle Bring, MD  DIAGNOSIS: 67 y.o. gentleman with Stage T1c adenocarcinoma of the prostate with a Gleason's score of 3+4 and a PSA of 15.3.    ICD-10-CM   1. Prostate cancer [facility])  C61       HISTORY OF PRESENT ILLNESS: Alec Bruce is a 67 y.o. male with a diagnosis of prostate cancer. He has a history of colon cancer treated with partial colectomy in the 1990's.  He was noted to have an elevated PSA of 10 by his primary care physician, Dr. Nevada Crane.  Accordingly, he was referred for evaluation in urology by Dr. Jeffie Pollock on 09/04/21,  digital rectal examination performed at that time was benign. Repeat PSA obtained that day showed further elevation to 15.3. He was also noted to have microhematuria on urinalysis that day, prompting renal stone protocol CT A/P on 09/18/21 showing a 9 mm left UVJ stone. Of note, prostate, seminal vesicles, and local lymph nodes were unremarkable. He was taken for cystoscopy, lithotripsy, and ureteral stent placement on 10/22/21, and prostate biopsy was performed at the same time. The prostate volume measured 38 cc.  Out of 12 core biopsies, 2 were positive.  The maximum Gleason score was 3+4, and this was seen in the left apex lateral (small focus) and right apex lateral.   The patient reviewed the biopsy results with his urologist and was kindly referred to [place] for discussion of potential radiation treatment options. We initially met the patient on 11/14/21 and he was undecided but after further consideration of is treatment options, he has ultimately elected to proceed with brachytherapy and SpaceOAR gel placement for treatment of his disease. He is here today for his pre-procedure imaging for planning and to answer any  additional questions he may have about this treatment.   PREVIOUS RADIATION THERAPY: No  PAST MEDICAL HISTORY:  Past Medical History:  Diagnosis Date   Anemia    Cancer ([place])    colon cancer 90s   Diabetes mellitus without complication ([place])    Hypertension    Stroke ([place])    TIA      PAST SURGICAL HISTORY: Past Surgical History:  Procedure Laterality Date   APPENDECTOMY     COLONOSCOPY WITH PROPOFOL N/A 03/31/2021   Procedure: COLONOSCOPY WITH PROPOFOL;  Surgeon: Eloise Harman, DO;  Location: [facility] ENDO SUITE;  Service: Endoscopy;  Laterality: N/A;  10:00 / ASA 3   CYSTOSCOPY WITH RETROGRADE PYELOGRAM, URETEROSCOPY AND STENT PLACEMENT Left 10/22/2021   Procedure: CYSTOSCOPY WITH RETROGRADE PYELOGRAM, URETEROSCOPY AND STENT PLACEMENT;  Surgeon: Primus Bravo., MD;  Location: [facility] ORS;  Service: Urology;  Laterality: Left;   HOLMIUM LASER APPLICATION Left 3/71/0626   Procedure: HOLMIUM LASER APPLICATION;  Surgeon: Primus Bravo., MD;  Location: [facility] ORS;  Service: Urology;  Laterality: Left;   PARTIAL COLECTOMY  1990   POLYPECTOMY  03/31/2021   Procedure: POLYPECTOMY;  Surgeon: Eloise Harman, DO;  Location: [facility] ENDO SUITE;  Service: Endoscopy;;   PROSTATE BIOPSY N/A 10/22/2021   Procedure: PROSTATE BIOPSY;  Surgeon: Primus Bravo., MD;  Location: [facility] ORS;  Service: Urology;  Laterality: N/A;    FAMILY HISTORY:  Family History  Problem Relation Age of Onset   Colon cancer Mother 29 -  62   Colon cancer Cousin     SOCIAL HISTORY:  Social History   Socioeconomic History   Marital status: Single    Spouse name: Not on file   Number of children: Not on file   Years of education: Not on file   Highest education level: Not on file  Occupational History   Not on file  Tobacco Use   Smoking status: Former   Smokeless tobacco: Never  Vaping Use   Vaping Use: Never used  Substance and Sexual Activity   Alcohol use: Yes    Comment: occ   Drug use: Not  Currently   Sexual activity: Not on file  Other Topics Concern   Not on file  Social History Narrative   Not on file   Social Determinants of Health   Financial Resource Strain: Not on file  Food Insecurity: Not on file  Transportation Needs: Not on file  Physical Activity: Not on file  Stress: Not on file  Social Connections: Not on file  Intimate Partner Violence: Not on file    ALLERGIES: Patient has no known allergies.  MEDICATIONS:  Current Outpatient Medications  Medication Sig Dispense Refill   aspirin EC 81 MG tablet Take 81 mg by mouth in the morning. Swallow whole.     atorvastatin (LIPITOR) 40 MG tablet Take 1 tablet (40 mg total) by mouth every evening. For stroke prevention 30 tablet 11   Cholecalciferol (CVS VIT D 5000 HIGH-POTENCY PO) Take 5,000 Units by mouth in the morning.     DULoxetine (CYMBALTA) 60 MG capsule Take 60 mg by mouth at bedtime.     ferrous sulfate 325 (65 FE) MG tablet Take 325 mg by mouth in the morning and at bedtime.     HYDROcodone-acetaminophen (NORCO/VICODIN) 5-325 MG tablet Take 1 tablet by mouth every 6 (six) hours as needed for severe pain. 10 tablet 0   latanoprost (XALATAN) 0.005 % ophthalmic solution Place 1 drop into both eyes at bedtime.     metoprolol succinate (TOPROL XL) 25 MG 24 hr tablet Take 1 tablet (25 mg total) by mouth daily. (Patient taking differently: Take 12.5 mg by mouth in the morning.) 30 tablet 11   phenazopyridine (PYRIDIUM) 200 MG tablet Take 1 tablet (200 mg total) by mouth 3 (three) times daily as needed (pain with urination). 30 tablet 0   potassium citrate (UROCIT-K) 10 MEQ (1080 MG) SR tablet Take 10 mEq by mouth daily.     senna-docusate (SENOKOT-S) 8.6-50 MG tablet Take 2 tablets by mouth at bedtime. (Patient taking differently: Take 2 tablets by mouth daily as needed (constipation.).) 60 tablet 3   sitaGLIPtin (JANUVIA) 100 MG tablet Take 1 tablet (100 mg total) by mouth daily. (Patient taking differently:  Take 100 mg by mouth daily after lunch.) 30 tablet 11   No current facility-administered medications for this visit.    REVIEW OF SYSTEMS:  On review of systems, the patient reports that he is doing well overall. He denies any chest pain, shortness of breath, cough, fevers, chills, night sweats, unintended weight changes. He denies any bowel disturbances, and denies abdominal pain, nausea or vomiting. He denies any new musculoskeletal or joint aches or pains. His IPSS was 5, indicating mild urinary symptoms. His SHIM was 5, indicating he has severe erectile dysfunction. A complete review of systems is obtained and is otherwise negative.    PHYSICAL EXAM:  Wt Readings from Last 3 Encounters:  12/04/21 147 lb (66.7 kg)  11/14/21  146 lb (66.2 kg)  10/21/21 144 lb (65.3 kg)   Temp Readings from Last 3 Encounters:  11/14/21 (!) 96.7 F (35.9 C) (Temporal)  10/22/21 (P) 97.9 F (36.6 C) ((P) Oral)  10/21/21 97.8 F (36.6 C) (Oral)   BP Readings from Last 3 Encounters:  01/15/22 (!) 152/78  12/04/21 98/67  11/14/21 114/76   Pulse Readings from Last 3 Encounters:  01/15/22 92  12/04/21 99  11/14/21 96    /10  In general this is a well appearing African American male in no acute distress. He's alert and oriented x4 and appropriate throughout the examination. Cardiopulmonary assessment is negative for acute distress, and he exhibits normal effort.     KPS = 100  100 - Normal; no complaints; no evidence of disease. 90   - Able to carry on normal activity; minor signs or symptoms of disease. 80   - Normal activity with effort; some signs or symptoms of disease. 43   - Cares for self; unable to carry on normal activity or to do active work. 60   - Requires occasional assistance, but is able to care for most of his personal needs. 50   - Requires considerable assistance and frequent medical care. 46   - Disabled; requires special care and assistance. 57   - Severely disabled; hospital  admission is indicated although death not imminent. 38   - Very sick; hospital admission necessary; active supportive treatment necessary. 10   - Moribund; fatal processes progressing rapidly. 0     - Dead  Karnofsky DA, Abelmann [place], Craver LS and Burchenal [facility] [phone]) The use of the nitrogen mustards in the palliative treatment of carcinoma: with particular reference to bronchogenic carcinoma Cancer 1 634-56  LABORATORY DATA:  Lab Results  Component Value Date   WBC 5.2 10/21/2021   HGB 11.3 (L) 10/21/2021   HCT 35.4 (L) 10/21/2021   MCV 94.1 10/21/2021   PLT 193 10/21/2021   Lab Results  Component Value Date   NA 137 09/04/2021   K 4.7 09/04/2021   CL 106 09/04/2021   CO2 16 (L) 09/04/2021   Lab Results  Component Value Date   ALT 33 08/01/2020   AST 29 08/01/2020   ALKPHOS 74 08/01/2020   BILITOT 1.7 (H) 08/01/2020     RADIOGRAPHY: No results found.    IMPRESSION/PLAN: 1. 67 y.o. gentleman with Stage T1c adenocarcinoma of the prostate with a Gleason's score of 3+4 and a PSA of 15.3 The patient has elected to proceed with seed implant for treatment of his disease. We reviewed the risks, benefits, short and long-term effects associated with brachytherapy and discussed the role of SpaceOAR in reducing the rectal toxicity associated with radiotherapy.  He appears to have a good understanding of his disease and our treatment recommendations which are of curative intent.  He was encouraged to ask questions that were answered to his stated satisfaction. He has freely signed written consent to proceed today in the office and a copy of this document will be placed in his medical record. His procedure is tentatively scheduled for 04/10/22 in collaboration with Dr. Jeffie Pollock and we will see him back for his post-procedure visit approximately 3 weeks thereafter. We look forward to continuing to participate in his care. He knows that he is welcome to call with any questions or concerns at any time  in the interim.  I personally spent 30 minutes in this encounter including chart review, reviewing radiological studies, meeting face-to-face with  the patient, entering orders and completing documentation.    Nicholos Johns, MMS, PA-C [place] at [place]: [phone]  Fax: [phone]

## 2022-02-18 NOTE — Progress Notes (Signed)
  Radiation Oncology         [phone] ________________________________  Name: Alec Bruce MRN: [id number]  Date: 02/19/2022  DOB: [birth year]  SIMULATION AND TREATMENT PLANNING NOTE PUBIC ARCH STUDY  [id number], [name], MD  Raynelle Bring, MD  DIAGNOSIS: 67 y.o. gentleman with stage T1c adenocarcinoma of the prostate with a Gleason's score of 3+4 and a PSA of 15.3  Oncology History  Prostate cancer ([place])  10/22/2021 Cancer Staging   Staging form: Prostate, AJCC 8th Edition - Clinical stage from 10/22/2021: Stage IIB (cT1c, cN0, cM0, PSA: 15.3, Grade Group: 2) - Signed by [name], PA-C on 11/14/2021 Histopathologic type: Adenocarcinoma, NOS Stage prefix: Initial diagnosis Prostate specific antigen (PSA) range: 10 to 19 Gleason primary pattern: 3 Gleason secondary pattern: 4 Gleason score: 7 Histologic grading system: 5 grade system Number of biopsy cores examined: 12 Number of biopsy cores positive: 2 Location of positive needle core biopsies: Both sides   11/14/2021 Initial Diagnosis   Prostate cancer ([place])       ICD-10-CM   1. Prostate cancer ([place])  C61      COMPLEX SIMULATION:  The patient presented today for evaluation for possible prostate seed implant. He was brought to the radiation planning suite and placed supine on the CT couch. A 3-dimensional image study set was obtained in upload to the planning computer. There, on each axial slice, I contoured the prostate gland. Then, using three-dimensional radiation planning tools I reconstructed the prostate in view of the structures from the transperineal needle pathway to assess for possible pubic arch interference. In doing so, I did not appreciate any pubic arch interference. Also, the patient's prostate volume was estimated based on the drawn structure. The volume was 37 cc.  Given the pubic arch appearance and prostate volume, patient remains a good candidate to proceed with prostate seed implant. Today, he  freely provided informed written consent to proceed.    PLAN: The patient will undergo prostate seed implant.   ________________________________  Sheral Apley. Tammi Klippel, M.D.

## 2022-02-19 ENCOUNTER — Ambulatory Visit
Admission: RE | Admit: 2022-02-19 | Discharge: 2022-02-19 | Disposition: A | Discharge status: Home / Self Care | Payer: Medicare Other | Source: Ambulatory Visit | Attending: Urology | Admitting: Urology

## 2022-02-19 ENCOUNTER — Ambulatory Visit
Admission: RE | Admit: 2022-02-19 | Discharge: 2022-02-19 | Disposition: A | Discharge status: Home / Self Care | Payer: Medicare Other | Source: Ambulatory Visit | Attending: Radiation Oncology | Admitting: Radiation Oncology

## 2022-02-19 ENCOUNTER — Encounter: Payer: Self-pay | Admitting: Urology

## 2022-02-19 VITALS — Resp 18 | Ht 72.0 in | Wt 150.0 lb

## 2022-02-19 DIAGNOSIS — C61 Malignant neoplasm of prostate: Secondary | ICD-10-CM | POA: Diagnosis present

## 2022-02-19 DIAGNOSIS — Z191 Hormone sensitive malignancy status: Secondary | ICD-10-CM | POA: Diagnosis not present

## 2022-02-19 NOTE — Progress Notes (Signed)
Pre-seed nursing interview for  67 y.o. gentleman with Stage T1c adenocarcinoma of the prostate with a Gleason's score of 3+4 and a PSA of 15.3.  I verified patient's identity and began nursing interview. Patient reports doing well. No issues conveyed at this time.  Meaningful use complete. No urinary management medications. Urology appt- April 4th, 2024 w/ Dr. Jeffie Pollock at [facility].  Resp 18   Ht 6' (1.829 m)   Wt 150 lb (68 kg)   BMI 20.34 kg/m   This concludes the interview.   Leandra Kern, LPN

## 2022-03-03 DIAGNOSIS — E079 Disorder of thyroid, unspecified: Secondary | ICD-10-CM | POA: Diagnosis not present

## 2022-03-03 DIAGNOSIS — E119 Type 2 diabetes mellitus without complications: Secondary | ICD-10-CM | POA: Diagnosis not present

## 2022-03-03 DIAGNOSIS — E785 Hyperlipidemia, unspecified: Secondary | ICD-10-CM | POA: Diagnosis not present

## 2022-03-10 DIAGNOSIS — F32A Depression, unspecified: Secondary | ICD-10-CM | POA: Diagnosis not present

## 2022-03-10 DIAGNOSIS — E785 Hyperlipidemia, unspecified: Secondary | ICD-10-CM | POA: Diagnosis not present

## 2022-03-10 DIAGNOSIS — D631 Anemia in chronic kidney disease: Secondary | ICD-10-CM | POA: Diagnosis not present

## 2022-03-10 DIAGNOSIS — R945 Abnormal results of liver function studies: Secondary | ICD-10-CM | POA: Diagnosis not present

## 2022-03-10 DIAGNOSIS — H409 Unspecified glaucoma: Secondary | ICD-10-CM | POA: Diagnosis not present

## 2022-03-10 DIAGNOSIS — I129 Hypertensive chronic kidney disease with stage 1 through stage 4 chronic kidney disease, or unspecified chronic kidney disease: Secondary | ICD-10-CM | POA: Diagnosis not present

## 2022-03-10 DIAGNOSIS — Z0001 Encounter for general adult medical examination with abnormal findings: Secondary | ICD-10-CM | POA: Diagnosis not present

## 2022-03-10 DIAGNOSIS — K769 Liver disease, unspecified: Secondary | ICD-10-CM | POA: Diagnosis not present

## 2022-03-10 DIAGNOSIS — E1165 Type 2 diabetes mellitus with hyperglycemia: Secondary | ICD-10-CM | POA: Diagnosis not present

## 2022-03-10 DIAGNOSIS — E079 Disorder of thyroid, unspecified: Secondary | ICD-10-CM | POA: Diagnosis not present

## 2022-03-10 DIAGNOSIS — N1832 Chronic kidney disease, stage 3b: Secondary | ICD-10-CM | POA: Diagnosis not present

## 2022-03-19 ENCOUNTER — Telehealth: Payer: Self-pay | Admitting: *Deleted

## 2022-03-19 NOTE — Telephone Encounter (Signed)
RETURNED PATIENT'S PHONE CALL, SPOKE WITH PATIENT. ?

## 2022-04-03 ENCOUNTER — Telehealth: Payer: Self-pay | Admitting: *Deleted

## 2022-04-03 NOTE — Telephone Encounter (Signed)
RETURNED PATIENT'S PHONE CALL, LVM FOR A RETURN CALL 

## 2022-04-07 ENCOUNTER — Encounter (HOSPITAL_BASED_OUTPATIENT_CLINIC_OR_DEPARTMENT_OTHER): Payer: Self-pay | Admitting: Urology

## 2022-04-07 NOTE — Progress Notes (Signed)
Spoke w/ via phone for pre-op interview--- pt Lab needs dos---- [employer] results------ current EKG in epic/ chart COVID test -----patient states asymptomatic no test needed Arrive at ------- 0930 on 04-10-2022 NPO after MN NO Solid Food.  Clear liquids from MN until--- 0830 Med rec completed Medications to take morning of surgery ----- toprol Diabetic medication ----- do not take [place] mornin of surgery.  Do half dose of lantus night before surgery  Patient instructed to bring photo id and insurance card day of surgery  Patient aware to have Driver (ride ) --- pt stated he has transportation arranged thru a service to drop off and then pick him up at discharge Explained to pt due to having had general anesthesia he had to have a responsible person with him in the car the driver could not be the person.  Pt stated this was not told to him from the office when given instructions.  But pt verbalize understanding.  Asked pt could he reach out  his friends or church members help out , pt said is kinda of late but encouraged pt to do call anyway. And I would call the cancer center and see if anything the can help with.  Will keep in touch .  / caregiver for 24 hours after surgery -- sister, Alec Bruce Patient Special Instructions ----- will do one fleet enema morning of surgery Pre-Op special Istructions ----- Patient verbalized understanding of instructions that were given at this phone interview. Patient denies shortness of breath, chest pain, fever, cough at this phone interview.

## 2022-04-08 NOTE — H&P (Signed)
Alec Seal, MD Physician Urology   Progress Notes    Signed   Encounter Date: 01/15/2022   Signed     Expand All Collapse All    Subjective: 1. Left ureteral stone   2. Renal stones   3. Prostate cancer ([place])       01/15/22: Alec Bruce returns today in f/u.  He had a KUB on 12/04/21 following left ureteroscopy on 10/22/21 and only had a small right renal stone. A renal US on 11/27/21 showed on hydro and only a 2.5cm left renal cyst.  He was seen in the [facility] for his recent prostate cancer and he is interested in pursuing a seed implant.    He has no flank pain or hematuria and his IPSS is 1. UA today has 3-10 RBC's.    10/30/21: Alec Bruce returns today in f/u from his recent left ureteroscopy and prostate biopsy.   He has some hypotension since he got started on the tamsulosin.  He has had no fever or flank pain.  His biopsy showed a 7m prostate with GG1 in 10% of the RAL core and GG2 in 5% of the LAL core.  His prebiopsy PSA was 15.  [facility] OCD, 44% ECE, 5% LNI and 4% SVI.  He doesn't need staging studies.  He has mild LUTS but severe ED.    10/02/21: Alec Bruce today in f/u for his history of an elevated PSA and microhematuria with history of stones.  He had a PSA on 8/3 that was up further to 15.3 and had a CT stone study on 09/18/21 that showed a 925mleft UVJ stone without obstruction. [place].  He has no flank pain or gross hematuria.  He has no urgency.    He has some increased frequency but has increased his fluid intake.    09/04/21: [name] a [age]o male who is sent for a recent PSA elevation of 10. He has not had a rectal exam.  He has pass a couple of kidney stones but has no other GU history. He has a reduced stream with some intermittency and post void dribbling.  He has had no hematuria or dysuria. He has had no other recent acute ailments.  He has ED.   He has DM with neuropathy and CKD3b as well as a history of  colon cancer with a partial colectomy in the 1990s.  He has 11-30 RBC's on UA today.  He had a CT stone study in 6/22 that showed non-obstructing renal stones.    ROS:   Review of Systems  All other systems reviewed and are negative.     No Known Allergies       Past Medical History:  Diagnosis Date   Anemia     [place]     [place] cancer [facility]   Di[facility] mellitus without complication ([facility]    Hypertension     [facility]     TIA           Past Surgical History:  Procedure Laterality Date   APPENDECTOMY       COLONOSCOPY WITH PROPOFOL N/A 03/31/2021    Procedure: COLONOSCOPY WITH PROPOFOL;  Surgeon: CaEloise HarmanDO;  Location: [facility] ENDO SUITE;  Service: Endoscopy;  Laterality: N/A;  10:00 / ASA 3   CYSTOSCOPY WITH RETROGRADE PYELOGRAM, URETEROSCOPY AND STENT PLACEMENT Left 10/22/2021    Procedure: CYSTOSCOPY WITH RETROGRADE PYELOGRAM,  URETEROSCOPY AND STENT PLACEMENT;  Surgeon: Primus Bravo., MD;  Location: [facility] ORS;  Service: Urology;  Laterality: Left;   HOLMIUM LASER APPLICATION Left 0000000    Procedure: HOLMIUM LASER APPLICATION;  Surgeon: Primus Bravo., MD;  Location: [facility] ORS;  Service: Urology;  Laterality: Left;   PARTIAL COLECTOMY   1990   POLYPECTOMY   03/31/2021    Procedure: POLYPECTOMY;  Surgeon: Eloise Harman, DO;  Location: [facility] END[facility] SUITE;  Service: Endoscopy;;   PROSTATE BIOPSY N/A 10/22/2021    Procedure: PROSTATE BIOPSY;  Surgeon: Primus Bravo., MD;  Location: [facility] ORS;  Service: Urology;  Laterality: N/A;      Social History         Socioeconomic History   Marital status: Single      Spouse name: Not on file   Number of children: Not on file   Years of education: Not on file   Highest education level: Not on file  Occupational History   Not on file  Tobacco Use   Smoking status: Former   Smokeless tobacco: Never  Vaping Use   Vaping Use: Never used  Substance and Sexual Activity   Alcohol use: Yes      Comment:  occ   Drug use: Not Currently   Sexual activity: Not on file  Other Topics Concern   Not on file  Social History Narrative   Not on file    Social Determinants of Health    Financial Resource Strain: Not on file  Food Insecurity: Not on file  Transportation Needs: Not on file  Physical Activity: Not on file  Stress: Not on file  Social Connections: Not on file  Intimate Partner Violence: Not on file           Family History  Problem Relation Age of Onset   Colon cancer Mother 57 - 55   Colon cancer Cousin        Anti-infectives: Anti-infectives (From admission, onward)        None                   Current Outpatient Medications  Medication Sig Dispense Refill   aspirin EC 81 MG tablet Take 81 mg by mouth in the morning. Swallow whole.       atorvastatin (LIPITOR) 40 MG tablet Take 1 tablet (40 mg total) by mouth every evening. For stroke prevention 30 tablet 11   Cholecalciferol ([employer] VIT D 5000 HIGH-POTENCY PO) Take 5,000 Units by mouth in the morning.       DULoxetine (CYMBALTA) 60 MG capsule Take 60 mg by mouth at bedtime.       ferrous sulfate 325 (65 FE) MG tablet Take 325 mg by mouth in the morning and at bedtime.       HYDROcodone-acetaminophen (NORCO/VICODIN) 5-325 MG tablet Take 1 tablet by mouth every 6 (six) hours as needed for severe pain. 10 tablet 0   latanoprost (XALATAN) 0.005 % ophthalmic solution Place 1 drop into both eyes at bedtime.       phenazopyridine (PYRIDIUM) 200 MG tablet Take 1 tablet (200 mg total) by mouth 3 (three) times daily as needed (pain with urination). 30 tablet 0   potassium citrate (UROCIT-K) 10 MEQ (1080 MG) SR tablet Take 10 mEq by mouth daily.       senna-docusate (SENOKOT-S) 8.6-50 MG tablet Take 2 tablets by mouth at bedtime. (Patient taking differently: Take 2 tablets by mouth daily as needed (constipation.).) 60  tablet 3   sitaGLIPtin (JANUVIA) 100 MG tablet Take 1 tablet (100 mg total) by mouth daily. (Patient taking  differently: Take 100 mg by mouth daily after lunch.) 30 tablet 11   metoprolol succinate (TOPROL XL) 25 MG 24 hr tablet Take 1 tablet (25 mg total) by mouth daily. (Patient taking differently: Take 12.5 mg by mouth in the morning.) 30 tablet 11    No current facility-administered medications for this visit.        Objective: Vital signs in last 24 hours: BP (!) 152/78   Pulse 92    Intake/Output from previous day: No intake/output data recorded. Intake/Output this shift: '@IOTHISSHIFT'$ @     Physical Exam Vitals reviewed.  Constitutional:      Appearance: Normal appearance.  Cardiovascular:     Rate and Rhythm: Normal rate and regular rhythm.     Heart sounds: Normal heart sounds.  Pulmonary:     Effort: Pulmonary effort is normal. No respiratory distress.     Breath sounds: Normal breath sounds.  Neurological:     Mental Status: He is alert.        Lab Results:  Lab Results Last 24 Hours       Results for orders placed or performed in visit on 01/15/22 (from the past 24 hour(s))  Urinalysis, Routine w reflex microscopic     Status: Abnormal    Collection Time: 01/15/22  8:51 AM  Result Value Ref Range    Specific Gravity, UA 1.010 1.005 - 1.030    pH, UA 5.0 5.0 - 7.5    Color, UA Yellow Yellow    Appearance Ur Clear Clear    Leukocytes,UA Negative Negative    Protein,UA Negative Negative/Trace    Glucose, UA Negative Negative    Ketones, UA Negative Negative    RBC, UA 2+ (A) Negative    Bilirubin, UA Negative Negative    Urobilinogen, Ur 0.2 0.2 - 1.0 mg/dL    Nitrite, UA Negative Negative    Microscopic Examination See below:      Narrative    Performed at:  [facility] [address] [place], [place]  [id number] Lab Director: Mina Marble MT, Phone:  [phone]  Microscopic Examination     Status: Abnormal    Collection Time: 01/15/22  8:51 AM    Urine  Result Value Ref Range    WBC, UA None seen 0 - 5 /hpf    RBC, Urine 3-10 (A) 0 -  2 /hpf    Epithelial Cells (non renal) 0-10 0 - 10 /hpf    Bacteria, UA None seen None seen/Few    Narrative    Performed at:  [facility] [address], [place], [place]  [id number] Lab Director: [place], Phone:  [phone]        BMET Recent Labs (last 2 labs)  No results for input(s): "NA", "K", "CL", "CO2", "GLUCOSE", "BUN", "CREATININE", "CALCIUM" in the last 72 hours.     PT/INR Recent Labs (last 2 labs)  No results for input(s): "LABPROT", "INR" in the last 72 hours.   ABG  Recent Labs (last 2 labs)  No results for input(s): "PHART", "HCO3" in the last 72 hours.   Invalid input(s): "PCO2", "PO2"   UA has 3-10 RBC's.  Studies/Results: Imaging Results (Last 48 hours)  No results found.    Imaging Results  DG Abd 1 View   Result Date: 12/07/2021 CLINICAL DATA:  Nephrolithiasis. EXAM: ABDOMEN -  1 VIEW COMPARISON:  August 01, 2020.  September 18, 2021. FINDINGS: The bowel gas pattern is normal. Small right renal calculus is noted. Left renal calculi are not clearly visualized. IMPRESSION: Small right renal calculus.  No abnormal bowel dilatation. Electronically Signed   By: Marijo Conception M.D.   On: 12/07/2021 08:38    US RENAL   Result Date: 11/27/2021 CLINICAL DATA:  Ureteral calculus LEFT, follow-up, post lithotripsy EXAM: RENAL / URINARY TRACT ULTRASOUND COMPLETE COMPARISON:  CT abdomen and pelvis 09/18/2021 FINDINGS: Right Kidney: Renal measurements: 9.1 x 4.5 x 5.4 cm = volume: 117 mL. Normal cortical thickness. Increased cortical echogenicity. No mass, hydronephrosis, or shadowing calcification. Left Kidney: Renal measurements: 10.4 x 5.5 x 5.1 cm = volume: 154 mL. Normal cortical thickness. Increased cortical echogenicity. Cyst identified inferior pole simple features 2.2 x 2.3 x 2.5 cm; no follow-up imaging recommended. No additional cyst or mass identified. No hydronephrosis or urinary tract calcification. Bladder: Appears normal for degree of  bladder distention. BILATERAL ureteral jets noted. Other: None. IMPRESSION: Medical renal disease changes of both kidneys. No evidence of renal mass or hydronephrosis. 2.5 cm simple cyst LEFT kidney; no follow-up imaging recommended. Electronically Signed   By: Lavonia Dana M.D.   On: 11/27/2021 18:59    DG C-Arm 1-60 Min-No Report   Result Date: 10/22/2021 Fluoroscopy was utilized by the requesting physician.  No radiographic interpretation.    US Guided Needle Placement   Result Date: 10/22/2021 CLINICAL DATA:  Ultrasound was provided for use by the ordering physician.  No provider Interpretation or professional fees incurred.          Assessment/Plan:   Hx of left ureteral stone.   He is doing well s/p ureteroscopy with minimal microhematuria and a small right renal stone on f/u imaging.    Prostate cancer.   He has T1c Nx Mx GG2 intermediate risk prostate cancer. He has decided on a seed implant with [employer] for management.  I reviewed the risks again with him and will get this scheduled.   No orders of the defined types were placed in this encounter.        Orders Placed This Encounter  Procedures   Microscopic Examination   Urinalysis, Routine w reflex microscopic      Return for I will get him scheduled for a seed implant in [place]..      CC: Dr. Celene Squibb.          Alec Bruce 01/16/2022          Electronically signed by Alec Seal, MD at 01/16/2022  7:22 AM     Office Visit on 01/15/2022       Detailed Report      Note viewed by patient Additional Documentation  Vitals: BP 152/78 Important    Pulse 92  Flowsheets: NEWS,   MEWS Score,   IPSS  Encounter Info: Billing Info,   History,   Allergies,   Detailed Report   Orders Placed   Microscopic Examination  Urinalysis, Routine w reflex microscopic All Encounter Results Medication Changes   None Medication List Visit Diagnoses   Left ureteral stone  Renal  stones  Prostate cancer ([place]) Problem List

## 2022-04-09 ENCOUNTER — Other Ambulatory Visit: Payer: Self-pay

## 2022-04-09 ENCOUNTER — Telehealth: Payer: Self-pay | Admitting: *Deleted

## 2022-04-09 NOTE — Progress Notes (Signed)
For Anesthesia: PCP - Celene Squibb, MD  Cardiologist - N/A  Chest x-ray - greater than 1 year EKG - 10/21/21 Stress Test -  ECHO - 03/21/19 Cardiac Cath - N/A Pacemaker/ICD device last checked: N/A Pacemaker orders received: N/A Device Rep notified: N/A  Spinal Cord Stimulator: N/A  Sleep Study - N/A CPAP - N/A  Fasting Blood Sugar - 110-130 Checks Blood Sugar __1___ times a day Date and result of last Hgb A1c- called Dr. Juel Burrow office to request, patient stated was drawn in 2/24  Last dose of GLP1 agonist- N/A GLP1 instructions: N/A  Last dose of SGLT-2 inhibitors- N/A SGLT-2 instructions:N/A  Blood Thinner Instructions:N/A Aspirin Instructions:N/A Last Dose:N/A  Activity level: Able to exercise without chest pain and/or shortness of breath       Anesthesia review: HTN, DM, CKD III  Patient denies shortness of breath, fever, cough and chest pain at PAT appointment   Patient verbalized understanding of instructions reviewed via telephone.

## 2022-04-09 NOTE — Telephone Encounter (Signed)
CALLED PATIENT TO REMIND OF PROCEDURE FOR 04-10-22, SPOKE WITH PATIENT AND HE IS AWARE OF THIS PROCEDURE

## 2022-04-10 ENCOUNTER — Ambulatory Visit (HOSPITAL_BASED_OUTPATIENT_CLINIC_OR_DEPARTMENT_OTHER): Payer: Medicare Other | Admitting: Physician Assistant

## 2022-04-10 ENCOUNTER — Ambulatory Visit (HOSPITAL_COMMUNITY): Payer: Medicare Other

## 2022-04-10 ENCOUNTER — Ambulatory Visit (HOSPITAL_COMMUNITY)
Admission: RE | Admit: 2022-04-10 | Discharge: 2022-04-10 | Disposition: A | Discharge status: Home / Self Care | Payer: Medicare Other | Attending: Urology | Admitting: Urology

## 2022-04-10 ENCOUNTER — Ambulatory Visit (HOSPITAL_COMMUNITY): Payer: Medicare Other | Admitting: Physician Assistant

## 2022-04-10 ENCOUNTER — Other Ambulatory Visit: Payer: Self-pay

## 2022-04-10 ENCOUNTER — Encounter (HOSPITAL_COMMUNITY)
Admission: RE | Disposition: A | Discharge status: Home / Self Care | Payer: Self-pay | Source: Home / Self Care | Attending: Urology

## 2022-04-10 ENCOUNTER — Encounter (HOSPITAL_COMMUNITY): Payer: Self-pay | Admitting: Urology

## 2022-04-10 DIAGNOSIS — C61 Malignant neoplasm of prostate: Secondary | ICD-10-CM | POA: Insufficient documentation

## 2022-04-10 DIAGNOSIS — Z794 Long term (current) use of insulin: Secondary | ICD-10-CM

## 2022-04-10 DIAGNOSIS — Z87891 Personal history of nicotine dependence: Secondary | ICD-10-CM | POA: Diagnosis not present

## 2022-04-10 DIAGNOSIS — Z800 Family history of malignant neoplasm of digestive organs: Secondary | ICD-10-CM | POA: Diagnosis not present

## 2022-04-10 DIAGNOSIS — D631 Anemia in chronic kidney disease: Secondary | ICD-10-CM | POA: Diagnosis not present

## 2022-04-10 DIAGNOSIS — Z87442 Personal history of urinary calculi: Secondary | ICD-10-CM | POA: Insufficient documentation

## 2022-04-10 DIAGNOSIS — E119 Type 2 diabetes mellitus without complications: Secondary | ICD-10-CM | POA: Insufficient documentation

## 2022-04-10 DIAGNOSIS — I10 Essential (primary) hypertension: Secondary | ICD-10-CM | POA: Insufficient documentation

## 2022-04-10 DIAGNOSIS — E1122 Type 2 diabetes mellitus with diabetic chronic kidney disease: Secondary | ICD-10-CM | POA: Diagnosis not present

## 2022-04-10 DIAGNOSIS — N189 Chronic kidney disease, unspecified: Secondary | ICD-10-CM

## 2022-04-10 DIAGNOSIS — I129 Hypertensive chronic kidney disease with stage 1 through stage 4 chronic kidney disease, or unspecified chronic kidney disease: Secondary | ICD-10-CM

## 2022-04-10 DIAGNOSIS — Z01818 Encounter for other preprocedural examination: Secondary | ICD-10-CM

## 2022-04-10 DIAGNOSIS — Z191 Hormone sensitive malignancy status: Secondary | ICD-10-CM | POA: Diagnosis not present

## 2022-04-10 DIAGNOSIS — E1165 Type 2 diabetes mellitus with hyperglycemia: Secondary | ICD-10-CM

## 2022-04-10 HISTORY — DX: Personal history of urinary calculi: Z87.442

## 2022-04-10 HISTORY — DX: Personal history of antineoplastic chemotherapy: Z92.21

## 2022-04-10 HISTORY — DX: Hypocitraturia: R82.991

## 2022-04-10 HISTORY — DX: Calculus of kidney: N20.0

## 2022-04-10 HISTORY — DX: Chronic metabolic acidosis: E87.22

## 2022-04-10 HISTORY — DX: Nontoxic multinodular goiter: E04.2

## 2022-04-10 HISTORY — DX: Other obstructive and reflux uropathy: N40.1

## 2022-04-10 HISTORY — DX: Polyneuropathy, unspecified: G62.9

## 2022-04-10 HISTORY — DX: Anemia in other chronic diseases classified elsewhere: D63.8

## 2022-04-10 HISTORY — DX: Presence of spectacles and contact lenses: Z97.3

## 2022-04-10 HISTORY — PX: SPACE OAR INSTILLATION: SHX6769

## 2022-04-10 HISTORY — DX: Type 2 diabetes mellitus with other diabetic kidney complication: R80.9

## 2022-04-10 HISTORY — DX: Type 2 diabetes mellitus without complications: E11.9

## 2022-04-10 HISTORY — DX: Hyperlipidemia, unspecified: E78.5

## 2022-04-10 HISTORY — DX: Benign prostatic hyperplasia with lower urinary tract symptoms: N13.8

## 2022-04-10 HISTORY — PX: RADIOACTIVE SEED IMPLANT: SHX5150

## 2022-04-10 HISTORY — DX: Proteinuria, unspecified: E11.29

## 2022-04-10 HISTORY — DX: Secondary hyperparathyroidism of renal origin: N25.81

## 2022-04-10 HISTORY — DX: Chronic kidney disease, stage 3 unspecified: N18.30

## 2022-04-10 LAB — GLUCOSE, CAPILLARY
Glucose-Capillary: 111 mg/dL — ABNORMAL HIGH (ref 70–99)
Glucose-Capillary: 98 mg/dL (ref 70–99)

## 2022-04-10 SURGERY — INSERTION, RADIATION SOURCE, PROSTATE
Anesthesia: General

## 2022-04-10 MED ORDER — CHLORHEXIDINE GLUCONATE 0.12 % MT SOLN
15.0000 mL | Freq: Once | OROMUCOSAL | Status: AC
  Administered 2022-04-10: 15 mL via OROMUCOSAL

## 2022-04-10 MED ORDER — SODIUM CHLORIDE (PF) 0.9 % IJ SOLN
INTRAMUSCULAR | Status: DC | PRN
  Administered 2022-04-10: 10 mL

## 2022-04-10 MED ORDER — SODIUM CHLORIDE 0.9% FLUSH: 3.0000 mL | INTRAVENOUS | Status: DC | PRN

## 2022-04-10 MED ORDER — MIDAZOLAM HCL 2 MG/2ML IJ SOLN
INTRAMUSCULAR | Status: AC
  Filled 2022-04-10: qty 2

## 2022-04-10 MED ORDER — CIPROFLOXACIN IN D5W 400 MG/200ML IV SOLN
400.0000 mg | INTRAVENOUS | Status: AC
  Administered 2022-04-10: 400 mg via INTRAVENOUS
  Filled 2022-04-10: qty 200

## 2022-04-10 MED ORDER — ONDANSETRON HCL 4 MG/2ML IJ SOLN
INTRAMUSCULAR | Status: DC | PRN
  Administered 2022-04-10: 4 mg via INTRAVENOUS

## 2022-04-10 MED ORDER — ACETAMINOPHEN 10 MG/ML IV SOLN: 1000.0000 mg | Freq: Once | INTRAVENOUS | Status: DC | PRN

## 2022-04-10 MED ORDER — PHENYLEPHRINE HCL (PRESSORS) 10 MG/ML IV SOLN
INTRAVENOUS | Status: AC
  Filled 2022-04-10: qty 1

## 2022-04-10 MED ORDER — DEXAMETHASONE SODIUM PHOSPHATE 10 MG/ML IJ SOLN
INTRAMUSCULAR | Status: DC | PRN
  Administered 2022-04-10: 4 mg via INTRAVENOUS

## 2022-04-10 MED ORDER — ACETAMINOPHEN 325 MG PO TABS: 650.0000 mg | ORAL_TABLET | ORAL | Status: DC | PRN

## 2022-04-10 MED ORDER — SODIUM CHLORIDE 0.9% FLUSH: 3.0000 mL | Freq: Two times a day (BID) | INTRAVENOUS | Status: DC

## 2022-04-10 MED ORDER — PHENYLEPHRINE HCL-NACL 20-0.9 MG/250ML-% IV SOLN
INTRAVENOUS | Status: DC | PRN
  Administered 2022-04-10: 40 ug/min via INTRAVENOUS

## 2022-04-10 MED ORDER — FLEET ENEMA 7-19 GM/118ML RE ENEM
1.0000 | ENEMA | Freq: Once | RECTAL | Status: DC
  Filled 2022-04-10: qty 1

## 2022-04-10 MED ORDER — EPHEDRINE 5 MG/ML INJ
INTRAVENOUS | Status: AC
  Filled 2022-04-10: qty 5

## 2022-04-10 MED ORDER — MIDAZOLAM HCL 5 MG/5ML IJ SOLN
INTRAMUSCULAR | Status: DC | PRN
  Administered 2022-04-10 (×2): 1 mg via INTRAVENOUS

## 2022-04-10 MED ORDER — FENTANYL CITRATE (PF) 100 MCG/2ML IJ SOLN
INTRAMUSCULAR | Status: DC | PRN
  Administered 2022-04-10 (×2): 50 ug via INTRAVENOUS
  Administered 2022-04-10 (×2): 25 ug via INTRAVENOUS

## 2022-04-10 MED ORDER — SODIUM CHLORIDE (PF) 0.9 % IJ SOLN
INTRAMUSCULAR | Status: AC
  Filled 2022-04-10: qty 50

## 2022-04-10 MED ORDER — PROPOFOL 10 MG/ML IV BOLUS
INTRAVENOUS | Status: AC
  Filled 2022-04-10: qty 20

## 2022-04-10 MED ORDER — SODIUM CHLORIDE 0.9 % IV SOLN: INTRAVENOUS | Status: DC

## 2022-04-10 MED ORDER — FENTANYL CITRATE PF 50 MCG/ML IJ SOSY: 25.0000 ug | PREFILLED_SYRINGE | INTRAMUSCULAR | Status: DC | PRN

## 2022-04-10 MED ORDER — ACETAMINOPHEN 650 MG RE SUPP: 650.0000 mg | RECTAL | Status: DC | PRN

## 2022-04-10 MED ORDER — MORPHINE SULFATE (PF) 2 MG/ML IV SOLN: 2.0000 mg | INTRAVENOUS | Status: DC | PRN

## 2022-04-10 MED ORDER — STERILE WATER FOR IRRIGATION IR SOLN
Status: DC | PRN
  Administered 2022-04-10: 1000 mL

## 2022-04-10 MED ORDER — OXYCODONE HCL 5 MG PO TABS: 5.0000 mg | ORAL_TABLET | ORAL | Status: DC | PRN

## 2022-04-10 MED ORDER — EPHEDRINE SULFATE (PRESSORS) 50 MG/ML IJ SOLN
INTRAMUSCULAR | Status: DC | PRN
  Administered 2022-04-10: 5 mg via INTRAVENOUS
  Administered 2022-04-10: 10 mg via INTRAVENOUS

## 2022-04-10 MED ORDER — PROPOFOL 10 MG/ML IV BOLUS
INTRAVENOUS | Status: DC | PRN
  Administered 2022-04-10 (×2): 10 mg via INTRAVENOUS
  Administered 2022-04-10: 140 mg via INTRAVENOUS
  Administered 2022-04-10: 10 mg via INTRAVENOUS

## 2022-04-10 MED ORDER — HYDROCODONE-ACETAMINOPHEN 5-325 MG PO TABS: 1.0000 | ORAL_TABLET | ORAL | 0 refills | Status: DC | PRN

## 2022-04-10 MED ORDER — FENTANYL CITRATE (PF) 250 MCG/5ML IJ SOLN
INTRAMUSCULAR | Status: AC
  Filled 2022-04-10: qty 5

## 2022-04-10 MED ORDER — LIDOCAINE HCL (CARDIAC) PF 100 MG/5ML IV SOSY
PREFILLED_SYRINGE | INTRAVENOUS | Status: DC | PRN
  Administered 2022-04-10: 60 mg via INTRAVENOUS

## 2022-04-10 MED ORDER — SODIUM CHLORIDE 0.9 % IV SOLN: 250.0000 mL | INTRAVENOUS | Status: DC | PRN

## 2022-04-10 SURGICAL SUPPLY — 39 items
BAG COUNTER SPONGE SURGICOUNT (BAG) IMPLANT
BAG DRN RND TRDRP ANRFLXCHMBR (UROLOGICAL SUPPLIES) ×1
BAG SPNG CNTER NS LX DISP (BAG)
BAG URINE DRAIN 2000ML AR STRL (UROLOGICAL SUPPLIES) ×1 IMPLANT
BLADE CLIPPER SURG (BLADE) ×1 IMPLANT
CATH FOLEY 2WAY SLVR  5CC 16FR (CATHETERS) ×1
CATH FOLEY 2WAY SLVR 5CC 16FR (CATHETERS) ×1 IMPLANT
CATH ROBINSON RED A/P 20FR (CATHETERS) ×1 IMPLANT
COVER SURGICAL LIGHT HANDLE (MISCELLANEOUS) ×1 IMPLANT
DRAPE SURG IRRIG POUCH 19X23 (DRAPES) ×1 IMPLANT
DRAPE U-SHAPE 47X51 STRL (DRAPES) ×1 IMPLANT
DRSG TEGADERM 4X4.75 (GAUZE/BANDAGES/DRESSINGS) ×2 IMPLANT
DRSG TEGADERM 8X12 (GAUZE/BANDAGES/DRESSINGS) ×2 IMPLANT
GLOVE BIO SURGEON STRL SZ7.5 (GLOVE) ×1 IMPLANT
GLOVE BIOGEL PI IND STRL 8 (GLOVE) ×3 IMPLANT
GLOVE ECLIPSE 8.0 STRL XLNG CF (GLOVE) ×1 IMPLANT
GLOVE SURG SS PI 8.0 STRL IVOR (GLOVE) IMPLANT
GOWN STRL REUS W/ TWL XL LVL3 (GOWN DISPOSABLE) ×1 IMPLANT
GOWN STRL REUS W/TWL XL LVL3 (GOWN DISPOSABLE) ×1
GRID BRACH TEMP 18GA 2.8X3X.75 (MISCELLANEOUS) IMPLANT
HOLDER FOLEY CATH W/STRAP (MISCELLANEOUS) ×1 IMPLANT
IMPL SPACEOAR VUE SYSTEM (Spacer) ×1 IMPLANT
IMPLANT SPACEOAR VUE SYSTEM (Spacer) ×1 IMPLANT
KIT TURNOVER KIT A (KITS) IMPLANT
MARKER SKIN DUAL TIP RULER LAB (MISCELLANEOUS) ×2 IMPLANT
NDL BRACHY 18G 5PK (NEEDLE) ×1 IMPLANT
NDL BRACHY 18G SINGLE (NEEDLE) ×1 IMPLANT
NDL PK MORGANSTERN STABILIZ (NEEDLE) IMPLANT
NEEDLE BRACHY 18G 5PK (NEEDLE) ×1 IMPLANT
NEEDLE BRACHY 18G SINGLE (NEEDLE) ×1 IMPLANT
NEEDLE PK MORGANSTERN STABILIZ (NEEDLE) IMPLANT
PACK CYSTO (CUSTOM PROCEDURE TRAY) ×1 IMPLANT
PENCIL SMOKE EVACUATOR (MISCELLANEOUS) IMPLANT
STRIP CLOSURE SKIN 1/2X4 (GAUZE/BANDAGES/DRESSINGS) ×1 IMPLANT
SURGILUBE 2OZ TUBE FLIPTOP (MISCELLANEOUS) ×1 IMPLANT
SYR 10ML LL (SYRINGE) ×1 IMPLANT
TOWEL OR 17X26 10 PK STRL BLUE (TOWEL DISPOSABLE) ×1 IMPLANT
UNDERPAD 30X36 HEAVY ABSORB (UNDERPADS AND DIAPERS) ×2 IMPLANT
radioactive seeds IMPLANT

## 2022-04-10 NOTE — Anesthesia Procedure Notes (Signed)
Procedure Name: LMA Insertion Date/Time: 04/10/2022 12:02 PM  Performed by: Garrel Ridgel, CRNAPre-anesthesia Checklist: Patient identified, Emergency Drugs available, Suction available and Patient being monitored Patient Re-evaluated:Patient Re-evaluated prior to induction Oxygen Delivery Method: Circle system utilized Preoxygenation: Pre-oxygenation with 100% oxygen Induction Type: IV induction Ventilation: Mask ventilation without difficulty LMA: LMA with gastric port inserted LMA Size: 4.0 Tube type: Oral Number of attempts: 1 Placement Confirmation: positive ETCO2 and breath sounds checked- equal and bilateral Tube secured with: Tape Dental Injury: Teeth and Oropharynx as per pre-operative assessment

## 2022-04-10 NOTE — Anesthesia Preprocedure Evaluation (Signed)
Anesthesia Evaluation  Patient identified by MRN, date of birth, ID band Patient awake    Reviewed: Allergy & Precautions, NPO status , Patient's Chart, lab work & pertinent test results  Airway Mallampati: II  TM Distance: >3 FB Neck ROM: Full    Dental no notable dental hx.    Pulmonary former smoker   Pulmonary exam normal        Cardiovascular hypertension, Pt. on medications and Pt. on home beta blockers  Rhythm:Regular Rate:Normal     Neuro/Psych TIA negative psych ROS   GI/Hepatic negative GI ROS, Neg liver ROS,,,  Endo/Other  diabetes, Type 2, Insulin Dependent    Renal/GU CRFRenal diseaseProstate Ca  negative genitourinary   Musculoskeletal negative musculoskeletal ROS (+)    Abdominal Normal abdominal exam  (+)   Peds  Hematology  (+) Blood dyscrasia, anemia   Anesthesia Other Findings   Reproductive/Obstetrics                             Anesthesia Physical Anesthesia Plan  ASA: 3  Anesthesia Plan: General   Post-op Pain Management:    Induction: Intravenous  PONV Risk Score and Plan: 2 and Ondansetron, Dexamethasone, Treatment may vary due to age or medical condition and Midazolam  Airway Management Planned: Mask and LMA  Additional Equipment: None  Intra-op Plan:   Post-operative Plan: Extubation in OR  Informed Consent: I have reviewed the patients History and Physical, chart, labs and discussed the procedure including the risks, benefits and alternatives for the proposed anesthesia with the patient or authorized representative who has indicated his/her understanding and acceptance.     Dental advisory given  Plan Discussed with: CRNA  Anesthesia Plan Comments:        Anesthesia Quick Evaluation

## 2022-04-10 NOTE — Progress Notes (Signed)
Patient has attempted to pee once but was unsuccessful.  Will try again prior to discharge.

## 2022-04-10 NOTE — Anesthesia Postprocedure Evaluation (Signed)
Anesthesia Post Note  Patient: Alec Bruce  Procedure(s) Performed: RADIOACTIVE SEED IMPLANT/BRACHYTHERAPY IMPLANT SPACE OAR INSTILLATION     Patient location during evaluation: PACU Anesthesia Type: General Level of consciousness: awake and alert Pain management: pain level controlled Vital Signs Assessment: post-procedure vital signs reviewed and stable Respiratory status: spontaneous breathing, nonlabored ventilation, respiratory function stable and patient connected to nasal cannula oxygen Cardiovascular status: blood pressure returned to baseline and stable Postop Assessment: no apparent nausea or vomiting Anesthetic complications: no   No notable events documented.  Last Vitals:  Vitals:   04/10/22 1424 04/10/22 1430  BP: 131/71   Pulse: 87 89  Resp:    Temp:    SpO2: 100% 100%    Last Pain:  Vitals:   04/10/22 1345  TempSrc:   PainSc: 0-No pain                 Belenda Cruise P Arvle Grabe

## 2022-04-10 NOTE — Interval H&P Note (Signed)
History and Physical Interval Note:  04/10/2022 11:11 AM  Alec Bruce  has presented today for surgery, with the diagnosis of PROSTATE CANCER.  The various methods of treatment have been discussed with the patient and family. After consideration of risks, benefits and other options for treatment, the patient has consented to  Procedure(s): RADIOACTIVE SEED IMPLANT/BRACHYTHERAPY IMPLANT (N/A) SPACE OAR INSTILLATION (N/A) as a surgical intervention.  The patient's history has been reviewed, patient examined, no change in status, stable for surgery.  I have reviewed the patient's chart and labs.  Questions were answered to the patient's satisfaction.     Irine Seal

## 2022-04-10 NOTE — Transfer of Care (Signed)
Immediate Anesthesia Transfer of Care Note  Patient: Alec Bruce  Procedure(s) Performed: RADIOACTIVE SEED IMPLANT/BRACHYTHERAPY IMPLANT SPACE OAR INSTILLATION  Patient Location: PACU  Anesthesia Type:General  Level of Consciousness: oriented, drowsy, and patient cooperative  Airway & Oxygen Therapy: Patient Spontanous Breathing and Patient connected to face mask oxygen  Post-op Assessment: Report given to RN and Post -op Vital signs reviewed and stable  Post vital signs: Reviewed and stable  Last Vitals:  Vitals Value Taken Time  BP 141/78 04/10/22 1326  Temp 36.6 C 04/10/22 1326  Pulse 80 04/10/22 1328  Resp 10 04/10/22 1328  SpO2 100 % 04/10/22 1328  Vitals shown include unvalidated device data.  Last Pain:  Vitals:   04/10/22 1326  TempSrc:   PainSc: Asleep         Complications: No notable events documented.

## 2022-04-11 LAB — POCT I-STAT, CHEM 8
BUN: 22 mg/dL (ref 8–23)
Calcium, Ion: 1.31 mmol/L (ref 1.15–1.40)
Chloride: 105 mmol/L (ref 98–111)
Creatinine, Ser: 1.9 mg/dL — ABNORMAL HIGH (ref 0.61–1.24)
Glucose, Bld: 112 mg/dL — ABNORMAL HIGH (ref 70–99)
HCT: 35 % — ABNORMAL LOW (ref 39.0–52.0)
Hemoglobin: 11.9 g/dL — ABNORMAL LOW (ref 13.0–17.0)
Potassium: 4.6 mmol/L (ref 3.5–5.1)
Sodium: 139 mmol/L (ref 135–145)
TCO2: 23 mmol/L (ref 22–32)

## 2022-04-11 LAB — HEMOGLOBIN A1C
Hgb A1c MFr Bld: 8.1 % — ABNORMAL HIGH (ref 4.8–5.6)
Mean Plasma Glucose: 186 mg/dL

## 2022-04-11 NOTE — Progress Notes (Signed)
Radiation Oncology         [phone] ________________________________  Name: Alec Bruce MRN: [id number]  Date: 04/11/2022  DOB: March 08, 1955       Prostate Seed Implant  [id number], [name], MD  No ref. provider found  DIAGNOSIS:   67 y.o. gentleman with stage T1c adenocarcinoma of the prostate with a Gleason's score of 3+4 and a PSA of 15.3  Oncology History  Prostate cancer ([place])  10/22/2021 Cancer Staging   Staging form: Prostate, AJCC 8th Edition - Clinical stage from 10/22/2021: Stage IIB (cT1c, cN0, cM0, PSA: 15.3, Grade Group: 2) - Signed by [name], PA-C on 11/14/2021 Histopathologic type: Adenocarcinoma, NOS Stage prefix: Initial diagnosis Prostate specific antigen (PSA) range: 10 to 19 Gleason primary pattern: 3 Gleason secondary pattern: 4 Gleason score: 7 Histologic grading system: 5 grade system Number of biopsy cores examined: 12 Number of biopsy cores positive: 2 Location of positive needle core biopsies: Both sides   11/14/2021 Initial Diagnosis   Prostate cancer ([place])       ICD-10-CM   1. Pre-op testing  Z01.818 CANCELED: CBG per Guidelines for Diabetes Management for Patients Undergoing Surgery ([facility], [facility], and [facility] only)    CANCELED: CBG per protocol    CANCELED: I-Stat, Chem 8 on day of surgery per protocol    CANCELED: CBG per Guidelines for Diabetes Management for Patients Undergoing Surgery ([facility], [facility], and [facility] only)    CANCELED: CBG per protocol    CANCELED: I-Stat, Chem 8 on day of surgery per protocol    2. Uncontrolled type 2 diabetes mellitus with hyperglycemia, without long-term current use of insulin (HCC)  E11.65 Hemoglobin A1c    Hemoglobin A1c      PROCEDURE: Insertion of radioactive I-125 seeds into the prostate gland.  RADIATION DOSE: 145 Gy, definitive therapy.  TECHNIQUE: Alec Bruce was brought to the operating room with the urologist. He was placed in the dorsolithotomy position. He was catheterized and a rectal tube was  inserted. The perineum was shaved, prepped and draped. The ultrasound probe was then introduced by me into the rectum to see the prostate gland.  TREATMENT DEVICE: I attached the needle grid to the ultrasound probe stand and anchor needles were placed.  3D PLANNING: The prostate was imaged in 3D using a sagittal sweep of the prostate probe. These images were transferred to the planning computer. There, the prostate, urethra and rectum were defined on each axial reconstructed image. Then, the software created an optimized 3D plan and a few seed positions were adjusted. The quality of the plan was reviewed using [facility] information for the target and the following two organs at risk:  Urethra and Rectum.  Then the accepted plan was printed and handed off to the radiation therapist.  Under my supervision, the custom loading of the seeds and spacers was carried out using the quick loader.  These pre-loaded needles were then placed into the needle holder.Marland Kitchen  PROSTATE VOLUME STUDY:  Using transrectal ultrasound the volume of the prostate was verified to be 37.6 cc.  SPECIAL TREATMENT PROCEDURE/SUPERVISION AND HANDLING: The pre-loaded needles were then delivered by the urologist under sagittal guidance. A total of 16 needles were used to deposit 64 seeds in the prostate gland. The individual seed activity was 0.427 mCi.  SpaceOAR:  Yes  COMPLEX SIMULATION: At the end of the procedure, an anterior radiograph of the pelvis was obtained to document seed positioning and count. Cystoscopy was performed by the urologist to check the urethra  and bladder.  MICRODOSIMETRY: At the end of the procedure, the patient was emitting 0.012 mR/hr at 1 meter. Accordingly, he was considered safe for hospital discharge.  PLAN: The patient will return to the radiation oncology clinic for post implant CT dosimetry in three weeks.   ________________________________  Sheral Apley Tammi Klippel, M.D.

## 2022-04-13 ENCOUNTER — Encounter (HOSPITAL_COMMUNITY): Payer: Self-pay | Admitting: Urology

## 2022-04-13 NOTE — Op Note (Signed)
PATIENT:  Alec Bruce  PRE-OPERATIVE DIAGNOSIS:  Adenocarcinoma of the prostate  POST-OPERATIVE DIAGNOSIS:  Same  PROCEDURE:  Procedure(s): 1. I-125 radioactive seed implantation 2. SpaceOAR implantation. 3.  Cystoscopy  SURGEON:  Surgeon(s): Irine Seal MD  Radiation oncologist: Dr. Tyler Pita  ANESTHESIA:  General  EBL:  Minimal  DRAINS: 12 French Foley catheter  INDICATION: [name] is a 67 y.o. with Stage T1c, Gleason 7(3+4) prostate cancer who has elected brachytherapy for treatment.  Description of procedure: After informed consent the patient was brought to the major OR, placed on the table and administered general anesthesia. He was then moved to the modified lithotomy position with his perineum perpendicular to the floor. His perineum and genitalia were then sterilely prepped. An official timeout was then performed. A 16 French Foley catheter was then placed in the bladder and filled with dilute contrast, a rectal tube was placed in the rectum and the transrectal ultrasound probe was placed in the rectum and affixed to the stand. He was then sterilely draped.  The sterile grid was installed.   Anchor needles were then placed.   Real time ultrasonography was used along with the seed planning software spot-pro version 3.1-00. This was used to develop the seed plan including the number of needles as well as number of seeds required for complete and adequate coverage. Real-time ultrasonography was then used along with the previously developed plan  to implant a total of 64 seeds using 16 needles for a target dose of 145 Gy. This proceeded without difficulty or complication.  The anchor needles and guide were removed and the SpaceOAR needle was passed under US guidance into the fat stripe posterior to the prostate with the tip in the midline at mid prostate. A puff of NS confirmed appropriate positioning and the SpaceOAR Vue polymer was then injected over 14 seconds  into the space with excellent distribution.     A Foley catheter was then removed as well as the transrectal ultrasound probe and rectal probe. Flexible cystoscopy was then performed using the 17 French flexible scope which revealed a normal urethra throughout its length down to the sphincter which appeared intact. The prostatic urethra was 2-3 cm with lateral lobe hyperplasia. The bladder was then entered and fully and systematically inspected.  The ureteral orifices were noted to be of normal configuration and position. The mucosa revealed no evidence of tumors. There were also no stones identified within the bladder.  No seeds or spacers were seen and/or removed from the bladder.  The cystoscope was then removed.  The drapes were removed.  The perineum was cleaned and dressed.  He was taken out of the lithotomy position and was awakened and taken to recovery room in stable and satisfactory condition. He tolerated procedure well and there were no intraoperative complications.

## 2022-04-14 NOTE — Progress Notes (Signed)
RN spoke with patient to follow up since his brachytherapy procedure from 04/10/22.   Patient reports minimal pain and discomfort, and feels well overall.  Pt plans on resuming his walking regiment, and RN encouraged him to do so.    RN provided education on PSA monitoring post treatment, verbalized understanding.   No additional needs at this time.

## 2022-04-15 DIAGNOSIS — E113512 Type 2 diabetes mellitus with proliferative diabetic retinopathy with macular edema, left eye: Secondary | ICD-10-CM | POA: Diagnosis not present

## 2022-04-28 DIAGNOSIS — E1129 Type 2 diabetes mellitus with other diabetic kidney complication: Secondary | ICD-10-CM | POA: Diagnosis not present

## 2022-04-28 DIAGNOSIS — E1122 Type 2 diabetes mellitus with diabetic chronic kidney disease: Secondary | ICD-10-CM | POA: Diagnosis not present

## 2022-04-28 DIAGNOSIS — N189 Chronic kidney disease, unspecified: Secondary | ICD-10-CM | POA: Diagnosis not present

## 2022-04-28 DIAGNOSIS — D638 Anemia in other chronic diseases classified elsewhere: Secondary | ICD-10-CM | POA: Diagnosis not present

## 2022-04-28 DIAGNOSIS — R809 Proteinuria, unspecified: Secondary | ICD-10-CM | POA: Diagnosis not present

## 2022-04-28 NOTE — Progress Notes (Signed)
Radiation Oncology         [phone] ________________________________  Name: Alec Bruce MRN: [id number]  Date: 04/30/2022  DOB: July 12, 1955  Post-Seed Follow-Up Visit Note  CC: Celene Squibb, MD  Raynelle Bring, MD  Diagnosis:   67 y.o. gentleman with stage T1c adenocarcinoma of the prostate with a Gleason's score of 3+4 and a PSA of 15.3     ICD-10-CM   1. Prostate cancer ([place])  C61       Interval Since Last Radiation:  3 weeks 04/10/22:  Insertion of radioactive I-125 seeds into the prostate gland; 145 Gy, definitive therapy with placement of SpaceOAR gel.  Narrative:  The patient returns today for routine follow-up.  He is complaining of increased urinary frequency and urinary hesitation symptoms. He filled out a questionnaire regarding urinary function today providing and overall IPSS score of 19 characterizing his symptoms as moderate-severe with weak flow of stream, intermittency, frequency, urgency and feelings of incomplete bladder emptying. He specifically denies dysuria, gross hematuria, straining to void or incontinence and does feel like his symptoms are gradually improving.  His pre-implant score was 5. He denies any abdominal pain or bowel symptoms and reports a healthy appetite. He has not noticed any significant change in his energy level and overall, is quite pleased with his progress to date.  ALLERGIES:  has No Known Allergies.  Meds: Current Outpatient Medications  Medication Sig Dispense Refill   aspirin EC 81 MG tablet Take 81 mg by mouth in the morning. Swallow whole.     atorvastatin (LIPITOR) 40 MG tablet Take 1 tablet (40 mg total) by mouth every evening. For stroke prevention (Patient taking differently: Take 40 mg by mouth every evening. For stroke prevention) 30 tablet 11   Cholecalciferol ([employer] VIT D 5000 HIGH-POTENCY PO) Take 5,000 Units by mouth in the morning.     DULoxetine (CYMBALTA) 60 MG capsule Take 60 mg by mouth at bedtime.     ferrous  sulfate 325 (65 FE) MG tablet Take 325 mg by mouth in the morning and at bedtime.     glucose 4 GM chewable tablet Chew 1 tablet by mouth once as needed for low blood sugar.     HYDROcodone-acetaminophen (NORCO/VICODIN) 5-325 MG tablet Take 1 tablet by mouth every 4 (four) hours as needed for moderate pain. 6 tablet 0   insulin glargine (LANTUS) 100 UNIT/ML injection Inject 10 Units into the skin at bedtime.     latanoprost (XALATAN) 0.005 % ophthalmic solution Place 1 drop into both eyes at bedtime.     metoprolol succinate (TOPROL XL) 25 MG 24 hr tablet Take 1 tablet (25 mg total) by mouth daily. (Patient taking differently: Take 25 mg by mouth in the morning.) 30 tablet 11   potassium citrate (UROCIT-K) 10 MEQ (1080 MG) SR tablet Take 10 mEq by mouth 3 (three) times daily with meals.     sitaGLIPtin (JANUVIA) 50 MG tablet Take 50 mg by mouth daily after lunch.     No current facility-administered medications for this visit.    Physical Findings: In general this is a well appearing African American male in no acute distress. He's alert and oriented x4 and appropriate throughout the examination. Cardiopulmonary assessment is negative for acute distress and he exhibits normal effort.   Lab Findings: Lab Results  Component Value Date   WBC 5.2 10/21/2021   HGB 11.9 (L) 04/10/2022   HCT 35.0 (L) 04/10/2022   MCV 94.1 10/21/2021   PLT 193  10/21/2021    Radiographic Findings:  Patient underwent CT imaging in our clinic for post implant dosimetry. The CT will be reviewed by Dr. Tammi Klippel to confirm there is an adequate distribution of radioactive seeds throughout the prostate gland and ensure that there are no seeds in or near the rectum.  We suspect the final radiation plan and dosimetry will show appropriate coverage of the prostate gland. He understands that we will call and inform him of any unexpected findings on further review of his imaging and dosimetry.  Impression/Plan: 67 y.o.  gentleman with stage T1c adenocarcinoma of the prostate with a Gleason's score of 3+4 and a PSA of 15.3  The patient is recovering from the effects of radiation. His urinary symptoms should gradually improve over the next 4-6 months. We talked about this today. He is encouraged by his improvement already and is otherwise pleased with his outcome. We also talked about long-term follow-up for prostate cancer following seed implant. He understands that ongoing PSA determinations and digital rectal exams will help perform surveillance to rule out disease recurrence. He has a follow up appointment scheduled with Dr. Jeffie Pollock on 05/07/2022. He understands what to expect with his PSA measures. Patient was also educated today about some of the long-term effects from radiation including a small risk for rectal bleeding and possibly erectile dysfunction. We talked about some of the general management approaches to these potential complications. However, I did encourage the patient to contact our office or return at any point if he has questions or concerns related to his previous radiation and prostate cancer.    Nicholos Johns, PA-C

## 2022-04-28 NOTE — Progress Notes (Signed)
  Radiation Oncology         [phone]) [phone] ________________________________  Name: Alec Bruce MRN: [id number]  Date: 04/30/2022  DOB: 10-20-1955  COMPLEX SIMULATION NOTE  NARRATIVE:  The patient was brought to the [place] [facility] today following prostate seed implantation approximately one month ago.  Identity was confirmed.  All relevant records and images related to the planned course of therapy were reviewed.  Then, the patient was set-up supine.  CT images were obtained.  The CT images were loaded into the planning software.  Then the prostate and rectum were contoured.  Treatment planning then occurred.  The implanted iodine 125 seeds were identified by the physics staff for projection of radiation distribution  I have requested : 3D Simulation  I have requested a DVH of the following structures: Prostate and rectum.    ________________________________  Sheral Apley Tammi Klippel, M.D.

## 2022-04-29 ENCOUNTER — Telehealth: Payer: Self-pay | Admitting: *Deleted

## 2022-04-29 NOTE — Telephone Encounter (Signed)
CALLED PATIENT TO  REMIND OF POST SEED APPTS. FOR 04-30-22, LVM FOR A RETURN CALL

## 2022-04-30 ENCOUNTER — Encounter: Payer: Self-pay | Admitting: Urology

## 2022-04-30 ENCOUNTER — Ambulatory Visit
Admission: RE | Admit: 2022-04-30 | Discharge: 2022-04-30 | Disposition: A | Discharge status: Home / Self Care | Payer: Medicare Other | Source: Ambulatory Visit | Attending: Radiation Oncology | Admitting: Radiation Oncology

## 2022-04-30 ENCOUNTER — Ambulatory Visit
Admission: RE | Admit: 2022-04-30 | Discharge: 2022-04-30 | Disposition: A | Discharge status: Home / Self Care | Payer: Medicare Other | Source: Ambulatory Visit | Attending: Urology | Admitting: Urology

## 2022-04-30 VITALS — BP 140/78 | HR 96 | Temp 97.6°F | Resp 20 | Ht 72.0 in | Wt 155.6 lb

## 2022-04-30 DIAGNOSIS — R350 Frequency of micturition: Secondary | ICD-10-CM | POA: Diagnosis not present

## 2022-04-30 DIAGNOSIS — C61 Malignant neoplasm of prostate: Secondary | ICD-10-CM | POA: Insufficient documentation

## 2022-04-30 DIAGNOSIS — Z7984 Long term (current) use of oral hypoglycemic drugs: Secondary | ICD-10-CM | POA: Diagnosis not present

## 2022-04-30 DIAGNOSIS — Z79899 Other long term (current) drug therapy: Secondary | ICD-10-CM | POA: Diagnosis not present

## 2022-04-30 DIAGNOSIS — Z923 Personal history of irradiation: Secondary | ICD-10-CM | POA: Diagnosis not present

## 2022-04-30 NOTE — Progress Notes (Signed)
Post-seed nursing interview for a diagnosis of 67 y.o. gentleman with stage T1c adenocarcinoma of the prostate with a Gleason's score of 3+4 and a PSA of 15.3.  Patient identity verified. Patient reports polyuria, and incomplete emptying of the bladder. No other issues conveyed at this time.  Meaningful use complete. I-PSS score- 19 - Moderate SHIM score- 1 NO sexual activity within 6 months. Urinary Management medication(s) None Urology appointment date- [place] with Dr. 05/07/2022 at [facility] [place]- BP (!) 140/78 (BP Location: Left Arm, Patient Position: Sitting, Cuff Size: Large)   Pulse 96   Temp 97.6 F (36.4 C) (Temporal)   Resp 20   Ht 6' (1.829 m)   Wt 155 lb 9.6 oz (70.6 kg)   SpO2 100%   BMI 21.10 kg/m   This concludes the interview.   Leandra Kern, LPN

## 2022-05-01 ENCOUNTER — Encounter: Payer: Self-pay | Admitting: Radiation Oncology

## 2022-05-01 DIAGNOSIS — C61 Malignant neoplasm of prostate: Secondary | ICD-10-CM | POA: Insufficient documentation

## 2022-05-01 DIAGNOSIS — Z191 Hormone sensitive malignancy status: Secondary | ICD-10-CM | POA: Diagnosis not present

## 2022-05-01 NOTE — Progress Notes (Signed)
  Radiation Oncology         [phone]) [phone] ________________________________  Name: Alec Bruce MRN: [id number]  Date: 05/01/2022  DOB: September 12, 1955  3D Planning Note   Prostate Brachytherapy Post-Implant Dosimetry  Diagnosis: 67 y.o. gentleman with stage T1c adenocarcinoma of the prostate with a Gleason's score of 3+4 and a PSA of 15.3  Narrative: On a previous date, [name] returned following prostate seed implantation for post implant planning. He underwent CT scan complex simulation to delineate the three-dimensional structures of the pelvis and demonstrate the radiation distribution.  Since that time, the seed localization, and complex isodose planning with dose volume histograms have now been completed.  Results:   Prostate Coverage - The dose of radiation delivered to the 90% or more of the prostate gland (D90) was 110.94% of the prescription dose. This exceeds our goal of greater than 90%. Rectal Sparing - The volume of rectal tissue receiving the prescription dose or higher was 0.0 cc. This falls under our thresholds tolerance of 1.0 cc.  Impression: The prostate seed implant appears to show adequate target coverage and appropriate rectal sparing.  Plan:  The patient will continue to follow with urology for ongoing PSA determinations. I would anticipate a high likelihood for local tumor control with minimal risk for rectal morbidity.  ________________________________  Sheral Apley Tammi Klippel, M.D.

## 2022-05-07 ENCOUNTER — Encounter: Payer: Self-pay | Admitting: Urology

## 2022-05-07 ENCOUNTER — Ambulatory Visit: Payer: Medicare Other | Admitting: Urology

## 2022-05-07 VITALS — BP 147/78 | HR 94 | Ht 72.0 in | Wt 159.0 lb

## 2022-05-07 DIAGNOSIS — R809 Proteinuria, unspecified: Secondary | ICD-10-CM | POA: Diagnosis not present

## 2022-05-07 DIAGNOSIS — E875 Hyperkalemia: Secondary | ICD-10-CM | POA: Diagnosis not present

## 2022-05-07 DIAGNOSIS — E1129 Type 2 diabetes mellitus with other diabetic kidney complication: Secondary | ICD-10-CM | POA: Diagnosis not present

## 2022-05-07 DIAGNOSIS — I129 Hypertensive chronic kidney disease with stage 1 through stage 4 chronic kidney disease, or unspecified chronic kidney disease: Secondary | ICD-10-CM | POA: Diagnosis not present

## 2022-05-07 DIAGNOSIS — N189 Chronic kidney disease, unspecified: Secondary | ICD-10-CM | POA: Diagnosis not present

## 2022-05-07 DIAGNOSIS — N401 Enlarged prostate with lower urinary tract symptoms: Secondary | ICD-10-CM

## 2022-05-07 DIAGNOSIS — E1122 Type 2 diabetes mellitus with diabetic chronic kidney disease: Secondary | ICD-10-CM | POA: Diagnosis not present

## 2022-05-07 DIAGNOSIS — C61 Malignant neoplasm of prostate: Secondary | ICD-10-CM

## 2022-05-07 DIAGNOSIS — D638 Anemia in other chronic diseases classified elsewhere: Secondary | ICD-10-CM | POA: Diagnosis not present

## 2022-05-07 DIAGNOSIS — R3912 Poor urinary stream: Secondary | ICD-10-CM

## 2022-05-07 DIAGNOSIS — N138 Other obstructive and reflux uropathy: Secondary | ICD-10-CM

## 2022-05-07 DIAGNOSIS — R82991 Hypocitraturia: Secondary | ICD-10-CM | POA: Diagnosis not present

## 2022-05-07 DIAGNOSIS — N200 Calculus of kidney: Secondary | ICD-10-CM | POA: Diagnosis not present

## 2022-05-07 LAB — MICROSCOPIC EXAMINATION: Bacteria, UA: NONE SEEN

## 2022-05-07 LAB — URINALYSIS, ROUTINE W REFLEX MICROSCOPIC
Bilirubin, UA: NEGATIVE
Glucose, UA: NEGATIVE
Ketones, UA: NEGATIVE
Leukocytes,UA: NEGATIVE
Nitrite, UA: NEGATIVE
Protein,UA: NEGATIVE
Specific Gravity, UA: 1.015 (ref 1.005–1.030)
Urobilinogen, Ur: 0.2 mg/dL (ref 0.2–1.0)
pH, UA: 5 (ref 5.0–7.5)

## 2022-05-07 NOTE — Progress Notes (Signed)
Subjective: 1. Prostate cancer   2. BPH with urinary obstruction   3. Weak urinary stream      05/07/22: Alec Bruce returns today in f/u from a seed implant on 04/10/22.  He is doing well with mild/mod LUTS with some urgency and a reduced stream.  He can have some UUI but it is improving.  He has no hematuria. He has no GI complaints.  His UA is clear.     01/15/22: Alec Bruce returns today in f/u.  He had a KUB on 12/04/21 following left ureteroscopy on 10/22/21 and only had a small right renal stone. A renal US on 11/27/21 showed on hydro and only a 2.5cm left renal cyst.  He was seen in the [facility] for his recent prostate cancer and he is interested in pursuing a seed implant.    He has no flank pain or hematuria and his IPSS is 1. UA today has 3-10 RBC's.   10/30/21: Alec Bruce returns today in f/u from his recent left ureteroscopy and prostate biopsy.   He has some hypotension since he got started on the tamsulosin.  He has had no fever or flank pain.  His biopsy showed a 44ml prostate with GG1 in 10% of the RAL core and GG2 in 5% of the LAL core.  His prebiopsy PSA was 15.  The [facility] nomogram predicts 53% OCD, 44% ECE, 5% LNI and 4% SVI.  He doesn't need staging studies.  He has mild LUTS but severe ED.   10/02/21: [name] returns today in f/u for his history of an elevated PSA and microhematuria with history of stones.  He had a PSA on 8/3 that was up further to 15.3 and had a CT stone study on 09/18/21 that showed a 2mm left UVJ stone without obstruction. The stone was in the [place] in 2022.  He has no flank pain or gross hematuria.  He has no urgency.    He has some increased frequency but has increased his fluid intake.   09/04/21: Alec Bruce is a 67 yo male who is sent for a recent PSA elevation of 10. He has not had a rectal exam.  He has pass a couple of kidney stones but has no other GU history. He has a reduced stream with some intermittency and post void dribbling.  He has had no hematuria or dysuria. He  has had no other recent acute ailments.  He has ED.   He has DM with neuropathy and CKD3b as well as a history of colon cancer with a partial colectomy in the 1990s.  He has 11-30 RBC's on UA today.  He had a CT stone study in 6/22 that showed non-obstructing renal stones.   ROS:  Review of Systems  All other systems reviewed and are negative.   No Known Allergies  Past Medical History:  Diagnosis Date   Anemia, chronic disease    BPH with urinary obstruction    Chronic metabolic acidosis    CKD (chronic kidney disease), stage III    nephrologist--- dr Theador Hawthorne;   History of cancer chemotherapy    colon cancer 09/ 1990 completed 1 yr 1991   History of colon cancer in adulthood 09/1988   s/p  subtotal  colectomy w/ node dissection's w/ 3 positive nodes,  completed 1 yr chemo in 1991,  as of 02/ 2023 no recurrence since   History of hyperthyroidism 2004   s/p RAI 01-22-2003 multinodular goitor   History of kidney stones    History  of TIA (transient ischemic attack) 03/20/2019   no residual   Hyperlipidemia    Hypertension    Hypocitraturia    Malignant neoplasm prostate 10/2021   urologist--- dr Kindrick Lankford/  radiation oncologist--- dr Tammi Klippel;  dx 09/ 2023,  Gleason 3+4, PSA 15.4   Multinodular goiter    Nephrolithiasis    Peripheral neuropathy    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    Secondary hyperparathyroidism of renal origin    Type 2 diabetes mellitus treated with insulin    followed by pcp;   (04-07-2022  per pt check blood sugar daily am fasting ,  average 102)   Wears glasses     Past Surgical History:  Procedure Laterality Date   APPENDECTOMY  1966   CATARACT EXTRACTION W/ INTRAOCULAR LENS IMPLANT Left 2022   COLONOSCOPY WITH PROPOFOL N/A 03/31/2021   Procedure: COLONOSCOPY WITH PROPOFOL;  Surgeon: Eloise Harman, DO;  Location: [facility];  Service: Endoscopy;  Laterality: N/A;  10:00 / ASA 3   CYSTOSCOPY WITH RETROGRADE PYELOGRAM, URETEROSCOPY AND  STENT PLACEMENT Left 10/22/2021   Procedure: CYSTOSCOPY WITH RETROGRADE PYELOGRAM, URETEROSCOPY AND STENT PLACEMENT;  Surgeon: Primus Bravo., MD;  Location: [facility];  Service: Urology;  Laterality: Left;   HOLMIUM LASER APPLICATION Left [id number]   Procedure: HOLMIUM LASER APPLICATION;  Surgeon: Primus Bravo., MD;  Location: [facility];  Service: Urology;  Laterality: Left;   POLYPECTOMY  03/31/2021   Procedure: POLYPECTOMY;  Surgeon: Eloise Harman, DO;  Location: [facility];  Service: Endoscopy;;   PROSTATE BIOPSY N/A 10/22/2021   Procedure: PROSTATE BIOPSY;  Surgeon: Primus Bravo., MD;  Location: [facility];  Service: Urology;  Laterality: N/A;   RADIOACTIVE SEED IMPLANT N/A 04/10/2022   Procedure: RADIOACTIVE SEED IMPLANT/BRACHYTHERAPY IMPLANT;  Surgeon: Irine Seal, MD;  Location: [facility];  Service: Urology;  Laterality: N/A;   SPACE OAR INSTILLATION N/A 04/10/2022   Procedure: SPACE OAR INSTILLATION;  Surgeon: Irine Seal, MD;  Location: [facility];  Service: Urology;  Laterality: N/A;   SUBTOTAL COLECTOMY  09/1988   @[facility]  by dr s. Sherolyn Buba;    w/ lymph node dissection's    Social History   Socioeconomic History   Marital status: Single    Spouse name: Not on file   Number of children: Not on file   Years of education: Not on file   Highest education level: Not on file  Occupational History   Not on file  Tobacco Use   Smoking status: Former    Years: 10    Types: Cigarettes    Quit date: 43    Years since quitting: 38.2   Smokeless tobacco: Never  Vaping Use   Vaping Use: Never used  Substance and Sexual Activity   Alcohol use: Not Currently   Drug use: Not Currently    Types: Marijuana    Comment: 04-07-2022 per pt last used 1990s   Sexual activity: Not on file  Other Topics Concern   Not on file  Social History Narrative   Not on file   Social Determinants of Health   Financial Resource Strain: Not on file  Food Insecurity: No Food Insecurity  (04/30/2022)   Hunger Vital Sign    Worried About Running Out of Food in the Last Year: Never true    Ran Out of Food in the Last Year: Never true  Transportation Needs: No Transportation Needs (04/30/2022)   PRAPARE - Transportation    Lack of Transportation (  Medical): No    Lack of Transportation (Non-Medical): No  Physical Activity: Not on file  Stress: Not on file  Social Connections: Not on file  Intimate Partner Violence: Not At Risk (04/30/2022)   Humiliation, Afraid, Rape, and Kick questionnaire    Fear of Current or Ex-Partner: No    Emotionally Abused: No    Physically Abused: No    Sexually Abused: No    Family History  Problem Relation Age of Onset   Colon cancer Mother 25 - 71   Colon cancer Cousin     Anti-infectives: Anti-infectives (From admission, onward)    None       Current Outpatient Medications  Medication Sig Dispense Refill   aspirin EC 81 MG tablet Take 81 mg by mouth in the morning. Swallow whole.     atorvastatin (LIPITOR) 40 MG tablet Take 1 tablet (40 mg total) by mouth every evening. For stroke prevention (Patient taking differently: Take 40 mg by mouth every evening. For stroke prevention) 30 tablet 11   Cholecalciferol ([employer] VIT D 5000 HIGH-POTENCY PO) Take 5,000 Units by mouth in the morning.     DULoxetine (CYMBALTA) 60 MG capsule Take 60 mg by mouth at bedtime.     ferrous sulfate 325 (65 FE) MG tablet Take 325 mg by mouth in the morning and at bedtime.     glucose 4 GM chewable tablet Chew 1 tablet by mouth once as needed for low blood sugar.     HYDROcodone-acetaminophen (NORCO/VICODIN) 5-325 MG tablet Take 1 tablet by mouth every 4 (four) hours as needed for moderate pain. 6 tablet 0   insulin glargine (LANTUS) 100 UNIT/ML injection Inject 10 Units into the skin at bedtime.     latanoprost (XALATAN) 0.005 % ophthalmic solution Place 1 drop into both eyes at bedtime.     potassium citrate (UROCIT-K) 10 MEQ (1080 MG) SR tablet Take 10 mEq by  mouth 3 (three) times daily with meals.     sitaGLIPtin (JANUVIA) 50 MG tablet Take 50 mg by mouth daily after lunch.     metoprolol succinate (TOPROL XL) 25 MG 24 hr tablet Take 1 tablet (25 mg total) by mouth daily. (Patient taking differently: Take 25 mg by mouth in the morning.) 30 tablet 11   No current facility-administered medications for this visit.     Objective: Vital signs in last 24 hours: BP (!) 147/78   Pulse 94   Ht 6' (1.829 m)   Wt 159 lb (72.1 kg)   BMI 21.56 kg/m   Intake/Output from previous day: No intake/output data recorded. Intake/Output this shift: @IOTHISSHIFT @   Physical Exam Vitals reviewed.  Constitutional:      Appearance: Normal appearance.  Neurological:     Mental Status: He is alert.     Lab Results:  Results for orders placed or performed in visit on 05/07/22 (from the past 24 hour(s))  Urinalysis, Routine w reflex microscopic     Status: Abnormal   Collection Time: 05/07/22 10:49 AM  Result Value Ref Range   Specific Gravity, UA 1.015 1.005 - 1.030   pH, UA 5.0 5.0 - 7.5   Color, UA Yellow Yellow   Appearance Ur Clear Clear   Leukocytes,UA Negative Negative   Protein,UA Negative Negative/Trace   Glucose, UA Negative Negative   Ketones, UA Negative Negative   RBC, UA Trace (A) Negative   Bilirubin, UA Negative Negative   Urobilinogen, Ur 0.2 0.2 - 1.0 mg/dL   Nitrite, UA Negative  Negative   Microscopic Examination See below:    Narrative   Performed at:  [address] - [facility] [address] [place], [place]  [id number] Lab Director: Chinita Pester MT, Phone:  [phone]  Microscopic Examination     Status: None   Collection Time: 05/07/22 10:49 AM   Urine  Result Value Ref Range   WBC, UA 0-5 0 - 5 /hpf   RBC, Urine 0-2 0 - 2 /hpf   Epithelial Cells (non renal) 0-10 0 - 10 /hpf   Bacteria, UA None seen None seen/Few   Narrative   Performed at:  [address] - [facility]  [address], [place], [place]   [id number] Lab Director: Chinita Pester MT, Phone:  [phone]      BMET No results for input(s): "NA", "K", "CL", "CO2", "GLUCOSE", "BUN", "CREATININE", "CALCIUM" in the last 72 hours.  PT/INR No results for input(s): "LABPROT", "INR" in the last 72 hours. ABG No results for input(s): "PHART", "HCO3" in the last 72 hours.  Invalid input(s): "PCO2", "PO2" UA has only trace heme.   Studies/Results: No results found. DG C-Arm 1-60 Min-No Report  Result Date: 04/10/2022 Fluoroscopy was utilized by the requesting physician.  No radiographic interpretation.      Assessment/Plan:  Prostate cancer.   He has T1c Nx Mx GG2 intermediate risk prostate cancer. He is doing well s/p seed implant and will return in 3 months with a PSA.  BPH with BOO.  He has moderate LUTS but they are improving.   No orders of the defined types were placed in this encounter.    Orders Placed This Encounter  Procedures   Microscopic Examination   Urinalysis, Routine w reflex microscopic   PSA    Standing Status:   Future    Standing Expiration Date:   11/06/2022     Return in about 3 months (around 08/06/2022).    CC: Dr. Benita Stabile.      Bjorn Pippin 05/08/2022

## 2022-05-28 ENCOUNTER — Telehealth: Payer: Self-pay | Admitting: *Deleted

## 2022-05-28 NOTE — Telephone Encounter (Signed)
Rescheduled appointment per 4/25 secure chat. Patient is aware of the changes made to his upcoming appointment.

## 2022-05-29 ENCOUNTER — Inpatient Hospital Stay: Payer: Medicare Other | Admitting: *Deleted

## 2022-06-01 ENCOUNTER — Inpatient Hospital Stay: Payer: Medicare Other | Attending: Nurse Practitioner | Admitting: *Deleted

## 2022-06-01 ENCOUNTER — Telehealth: Payer: Self-pay

## 2022-06-01 ENCOUNTER — Encounter: Payer: Self-pay | Admitting: *Deleted

## 2022-06-01 DIAGNOSIS — C61 Malignant neoplasm of prostate: Secondary | ICD-10-CM

## 2022-06-01 NOTE — Telephone Encounter (Signed)
Tried calling patient with no answer for PSA lab apptointment. PSA labs scheduled

## 2022-06-01 NOTE — Progress Notes (Signed)
2 Identifiers were used for verification purposes. No vitals were taken as this was not an in-person visit. Allergies and medication reviewed and reconciled. Pt denies pain and fatigue today.

## 2022-06-02 DIAGNOSIS — H468 Other optic neuritis: Secondary | ICD-10-CM | POA: Diagnosis not present

## 2022-06-02 DIAGNOSIS — H2513 Age-related nuclear cataract, bilateral: Secondary | ICD-10-CM | POA: Diagnosis not present

## 2022-06-02 DIAGNOSIS — H401134 Primary open-angle glaucoma, bilateral, indeterminate stage: Secondary | ICD-10-CM | POA: Diagnosis not present

## 2022-06-02 DIAGNOSIS — E113512 Type 2 diabetes mellitus with proliferative diabetic retinopathy with macular edema, left eye: Secondary | ICD-10-CM | POA: Diagnosis not present

## 2022-06-02 NOTE — Progress Notes (Signed)
Pt states he has returned to normal bladder habits as far as the number of times to the bathroom at night. Most nights he may get up once . His frequency and urgency has calmed down as well since treatment. Pt's urine stream is almost back to normal, he says. No changes to his bowel habits. Last colonoscopy was 03/31/2021. Next visit with urologist will be in July.

## 2022-06-11 DIAGNOSIS — E1165 Type 2 diabetes mellitus with hyperglycemia: Secondary | ICD-10-CM | POA: Diagnosis not present

## 2022-06-11 DIAGNOSIS — E079 Disorder of thyroid, unspecified: Secondary | ICD-10-CM | POA: Diagnosis not present

## 2022-06-11 DIAGNOSIS — E785 Hyperlipidemia, unspecified: Secondary | ICD-10-CM | POA: Diagnosis not present

## 2022-06-16 DIAGNOSIS — E1165 Type 2 diabetes mellitus with hyperglycemia: Secondary | ICD-10-CM | POA: Diagnosis not present

## 2022-06-16 DIAGNOSIS — R7401 Elevation of levels of liver transaminase levels: Secondary | ICD-10-CM | POA: Diagnosis not present

## 2022-06-16 DIAGNOSIS — H409 Unspecified glaucoma: Secondary | ICD-10-CM | POA: Diagnosis not present

## 2022-06-16 DIAGNOSIS — E1122 Type 2 diabetes mellitus with diabetic chronic kidney disease: Secondary | ICD-10-CM | POA: Diagnosis not present

## 2022-06-16 DIAGNOSIS — F32A Depression, unspecified: Secondary | ICD-10-CM | POA: Diagnosis not present

## 2022-06-16 DIAGNOSIS — D649 Anemia, unspecified: Secondary | ICD-10-CM | POA: Diagnosis not present

## 2022-06-16 DIAGNOSIS — K769 Liver disease, unspecified: Secondary | ICD-10-CM | POA: Diagnosis not present

## 2022-06-16 DIAGNOSIS — E079 Disorder of thyroid, unspecified: Secondary | ICD-10-CM | POA: Diagnosis not present

## 2022-06-16 DIAGNOSIS — E785 Hyperlipidemia, unspecified: Secondary | ICD-10-CM | POA: Diagnosis not present

## 2022-06-16 DIAGNOSIS — I129 Hypertensive chronic kidney disease with stage 1 through stage 4 chronic kidney disease, or unspecified chronic kidney disease: Secondary | ICD-10-CM | POA: Diagnosis not present

## 2022-06-16 DIAGNOSIS — N1832 Chronic kidney disease, stage 3b: Secondary | ICD-10-CM | POA: Diagnosis not present

## 2022-06-23 DIAGNOSIS — E1165 Type 2 diabetes mellitus with hyperglycemia: Secondary | ICD-10-CM | POA: Diagnosis not present

## 2022-06-24 DIAGNOSIS — E113591 Type 2 diabetes mellitus with proliferative diabetic retinopathy without macular edema, right eye: Secondary | ICD-10-CM | POA: Diagnosis not present

## 2022-06-24 DIAGNOSIS — E113512 Type 2 diabetes mellitus with proliferative diabetic retinopathy with macular edema, left eye: Secondary | ICD-10-CM | POA: Diagnosis not present

## 2022-07-01 ENCOUNTER — Ambulatory Visit: Payer: Medicare Other | Admitting: Podiatry

## 2022-07-01 ENCOUNTER — Encounter: Payer: Self-pay | Admitting: Podiatry

## 2022-07-01 DIAGNOSIS — M79674 Pain in right toe(s): Secondary | ICD-10-CM | POA: Diagnosis not present

## 2022-07-01 DIAGNOSIS — E1165 Type 2 diabetes mellitus with hyperglycemia: Secondary | ICD-10-CM

## 2022-07-01 DIAGNOSIS — B351 Tinea unguium: Secondary | ICD-10-CM | POA: Diagnosis not present

## 2022-07-01 DIAGNOSIS — M79675 Pain in left toe(s): Secondary | ICD-10-CM | POA: Diagnosis not present

## 2022-07-01 NOTE — Progress Notes (Signed)
Subjective:  Patient ID: Alec Bruce, male    DOB: Mar 04, 1955,   MRN: [id number]  Chief Complaint  Patient presents with   Nail Problem    Rm 22 RFC bilateral nail trim. Pt is diabetic and needs to establish routine care. Pt states his great nails are thick and discolored. Has tried otc antifungals for treatment that did not help.     67 y.o. male presents for concern of thickened elongated and painful nails that are difficult to trim. Requesting to have them trimmed today. Relates burning and tingling in their feet. Patient is diabetic and last A1c was  Lab Results  Component Value Date   HGBA1C 8.1 (H) 04/10/2022   .   PCP:  Benita Stabile, MD    . Denies any other pedal complaints. Denies n/v/f/c.   Past Medical History:  Diagnosis Date   Anemia, chronic disease    BPH with urinary obstruction    Chronic metabolic acidosis    CKD (chronic kidney disease), stage III [facility])    nephrologist--- dr Wolfgang Phoenix;   History of cancer chemotherapy    colon cancer 09/ 1990 completed 1 yr 1991   History of colon cancer in adulthood 09/1988   s/p  subtotal  colectomy w/ node dissection's w/ 3 positive nodes,  completed 1 yr chemo in 1991,  as of 02/ 2023 no recurrence since   History of hyperthyroidism 2004   s/p RAI 01-22-2003 multinodular goitor   History of kidney stones    History of TIA (transient ischemic attack) 03/20/2019   no residual   Hyperlipidemia    Hypertension    Hypocitraturia    Malignant neoplasm prostate (HCC) 10/2021   urologist--- dr wrenn/  radiation oncologist--- dr Kathrynn Running;  dx 09/ 2023,  Gleason 3+4, PSA 15.4   Multinodular goiter    Nephrolithiasis    Peripheral neuropathy    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 CFT <3 seconds. Absent hair growth on digits. Edema noted to bilateral lower extremities. Xerosis noted bilaterally.  Skin. No lacerations or abrasions bilateral feet. Nails 1-5 bilateral  are thickened discolored and elongated with subungual debris.  Musculoskeletal: MMT 5/5 bilateral lower extremities in DF, PF, Inversion and Eversion. Deceased ROM in DF of ankle joint.  Neurological: Sensation intact to light touch. Protective sensation diminished bilateral.    Assessment:   1. Pain due to onychomycosis of toenails of both feet   2. Uncontrolled type 2 diabetes mellitus with hyperglycemia, without long-term current use of insulin (HCC)      Plan:  Patient was evaluated and treated and all questions answered. -Discussed and educated patient on diabetic foot care, especially with  regards to the vascular, neurological and musculoskeletal systems.  -Stressed the importance of good glycemic control and the detriment of not  controlling glucose levels in relation to the foot. -Discussed supportive shoes at all times and checking feet regularly.  -Mechanically debrided all nails 1-5 bilateral using sterile nail nipper and filed with dremel without incident  -Answered all patient questions -Patient to return  in 3 months for at risk foot care -Patient advised to call the office  if any problems or questions arise in the meantime.   Louann Sjogren, DPM

## 2022-07-22 DIAGNOSIS — E113591 Type 2 diabetes mellitus with proliferative diabetic retinopathy without macular edema, right eye: Secondary | ICD-10-CM | POA: Diagnosis not present

## 2022-07-24 DIAGNOSIS — E1165 Type 2 diabetes mellitus with hyperglycemia: Secondary | ICD-10-CM | POA: Diagnosis not present

## 2022-08-05 ENCOUNTER — Other Ambulatory Visit: Payer: Medicare Other

## 2022-08-05 DIAGNOSIS — C61 Malignant neoplasm of prostate: Secondary | ICD-10-CM

## 2022-08-06 LAB — PSA: Prostate Specific Ag, Serum: 2.4 ng/mL (ref 0.0–4.0)

## 2022-08-13 ENCOUNTER — Encounter: Payer: Self-pay | Admitting: Urology

## 2022-08-13 ENCOUNTER — Ambulatory Visit: Payer: Medicare Other | Admitting: Urology

## 2022-08-13 VITALS — BP 122/72 | HR 92 | Ht 72.0 in | Wt 159.0 lb

## 2022-08-13 DIAGNOSIS — C61 Malignant neoplasm of prostate: Secondary | ICD-10-CM | POA: Diagnosis not present

## 2022-08-13 DIAGNOSIS — R3912 Poor urinary stream: Secondary | ICD-10-CM

## 2022-08-13 DIAGNOSIS — N138 Other obstructive and reflux uropathy: Secondary | ICD-10-CM | POA: Diagnosis not present

## 2022-08-13 DIAGNOSIS — N5201 Erectile dysfunction due to arterial insufficiency: Secondary | ICD-10-CM

## 2022-08-13 DIAGNOSIS — N200 Calculus of kidney: Secondary | ICD-10-CM

## 2022-08-13 DIAGNOSIS — N401 Enlarged prostate with lower urinary tract symptoms: Secondary | ICD-10-CM

## 2022-08-13 LAB — URINALYSIS, ROUTINE W REFLEX MICROSCOPIC
Bilirubin, UA: NEGATIVE
Glucose, UA: NEGATIVE
Ketones, UA: NEGATIVE
Leukocytes,UA: NEGATIVE
Nitrite, UA: NEGATIVE
Protein,UA: NEGATIVE
RBC, UA: NEGATIVE
Specific Gravity, UA: 1.015 (ref 1.005–1.030)
Urobilinogen, Ur: 0.2 mg/dL (ref 0.2–1.0)
pH, UA: 5.5 (ref 5.0–7.5)

## 2022-08-13 NOTE — Progress Notes (Signed)
Subjective: 1. Prostate cancer (HCC)   2. BPH with urinary obstruction   3. Weak urinary stream   4. Renal stones   5. Erectile dysfunction due to arterial insufficiency      08/13/22: Alec Bruce returns today in f/u for his history of T1c Nx Mx GG2 intermediate risk prostate cancer treated with a seed implant on 04/10/22.  His PSA is down to 2.4 from 15.3 preop.   He has a history of stones as well and had left ureteroscopy last year.  There were small bilateral renal stones on the CT from 9/23 in addition to the treated stone.   He is voiding well with an IPSS of 6 and nocturia x 1.   He has had no flank pain or hematuria.  He has stable ED that has never been treated.  He has no tumescence.    05/07/22: Alec Bruce returns today in f/u from a seed implant on 04/10/22.  He is doing well with mild/mod LUTS with some urgency and a reduced stream.  He can have some UUI but it is improving.  He has no hematuria. He has no GI complaints.  His UA is clear.     01/15/22: Alec Bruce returns today in f/u.  He had a KUB on 12/04/21 following left ureteroscopy on 10/22/21 and only had a small right renal stone. A renal US on 11/27/21 showed on hydro and only a 2.5cm left renal cyst.  He was seen in the [facility] for his recent prostate cancer and he is interested in pursuing a seed implant.    He has no flank pain or hematuria and his IPSS is 1. UA today has 3-10 RBC's.   10/30/21: Alec Bruce returns today in f/u from his recent left ureteroscopy and prostate biopsy.   He has some hypotension since he got started on the tamsulosin.  He has had no fever or flank pain.  His biopsy showed a 38ml prostate with GG1 in 10% of the RAL core and GG2 in 5% of the LAL core.  His prebiopsy PSA was 15.  The [facility] nomogram predicts 53% OCD, 44% ECE, 5% LNI and 4% SVI.  He doesn't need staging studies.  He has mild LUTS but severe ED.   10/02/21: Alec Bruce returns today in f/u for his history of an elevated PSA and microhematuria with history  of stones.  He had a PSA on 8/3 that was up further to 15.3 and had a CT stone study on 09/18/21 that showed a 9mm left UVJ stone without obstruction. The stone was in the [facility] in 2022.  He has no flank pain or gross hematuria.  He has no urgency.    He has some increased frequency but has increased his fluid intake.   09/04/21: Alec Bruce is a 67 yo male who is sent for a recent PSA elevation of 10. He has not had a rectal exam.  He has pass a couple of kidney stones but has no other GU history. He has a reduced stream with some intermittency and post void dribbling.  He has had no hematuria or dysuria. He has had no other recent acute ailments.  He has ED.   He has DM with neuropathy and CKD3b as well as a history of colon cancer with a partial colectomy in the 1990s.  He has 11-30 RBC's on UA today.  He had a CT stone study in 6/22 that showed non-obstructing renal stones.      IPSS     Row  Name 08/13/22 1000         International Prostate Symptom Score   How often have you had the sensation of not emptying your bladder? Less than 1 in 5     How often have you had to urinate less than every two hours? Less than 1 in 5 times     How often have you found you stopped and started again several times when you urinated? Less than 1 in 5 times     How often have you found it difficult to postpone urination? Less than 1 in 5 times     How often have you had a weak urinary stream? Less than 1 in 5 times     How often have you had to strain to start urination? Not at All     How many times did you typically get up at night to urinate? 1 Time     Total IPSS Score 6       Quality of Life due to urinary symptoms   If you were to spend the rest of your life with your urinary condition just the way it is now how would you feel about that? Mostly Satisfied              ROS:  Review of Systems  All other systems reviewed and are negative.   No Known Allergies  Past Medical History:  Diagnosis  Date   Anemia, chronic disease    BPH with urinary obstruction    Chronic metabolic acidosis    CKD (chronic kidney disease), stage III [facility])    nephrologist--- dr Wolfgang Phoenix;   History of cancer chemotherapy    colon cancer 09/ 1990 completed 1 yr 1991   History of colon cancer in adulthood 09/1988   s/p  subtotal  colectomy w/ node dissection's w/ 3 positive nodes,  completed 1 yr chemo in 1991,  as of 02/ 2023 no recurrence since   History of hyperthyroidism 2004   s/p RAI 01-22-2003 multinodular goitor   History of kidney stones    History of TIA (transient ischemic attack) 03/20/2019   no residual   Hyperlipidemia    Hypertension    Hypocitraturia    Malignant neoplasm prostate (HCC) 10/2021   urologist--- dr Sachit Gilman/  radiation oncologist--- dr Kathrynn Running;  dx 09/ 2023,  Gleason 3+4, PSA 15.4   Multinodular goiter    Nephrolithiasis    Peripheral neuropathy    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)    Secondary hyperparathyroidism of renal origin (HCC)    Type 2 diabetes mellitus treated with insulin (HCC)    followed by pcp;   (04-07-2022  per pt check blood sugar daily am fasting ,  average 102)   Wears glasses     Past Surgical History:  Procedure Laterality Date   APPENDECTOMY  1966   CATARACT EXTRACTION W/ INTRAOCULAR LENS IMPLANT Left 2022   COLONOSCOPY WITH PROPOFOL N/A 03/31/2021   Procedure: COLONOSCOPY WITH PROPOFOL;  Surgeon: Lanelle Bal, DO;  [facility]: [facility];  Service: Endoscopy;  Laterality: N/A;  10:00 / ASA 3   CYSTOSCOPY WITH RETROGRADE PYELOGRAM, URETEROSCOPY AND STENT PLACEMENT Left 10/22/2021   Procedure: CYSTOSCOPY WITH RETROGRADE PYELOGRAM, URETEROSCOPY AND STENT PLACEMENT;  Surgeon: Milderd Meager., MD;  Location: [facility];  Service: Urology;  Laterality: Left;   HOLMIUM LASER APPLICATION Left 10/22/2021   Procedure: HOLMIUM LASER APPLICATION;  Surgeon: Milderd Meager., MD;  Location: [facility];  Service:  Urology;  Laterality:  Left;   POLYPECTOMY  03/31/2021   Procedure: POLYPECTOMY;  Surgeon: Lanelle Bal, DO;  Location: [facility];  Service: Endoscopy;;   PROSTATE BIOPSY N/A 10/22/2021   Procedure: PROSTATE BIOPSY;  Surgeon: Milderd Meager., MD;  Location: [facility];  Service: Urology;  Laterality: N/A;   RADIOACTIVE SEED IMPLANT N/A 04/10/2022   Procedure: RADIOACTIVE SEED IMPLANT/BRACHYTHERAPY IMPLANT;  Surgeon: Bjorn Pippin, MD;  Location: [facility];  Service: Urology;  Laterality: N/A;   SPACE OAR INSTILLATION N/A 04/10/2022   Procedure: SPACE OAR INSTILLATION;  Surgeon: Bjorn Pippin, MD;  Location: [facility];  Service: Urology;  Laterality: N/A;   SUBTOTAL COLECTOMY  09/1988   @[facility]  by dr s. Merry Lofty;    w/ lymph node dissection's    Social History   Socioeconomic History   Marital status: Single    Spouse name: Not on file   Number of children: Not on file   Years of education: Not on file   Highest education level: Not on file  Occupational History   Not on file  Tobacco Use   Smoking status: Former    Current packs/day: 0.00    Types: Cigarettes    Start date: 63    Quit date: 31    Years since quitting: 38.5   Smokeless tobacco: Never  Vaping Use   Vaping status: Never Used  Substance and Sexual Activity   Alcohol use: Not Currently   Drug use: Not Currently    Types: Marijuana    Comment: 04-07-2022 per pt last used 1990s   Sexual activity: Not on file  Other Topics Concern   Not on file  Social History Narrative   Not on file   Social Determinants of Health   Financial Resource Strain: Not on file  Food Insecurity: No Food Insecurity (04/30/2022)   Hunger Vital Sign    Worried About Running Out of Food in the Last Year: Never true    Ran Out of Food in the Last Year: Never true  Transportation Needs: No Transportation Needs (04/30/2022)   PRAPARE - Administrator, Civil Service (Medical): No    Lack of Transportation (Non-Medical): No  Physical Activity: Not on  file  Stress: Not on file  Social Connections: Not on file  Intimate Partner Violence: Not At Risk (04/30/2022)   Humiliation, Afraid, Rape, and Kick questionnaire    Fear of Current or Ex-Partner: No    Emotionally Abused: No    Physically Abused: No    Sexually Abused: No    Family History  Problem Relation Age of Onset   Colon cancer Mother 63 - 7   Colon cancer Cousin     Anti-infectives: Anti-infectives (From admission, onward)    None       Current Outpatient Medications  Medication Sig Dispense Refill   aspirin EC 81 MG tablet Take 81 mg by mouth in the morning. Swallow whole.     atorvastatin (LIPITOR) 40 MG tablet Take 1 tablet (40 mg total) by mouth every evening. For stroke prevention (Patient taking differently: Take 40 mg by mouth every evening. For stroke prevention) 30 tablet 11   Cholecalciferol ([employer] VIT D 5000 HIGH-POTENCY PO) Take 5,000 Units by mouth in the morning.     DULoxetine (CYMBALTA) 60 MG capsule Take 60 mg by mouth at bedtime.     ferrous sulfate 325 (65 FE) MG tablet Take 325 mg by mouth in the morning and at bedtime.  glucose 4 GM chewable tablet Chew 1 tablet by mouth once as needed for low blood sugar.     insulin glargine (LANTUS) 100 UNIT/ML injection Inject 10 Units into the skin at bedtime.     latanoprost (XALATAN) 0.005 % ophthalmic solution Place 1 drop into both eyes at bedtime.     sitaGLIPtin (JANUVIA) 50 MG tablet Take 50 mg by mouth daily after lunch.     sodium citrate-citric acid (ORACIT) 500-334 MG/5ML solution Take 15 mLs by mouth 2 (two) times daily. Pt takes morning and bedtime.     metoprolol succinate (TOPROL XL) 25 MG 24 hr tablet Take 1 tablet (25 mg total) by mouth daily. (Patient taking differently: Take 25 mg by mouth in the morning.) 30 tablet 11   No current facility-administered medications for this visit.     Objective: Vital signs in last 24 hours: BP 122/72   Pulse 92   Ht 6' (1.829 m)   Wt 159 lb (72.1  kg)   BMI 21.56 kg/m   Intake/Output from previous day: No intake/output data recorded. Intake/Output this shift: @IOTHISSHIFT @   Physical Exam Vitals reviewed.  Constitutional:      Appearance: Normal appearance.  Neurological:     Mental Status: He is alert.     Lab Results:  Results for orders placed or performed in visit on 08/13/22 (from the past 24 hour(s))  Urinalysis, Routine w reflex microscopic     Status: None   Collection Time: 08/13/22 10:43 AM  Result Value Ref Range   Specific Gravity, UA 1.015 1.005 - 1.030   pH, UA 5.5 5.0 - 7.5   Color, UA Yellow Yellow   Appearance Ur Clear Clear   Leukocytes,UA Negative Negative   Protein,UA Negative Negative/Trace   Glucose, UA Negative Negative   Ketones, UA Negative Negative   RBC, UA Negative Negative   Bilirubin, UA Negative Negative   Urobilinogen, Ur 0.2 0.2 - 1.0 mg/dL   Nitrite, UA Negative Negative   Microscopic Examination Comment    Narrative   Performed at:  [address] [facility] [address] [place], [place]  [id number] Lab Director: Chinita Pester MT, Phone:  [phone]       BMET No results for input(s): "NA", "K", "CL", "CO2", "GLUCOSE", "BUN", "CREATININE", "CALCIUM" in the last 72 hours.  PT/INR No results for input(s): "LABPROT", "INR" in the last 72 hours. ABG No results for input(s): "PHART", "HCO3" in the last 72 hours.  Invalid input(s): "PCO2", "PO2" UA has only trace heme.    Lab Results  Component Value Date   PSA1 2.4 08/05/2022   PSA1 15.3 (H) 09/04/2021     Studies/Results: No results found. No results found.    Assessment/Plan:  Prostate cancer.   He has T1c Nx Mx GG2 intermediate risk prostate cancer. He is doing well s/p seed implant and will return in 6 months with a PSA.  BPH with BOO.  He has mild LUTS and is improving.  Renal stones.   I will get a KUB at f/u.  ED.   I discussed options for treatment but he would like to think about it before  trying anything.    No orders of the defined types were placed in this encounter.    Orders Placed This Encounter  Procedures   DG Abd 1 View    Standing Status:   Future    Standing Expiration Date:   02/13/2023    Order Specific Question:   Reason  for Exam (SYMPTOM  OR DIAGNOSIS REQUIRED)    Answer:   renal stones    Order Specific Question:   Preferred imaging location?    Answer:   [name]    Order Specific Question:   Radiology Contrast Protocol - do NOT remove file path    Answer:   [email or web address]   Urinalysis, Routine w reflex microscopic   PSA    Standing Status:   Future    Standing Expiration Date:   08/13/2023     Return in about 6 months (around 02/13/2023) for with KUB and PSA.    CC: Dr. Benita Stabile.      Bjorn Pippin 08/14/2022

## 2022-08-23 DIAGNOSIS — E1165 Type 2 diabetes mellitus with hyperglycemia: Secondary | ICD-10-CM | POA: Diagnosis not present

## 2022-09-01 DIAGNOSIS — D631 Anemia in chronic kidney disease: Secondary | ICD-10-CM | POA: Diagnosis not present

## 2022-09-01 DIAGNOSIS — N189 Chronic kidney disease, unspecified: Secondary | ICD-10-CM | POA: Diagnosis not present

## 2022-09-01 DIAGNOSIS — R809 Proteinuria, unspecified: Secondary | ICD-10-CM | POA: Diagnosis not present

## 2022-09-09 DIAGNOSIS — E113591 Type 2 diabetes mellitus with proliferative diabetic retinopathy without macular edema, right eye: Secondary | ICD-10-CM | POA: Diagnosis not present

## 2022-09-09 DIAGNOSIS — E113512 Type 2 diabetes mellitus with proliferative diabetic retinopathy with macular edema, left eye: Secondary | ICD-10-CM | POA: Diagnosis not present

## 2022-09-10 DIAGNOSIS — N1832 Chronic kidney disease, stage 3b: Secondary | ICD-10-CM | POA: Diagnosis not present

## 2022-09-10 DIAGNOSIS — R809 Proteinuria, unspecified: Secondary | ICD-10-CM | POA: Diagnosis not present

## 2022-09-10 DIAGNOSIS — E1122 Type 2 diabetes mellitus with diabetic chronic kidney disease: Secondary | ICD-10-CM | POA: Diagnosis not present

## 2022-09-10 DIAGNOSIS — E1129 Type 2 diabetes mellitus with other diabetic kidney complication: Secondary | ICD-10-CM | POA: Diagnosis not present

## 2022-09-23 DIAGNOSIS — E1165 Type 2 diabetes mellitus with hyperglycemia: Secondary | ICD-10-CM | POA: Diagnosis not present

## 2022-09-30 DIAGNOSIS — H468 Other optic neuritis: Secondary | ICD-10-CM | POA: Diagnosis not present

## 2022-09-30 DIAGNOSIS — H40003 Preglaucoma, unspecified, bilateral: Secondary | ICD-10-CM | POA: Diagnosis not present

## 2022-09-30 DIAGNOSIS — E113512 Type 2 diabetes mellitus with proliferative diabetic retinopathy with macular edema, left eye: Secondary | ICD-10-CM | POA: Diagnosis not present

## 2022-09-30 DIAGNOSIS — H2513 Age-related nuclear cataract, bilateral: Secondary | ICD-10-CM | POA: Diagnosis not present

## 2022-10-02 ENCOUNTER — Encounter: Payer: Self-pay | Admitting: Podiatry

## 2022-10-02 ENCOUNTER — Ambulatory Visit: Payer: Medicare Other | Admitting: Podiatry

## 2022-10-02 DIAGNOSIS — E1165 Type 2 diabetes mellitus with hyperglycemia: Secondary | ICD-10-CM

## 2022-10-02 DIAGNOSIS — M79675 Pain in left toe(s): Secondary | ICD-10-CM | POA: Diagnosis not present

## 2022-10-02 DIAGNOSIS — B351 Tinea unguium: Secondary | ICD-10-CM

## 2022-10-02 DIAGNOSIS — M79674 Pain in right toe(s): Secondary | ICD-10-CM

## 2022-10-02 NOTE — Progress Notes (Signed)
This patient returns to my office for at risk foot care.  This patient requires this care by a professional since this patient will be at risk due to having  diabetes.  This patient is unable to cut nails himself since the patient cannot reach his nails.These nails are painful walking and wearing shoes.  This patient presents for at risk foot care today.  General Appearance  Alert, conversant and in no acute stress.  Vascular  Dorsalis pedis and posterior tibial  pulses are palpable  bilaterally.  Capillary return is within normal limits  bilaterally. Temperature is within normal limits  bilaterally.  Neurologic  Senn-Weinstein monofilament wire test within normal limits  bilaterally. Muscle power within normal limits bilaterally.  Nails Thick disfigured discolored nails with subungual debris  from hallux to fifth toes bilaterally. No evidence of bacterial infection or drainage bilaterally.  Orthopedic  No limitations of motion  feet .  No crepitus or effusions noted.  No bony pathology or digital deformities noted.  Skin  normotropic skin with no porokeratosis noted bilaterally.  No signs of infections or ulcers noted.     Onychomycosis  Pain in right toes  Pain in left toes  Consent was obtained for treatment procedures.   Mechanical debridement of nails 1-5  bilaterally performed with a nail nipper.  Filed with dremel without incident.    Return office visit   3 months                   Told patient to return for periodic foot care and evaluation due to potential at risk complications.   Gregory Mayer DPM   

## 2022-10-06 DIAGNOSIS — E113591 Type 2 diabetes mellitus with proliferative diabetic retinopathy without macular edema, right eye: Secondary | ICD-10-CM | POA: Diagnosis not present

## 2022-10-15 DIAGNOSIS — E1165 Type 2 diabetes mellitus with hyperglycemia: Secondary | ICD-10-CM | POA: Diagnosis not present

## 2022-10-15 DIAGNOSIS — E079 Disorder of thyroid, unspecified: Secondary | ICD-10-CM | POA: Diagnosis not present

## 2022-10-15 DIAGNOSIS — E785 Hyperlipidemia, unspecified: Secondary | ICD-10-CM | POA: Diagnosis not present

## 2022-10-22 DIAGNOSIS — I700 Atherosclerosis of aorta: Secondary | ICD-10-CM | POA: Diagnosis not present

## 2022-10-22 DIAGNOSIS — K769 Liver disease, unspecified: Secondary | ICD-10-CM | POA: Diagnosis not present

## 2022-10-22 DIAGNOSIS — N1832 Chronic kidney disease, stage 3b: Secondary | ICD-10-CM | POA: Diagnosis not present

## 2022-10-22 DIAGNOSIS — E785 Hyperlipidemia, unspecified: Secondary | ICD-10-CM | POA: Diagnosis not present

## 2022-10-22 DIAGNOSIS — R7401 Elevation of levels of liver transaminase levels: Secondary | ICD-10-CM | POA: Diagnosis not present

## 2022-10-22 DIAGNOSIS — I129 Hypertensive chronic kidney disease with stage 1 through stage 4 chronic kidney disease, or unspecified chronic kidney disease: Secondary | ICD-10-CM | POA: Diagnosis not present

## 2022-10-22 DIAGNOSIS — E079 Disorder of thyroid, unspecified: Secondary | ICD-10-CM | POA: Diagnosis not present

## 2022-10-22 DIAGNOSIS — E1122 Type 2 diabetes mellitus with diabetic chronic kidney disease: Secondary | ICD-10-CM | POA: Diagnosis not present

## 2022-10-22 DIAGNOSIS — E1165 Type 2 diabetes mellitus with hyperglycemia: Secondary | ICD-10-CM | POA: Diagnosis not present

## 2022-10-22 DIAGNOSIS — Z23 Encounter for immunization: Secondary | ICD-10-CM | POA: Diagnosis not present

## 2022-11-11 DIAGNOSIS — E113512 Type 2 diabetes mellitus with proliferative diabetic retinopathy with macular edema, left eye: Secondary | ICD-10-CM | POA: Diagnosis not present

## 2022-11-12 DIAGNOSIS — Z794 Long term (current) use of insulin: Secondary | ICD-10-CM | POA: Diagnosis not present

## 2022-11-12 DIAGNOSIS — Z713 Dietary counseling and surveillance: Secondary | ICD-10-CM | POA: Diagnosis not present

## 2022-11-12 DIAGNOSIS — Z6821 Body mass index (BMI) 21.0-21.9, adult: Secondary | ICD-10-CM | POA: Diagnosis not present

## 2022-11-12 DIAGNOSIS — E1165 Type 2 diabetes mellitus with hyperglycemia: Secondary | ICD-10-CM | POA: Diagnosis not present

## 2022-11-12 DIAGNOSIS — Z7985 Long-term (current) use of injectable non-insulin antidiabetic drugs: Secondary | ICD-10-CM | POA: Diagnosis not present

## 2022-12-16 DIAGNOSIS — Z23 Encounter for immunization: Secondary | ICD-10-CM | POA: Diagnosis not present

## 2022-12-22 DIAGNOSIS — N1832 Chronic kidney disease, stage 3b: Secondary | ICD-10-CM | POA: Diagnosis not present

## 2022-12-22 DIAGNOSIS — D638 Anemia in other chronic diseases classified elsewhere: Secondary | ICD-10-CM | POA: Diagnosis not present

## 2022-12-22 DIAGNOSIS — R809 Proteinuria, unspecified: Secondary | ICD-10-CM | POA: Diagnosis not present

## 2022-12-25 ENCOUNTER — Encounter: Payer: Self-pay | Admitting: Podiatry

## 2022-12-25 ENCOUNTER — Ambulatory Visit: Payer: Medicare Other | Admitting: Podiatry

## 2022-12-25 DIAGNOSIS — M79675 Pain in left toe(s): Secondary | ICD-10-CM

## 2022-12-25 DIAGNOSIS — B351 Tinea unguium: Secondary | ICD-10-CM

## 2022-12-25 DIAGNOSIS — M79674 Pain in right toe(s): Secondary | ICD-10-CM | POA: Diagnosis not present

## 2022-12-25 DIAGNOSIS — E1165 Type 2 diabetes mellitus with hyperglycemia: Secondary | ICD-10-CM

## 2022-12-25 NOTE — Progress Notes (Signed)
This patient returns to my office for at risk foot care.  This patient requires this care by a professional since this patient will be at risk due to having  diabetes.  This patient is unable to cut nails himself since the patient cannot reach his nails.These nails are painful walking and wearing shoes.  This patient presents for at risk foot care today.  General Appearance  Alert, conversant and in no acute stress.  Vascular  Dorsalis pedis and posterior tibial  pulses are palpable  bilaterally.  Capillary return is within normal limits  bilaterally. Temperature is within normal limits  bilaterally.  Neurologic  Senn-Weinstein monofilament wire test within normal limits  bilaterally. Muscle power within normal limits bilaterally.  Nails Thick disfigured discolored nails with subungual debris  from hallux to fifth toes bilaterally. No evidence of bacterial infection or drainage bilaterally.  Orthopedic  No limitations of motion  feet .  No crepitus or effusions noted.  No bony pathology or digital deformities noted.  Skin  normotropic skin with no porokeratosis noted bilaterally.  No signs of infections or ulcers noted.     Onychomycosis  Pain in right toes  Pain in left toes  Consent was obtained for treatment procedures.   Mechanical debridement of nails 1-5  bilaterally performed with a nail nipper.  Filed with dremel without incident.    Return office visit   3 months                   Told patient to return for periodic foot care and evaluation due to potential at risk complications.   Helane Gunther DPM

## 2022-12-30 DIAGNOSIS — R809 Proteinuria, unspecified: Secondary | ICD-10-CM | POA: Diagnosis not present

## 2022-12-30 DIAGNOSIS — E1122 Type 2 diabetes mellitus with diabetic chronic kidney disease: Secondary | ICD-10-CM | POA: Diagnosis not present

## 2022-12-30 DIAGNOSIS — E1129 Type 2 diabetes mellitus with other diabetic kidney complication: Secondary | ICD-10-CM | POA: Diagnosis not present

## 2022-12-30 DIAGNOSIS — N1832 Chronic kidney disease, stage 3b: Secondary | ICD-10-CM | POA: Diagnosis not present

## 2023-01-13 DIAGNOSIS — H468 Other optic neuritis: Secondary | ICD-10-CM | POA: Diagnosis not present

## 2023-01-13 DIAGNOSIS — H2513 Age-related nuclear cataract, bilateral: Secondary | ICD-10-CM | POA: Diagnosis not present

## 2023-01-13 DIAGNOSIS — H40003 Preglaucoma, unspecified, bilateral: Secondary | ICD-10-CM | POA: Diagnosis not present

## 2023-01-13 DIAGNOSIS — E113512 Type 2 diabetes mellitus with proliferative diabetic retinopathy with macular edema, left eye: Secondary | ICD-10-CM | POA: Diagnosis not present

## 2023-01-20 DIAGNOSIS — E113512 Type 2 diabetes mellitus with proliferative diabetic retinopathy with macular edema, left eye: Secondary | ICD-10-CM | POA: Diagnosis not present

## 2023-02-12 ENCOUNTER — Other Ambulatory Visit: Payer: Medicare Other

## 2023-02-12 DIAGNOSIS — C61 Malignant neoplasm of prostate: Secondary | ICD-10-CM

## 2023-02-13 LAB — PSA: Prostate Specific Ag, Serum: 1.5 ng/mL (ref 0.0–4.0)

## 2023-02-16 ENCOUNTER — Other Ambulatory Visit: Payer: Self-pay

## 2023-02-16 ENCOUNTER — Ambulatory Visit (HOSPITAL_COMMUNITY)
Admission: RE | Admit: 2023-02-16 | Discharge: 2023-02-16 | Disposition: A | Discharge status: Home / Self Care | Payer: Medicare Other | Source: Ambulatory Visit | Attending: Urology | Admitting: Urology

## 2023-02-16 DIAGNOSIS — Z87442 Personal history of urinary calculi: Secondary | ICD-10-CM | POA: Diagnosis not present

## 2023-02-16 DIAGNOSIS — N200 Calculus of kidney: Secondary | ICD-10-CM | POA: Diagnosis not present

## 2023-02-16 DIAGNOSIS — N201 Calculus of ureter: Secondary | ICD-10-CM

## 2023-02-18 ENCOUNTER — Ambulatory Visit: Payer: Medicare Other | Admitting: Urology

## 2023-02-18 ENCOUNTER — Encounter: Payer: Self-pay | Admitting: Urology

## 2023-02-18 VITALS — BP 167/93 | HR 96

## 2023-02-18 DIAGNOSIS — Z8546 Personal history of malignant neoplasm of prostate: Secondary | ICD-10-CM

## 2023-02-18 DIAGNOSIS — N1832 Chronic kidney disease, stage 3b: Secondary | ICD-10-CM | POA: Diagnosis not present

## 2023-02-18 DIAGNOSIS — E1165 Type 2 diabetes mellitus with hyperglycemia: Secondary | ICD-10-CM | POA: Diagnosis not present

## 2023-02-18 DIAGNOSIS — E079 Disorder of thyroid, unspecified: Secondary | ICD-10-CM | POA: Diagnosis not present

## 2023-02-18 DIAGNOSIS — R3129 Other microscopic hematuria: Secondary | ICD-10-CM

## 2023-02-18 DIAGNOSIS — N200 Calculus of kidney: Secondary | ICD-10-CM | POA: Diagnosis not present

## 2023-02-18 DIAGNOSIS — N138 Other obstructive and reflux uropathy: Secondary | ICD-10-CM

## 2023-02-18 DIAGNOSIS — N401 Enlarged prostate with lower urinary tract symptoms: Secondary | ICD-10-CM

## 2023-02-18 DIAGNOSIS — E785 Hyperlipidemia, unspecified: Secondary | ICD-10-CM | POA: Diagnosis not present

## 2023-02-18 DIAGNOSIS — D649 Anemia, unspecified: Secondary | ICD-10-CM | POA: Diagnosis not present

## 2023-02-18 LAB — URINALYSIS, ROUTINE W REFLEX MICROSCOPIC
Bilirubin, UA: NEGATIVE
Glucose, UA: NEGATIVE
Ketones, UA: NEGATIVE
Leukocytes,UA: NEGATIVE
Nitrite, UA: NEGATIVE
Protein,UA: NEGATIVE
Specific Gravity, UA: 1.02 (ref 1.005–1.030)
Urobilinogen, Ur: 0.2 mg/dL (ref 0.2–1.0)
pH, UA: 6 (ref 5.0–7.5)

## 2023-02-18 LAB — MICROSCOPIC EXAMINATION
Bacteria, UA: NONE SEEN
RBC, Urine: >30 /[HPF] — AB (ref 0–2)

## 2023-02-18 NOTE — Progress Notes (Signed)
Subjective: 1. History of prostate cancer   2. Renal stones   3. Microhematuria   4. Stage 3b chronic kidney disease (HCC)      02/18/23: Alec Bruce returns today in f/u for his history below.  His PSA is down to 1.5 from 2.4 in 7/24.  He has had no flank pain or gross hematuria but he has >30 RBC's on the UA today.   A KUB on 02/16/23 shows no stones.   He is voiding well with an IPSS of 2 and nocturia x 1.  His weight is stable.  He has no bone pain.  He has some low back pain.  He has had variable BM's with some loose stools.   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  His PSA is down to 2.4 from 15.3 preop.   He has a history of stones as well and had left ureteroscopy last year.  There were small bilateral renal stones on the CT from 9/23 in addition to the treated stone.   He is voiding well with an IPSS of 6 and nocturia x 1.   He has had no flank pain or hematuria.  He has stable ED that has never been treated.  He has no tumescence.    05/07/22: Alec Bruce returns today in f/u from a seed implant on 04/10/22.  He is doing well with mild/mod LUTS with some urgency and a reduced stream.  He can have some UUI but it is improving.  He has no hematuria. He has no GI complaints.  His UA is clear.     01/15/22: Alec Bruce returns today in f/u.  He had a KUB on 12/04/21 following left ureteroscopy on 10/22/21 and only had a small right renal stone. A renal US on 11/27/21 showed on hydro and only a 2.5cm left renal cyst.  He was seen in the [facility] for his recent prostate cancer and he is interested in pursuing a seed implant.    He has no flank pain or hematuria and his IPSS is 1. UA today has 3-10 RBC's.   10/30/21: Alec Bruce returns today in f/u from his recent left ureteroscopy and prostate biopsy.   He has some hypotension since he got started on the tamsulosin.  He has had no fever or flank pain.  His  biopsy showed a 38ml prostate with GG1 in 10% of the RAL core and GG2 in 5% of the LAL core.  His prebiopsy PSA was 15.  The [facility] nomogram predicts 53% OCD, 44% ECE, 5% LNI and 4% SVI.  He doesn't need staging studies.  He has mild LUTS but severe ED.   10/02/21: Alec Bruce returns today in f/u for his history of an elevated PSA and microhematuria with history of stones.  He had a PSA on 8/3 that was up further to 15.3 and had a CT stone study on 09/18/21 that showed a 9mm left UVJ stone without obstruction. The stone was in the [facility] in 2022.  He has no flank pain or gross hematuria.  He has no urgency.    He has some increased frequency but has increased his fluid intake.   09/04/21: Alec Bruce is a 68 yo male who is sent for a recent PSA elevation of 10. He has not had a rectal exam.  He has pass a couple of kidney stones but has no other GU history. He has a  reduced stream with some intermittency and post void dribbling.  He has had no hematuria or dysuria. He has had no other recent acute ailments.  He has ED.   He has DM with neuropathy and CKD3b as well as a history of colon cancer with a partial colectomy in the 1990s.  He has 11-30 RBC's on UA today.  He had a CT stone study in 6/22 that showed non-obstructing renal stones.      IPSS     Row Name 02/18/23 0900         International Prostate Symptom Score   How often have you had the sensation of not emptying your bladder? Not at All     How often have you had to urinate less than every two hours? Not at All     How often have you found you stopped and started again several times when you urinated? Less than 1 in 5 times     How often have you found it difficult to postpone urination? Not at All     How often have you had a weak urinary stream? Not at All     How often have you had to strain to start urination? Not at All     How many times did you typically get up at night to urinate? 1 Time     Total IPSS Score 2       Quality of Life due  to urinary symptoms   If you were to spend the rest of your life with your urinary condition just the way it is now how would you feel about that? Delighted                 ROS:  Review of Systems  All other systems reviewed and are negative.   No Known Allergies  Past Medical History:  Diagnosis Date   Anemia, chronic disease    BPH with urinary obstruction    Chronic metabolic acidosis    CKD (chronic kidney disease), stage III [facility])    nephrologist--- dr Wolfgang Phoenix;   History of cancer chemotherapy    colon cancer 09/ 1990 completed 1 yr 1991   History of colon cancer in adulthood 09/1988   s/p  subtotal  colectomy w/ node dissection's w/ 3 positive nodes,  completed 1 yr chemo in 1991,  as of 02/ 2023 no recurrence since   History of hyperthyroidism 2004   s/p RAI 01-22-2003 multinodular goitor   History of kidney stones    History of TIA (transient ischemic attack) 03/20/2019   no residual   Hyperlipidemia    Hypertension    Hypocitraturia    Malignant neoplasm prostate (HCC) 10/2021   urologist--- dr Johnpatrick Jenny/  radiation oncologist--- dr Kathrynn Running;  dx 09/ 2023,  Gleason 3+4, PSA 15.4   Multinodular goiter    Nephrolithiasis    Peripheral neuropathy    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)    Secondary hyperparathyroidism of renal origin (HCC)    Type 2 diabetes mellitus treated with insulin (HCC)    followed by pcp;   (04-07-2022  per pt check blood sugar daily am fasting ,  average 102)   Wears glasses     Past Surgical History:  Procedure Laterality Date   APPENDECTOMY  1966   CATARACT EXTRACTION W/ INTRAOCULAR LENS IMPLANT Left 2022   COLONOSCOPY WITH PROPOFOL N/A 03/31/2021   Procedure: COLONOSCOPY WITH PROPOFOL;  Surgeon: Lanelle Bal, DO;  Location: [facility]  END[facility];  Service: Endoscopy;  Laterality: N/A;  10:00 / ASA 3   CYSTOSCOPY WITH RETROGRADE PYELOGRAM, URETEROSCOPY AND STENT PLACEMENT Left 10/22/2021   Procedure: CYSTOSCOPY WITH  RETROGRADE PYELOGRAM, URETEROSCOPY AND STENT PLACEMENT;  Surgeon: Milderd Meager., MD;  Location: [facility] ORS;  Service: Urology;  Laterality: Left;   HOLMIUM LASER APPLICATION Left 10/22/2021   Procedure: HOLMIUM LASER APPLICATION;  Surgeon: Milderd Meager., MD;  Location: [facility] ORS;  Service: Urology;  Laterality: Left;   POLYPECTOMY  03/31/2021   Procedure: POLYPECTOMY;  Surgeon: Lanelle Bal, DO;  Location: [facility];  Service: Endoscopy;;   PROSTATE BIOPSY N/A 10/22/2021   Procedure: PROSTATE BIOPSY;  Surgeon: Milderd Meager., MD;  Location: [facility];  Service: Urology;  Laterality: N/A;   RADIOACTIVE SEED IMPLANT N/A 04/10/2022   Procedure: RADIOACTIVE SEED IMPLANT/BRACHYTHERAPY IMPLANT;  Surgeon: Bjorn Pippin, MD;  Location: [facility] ORS;  Service: Urology;  Laterality: N/A;   SPACE OAR INSTILLATION N/A 04/10/2022   Procedure: SPACE OAR INSTILLATION;  Surgeon: Bjorn Pippin, MD;  Location: [facility] ORS;  Service: Urology;  Laterality: N/A;   SUBTOTAL COLECTOMY  09/1988   @[facility]  by dr s. Merry Lofty;    w/ lymph node dissection's    Social History   Socioeconomic History   Marital status: Single    Spouse name: Not on file   Number of children: Not on file   Years of education: Not on file   Highest education level: Not on file  Occupational History   Not on file  Tobacco Use   Smoking status: Former    Current packs/day: 0.00    Types: Cigarettes    Start date: 58    Quit date: 93    Years since quitting: 39.0   Smokeless tobacco: Never  Vaping Use   Vaping status: Never Used  Substance and Sexual Activity   Alcohol use: Not Currently   Drug use: Not Currently    Types: Marijuana    Comment: 04-07-2022 per pt last used 1990s   Sexual activity: Not on file  Other Topics Concern   Not on file  Social History Narrative   Not on file   Social Drivers of Health   Financial Resource Strain: Not on file  Food Insecurity: No Food Insecurity (04/30/2022)   Hunger Vital Sign     Worried About Running Out of Food in the Last Year: Never true    Ran Out of Food in the Last Year: Never true  Transportation Needs: No Transportation Needs (04/30/2022)   PRAPARE - Administrator, Civil Service (Medical): No    Lack of Transportation (Non-Medical): No  Physical Activity: Not on file  Stress: Not on file  Social Connections: Not on file  Intimate Partner Violence: Not At Risk (04/30/2022)   Humiliation, Afraid, Rape, and Kick questionnaire    Fear of Current or Ex-Partner: No    Emotionally Abused: No    Physically Abused: No    Sexually Abused: No    Family History  Problem Relation Age of Onset   Colon cancer Mother 25 - 85   Colon cancer Cousin     Anti-infectives: Anti-infectives (From admission, onward)    None       Current Outpatient Medications  Medication Sig Dispense Refill   aspirin EC 81 MG tablet Take 81 mg by mouth in the morning. Swallow whole.     atorvastatin (LIPITOR) 40 MG tablet Take 1 tablet (40 mg total) by mouth  every evening. For stroke prevention (Patient taking differently: Take 40 mg by mouth every evening. For stroke prevention) 30 tablet 11   Cholecalciferol ([employer] VIT D 5000 HIGH-POTENCY PO) Take 5,000 Units by mouth in the morning.     DULoxetine (CYMBALTA) 60 MG capsule Take 60 mg by mouth at bedtime.     ferrous sulfate 325 (65 FE) MG tablet Take 325 mg by mouth in the morning and at bedtime.     glucose 4 GM chewable tablet Chew 1 tablet by mouth once as needed for low blood sugar.     insulin glargine (LANTUS) 100 UNIT/ML injection Inject 10 Units into the skin at bedtime.     latanoprost (XALATAN) 0.005 % ophthalmic solution Place 1 drop into both eyes at bedtime.     sodium citrate-citric acid (ORACIT) 500-334 MG/5ML solution Take 15 mLs by mouth 2 (two) times daily. Pt takes morning and bedtime.     metoprolol succinate (TOPROL XL) 25 MG 24 hr tablet Take 1 tablet (25 mg total) by mouth daily. (Patient taking  differently: Take 25 mg by mouth in the morning.) 30 tablet 11   No current facility-administered medications for this visit.     Objective: Vital signs in last 24 hours: BP (!) 167/93   Pulse 96   Intake/Output from previous day: No intake/output data recorded. Intake/Output this shift: @IOTHISSHIFT @   Physical Exam Vitals reviewed.  Constitutional:      Appearance: Normal appearance.  Neurological:     Mental Status: He is alert.     Lab Results:  Results for orders placed or performed in visit on 02/18/23 (from the past 24 hours)  Urinalysis, Routine w reflex microscopic     Status: Abnormal   Collection Time: 02/18/23  8:59 AM  Result Value Ref Range   Specific Gravity, UA 1.020 1.005 - 1.030   pH, UA 6.0 5.0 - 7.5   Color, UA Yellow Yellow   Appearance Ur Clear Clear   Leukocytes,UA Negative Negative   Protein,UA Negative Negative/Trace   Glucose, UA Negative Negative   Ketones, UA Negative Negative   RBC, UA 3+ (A) Negative   Bilirubin, UA Negative Negative   Urobilinogen, Ur 0.2 0.2 - 1.0 mg/dL   Nitrite, UA Negative Negative   Microscopic Examination See below:    Narrative   Performed at:  [address] - [facility] [address], [place], [place]  [id number] Lab Director: Chinita Pester MT, Phone:  [phone]  Microscopic Examination     Status: Abnormal   Collection Time: 02/18/23  8:59 AM   Urine  Result Value Ref Range   WBC, UA 0-5 0 - 5 /hpf   RBC, Urine >30 (A) 0 - 2 /hpf   Epithelial Cells (non renal) 0-10 0 - 10 /hpf   Bacteria, UA None seen None seen/Few   Narrative   Performed at:  [address] - [facility] [address], [place], [place]  [id number] Lab Director: Chinita Pester MT, Phone:  [phone]        BMET No results for input(s): "NA", "K", "CL", "CO2", "GLUCOSE", "BUN", "CREATININE", "CALCIUM" in the last 72 hours.  PT/INR No results for input(s): "LABPROT", "INR" in the last 72 hours. ABG No results for input(s):  "PHART", "HCO3" in the last 72 hours.  Invalid input(s): "PCO2", "PO2" UA has only trace heme.    Lab Results  Component Value Date   PSA1 1.5 02/12/2023   PSA1 2.4 08/05/2022   PSA1 15.3 (H)  09/04/2021     Studies/Results: No results found. No results found.    Assessment/Plan:  History of Prostate cancer.   He has T1c Nx Mx GG2 intermediate risk prostate cancer. He is doing well s/p seed implant and will return in 6 months with a PSA.  BPH with BOO.  He has minimal  LUTS and is improving.  Microhematuria with Renal stones.   KUB is negative.  I will get a CT stone study and have him return for possible cystoscopy.   ED.   He has not been interested in therapy.   No orders of the defined types were placed in this encounter.    Orders Placed This Encounter  Procedures   Microscopic Examination   CT RENAL STONE STUDY    Standing Status:   Future    Expected Date:   02/25/2023    Expiration Date:   05/19/2023    Preferred imaging location?:   [facility]    Radiology Contrast Protocol - do NOT remove file path:   [email or web address]   Urinalysis, Routine w reflex microscopic   PSA    Standing Status:   Future    Expected Date:   08/18/2023    Expiration Date:   02/18/2024     Return for Next available with CT results for possible cystoscopy.   Then 6 months with PSA. Marland Kitchen    CC: Dr. Benita Stabile.      Bjorn Pippin 02/19/2023

## 2023-02-25 DIAGNOSIS — R945 Abnormal results of liver function studies: Secondary | ICD-10-CM | POA: Diagnosis not present

## 2023-02-25 DIAGNOSIS — E785 Hyperlipidemia, unspecified: Secondary | ICD-10-CM | POA: Diagnosis not present

## 2023-02-25 DIAGNOSIS — E1165 Type 2 diabetes mellitus with hyperglycemia: Secondary | ICD-10-CM | POA: Diagnosis not present

## 2023-02-25 DIAGNOSIS — I10 Essential (primary) hypertension: Secondary | ICD-10-CM | POA: Diagnosis not present

## 2023-02-25 DIAGNOSIS — K769 Liver disease, unspecified: Secondary | ICD-10-CM | POA: Diagnosis not present

## 2023-02-25 DIAGNOSIS — I700 Atherosclerosis of aorta: Secondary | ICD-10-CM | POA: Diagnosis not present

## 2023-02-25 DIAGNOSIS — H409 Unspecified glaucoma: Secondary | ICD-10-CM | POA: Diagnosis not present

## 2023-02-25 DIAGNOSIS — D649 Anemia, unspecified: Secondary | ICD-10-CM | POA: Diagnosis not present

## 2023-02-25 DIAGNOSIS — N1832 Chronic kidney disease, stage 3b: Secondary | ICD-10-CM | POA: Diagnosis not present

## 2023-02-25 DIAGNOSIS — E079 Disorder of thyroid, unspecified: Secondary | ICD-10-CM | POA: Diagnosis not present

## 2023-03-29 ENCOUNTER — Encounter: Payer: Self-pay | Admitting: Podiatry

## 2023-03-29 ENCOUNTER — Ambulatory Visit: Payer: Medicare Other | Admitting: Podiatry

## 2023-03-29 DIAGNOSIS — M79675 Pain in left toe(s): Secondary | ICD-10-CM

## 2023-03-29 DIAGNOSIS — E1165 Type 2 diabetes mellitus with hyperglycemia: Secondary | ICD-10-CM

## 2023-03-29 DIAGNOSIS — M79674 Pain in right toe(s): Secondary | ICD-10-CM | POA: Diagnosis not present

## 2023-03-29 DIAGNOSIS — B351 Tinea unguium: Secondary | ICD-10-CM

## 2023-03-29 NOTE — Progress Notes (Signed)
 This patient returns to my office for at risk foot care.  This patient requires this care by a professional since this patient will be at risk due to having  diabetes.  This patient is unable to cut nails himself since the patient cannot reach his nails.These nails are painful walking and wearing shoes.  This patient presents for at risk foot care today.  General Appearance  Alert, conversant and in no acute stress.  Vascular  Dorsalis pedis and posterior tibial  pulses are palpable  bilaterally.  Capillary return is within normal limits  bilaterally. Temperature is within normal limits  bilaterally.  Neurologic  Senn-Weinstein monofilament wire test within normal limits  bilaterally. Muscle power within normal limits bilaterally.  Nails Thick disfigured discolored nails with subungual debris  from hallux to fifth toes bilaterally. No evidence of bacterial infection or drainage bilaterally.  Orthopedic  No limitations of motion  feet .  No crepitus or effusions noted.  No bony pathology or digital deformities noted.  Skin  normotropic skin with no porokeratosis noted bilaterally.  No signs of infections or ulcers noted.     Onychomycosis  Pain in right toes  Pain in left toes  Consent was obtained for treatment procedures.   Mechanical debridement of nails 1-5  bilaterally performed with a nail nipper.  Filed with dremel without incident.    Return office visit    4  months                  Told patient to return for periodic foot care and evaluation due to potential at risk complications.   Helane Gunther DPM

## 2023-03-31 DIAGNOSIS — E113512 Type 2 diabetes mellitus with proliferative diabetic retinopathy with macular edema, left eye: Secondary | ICD-10-CM | POA: Diagnosis not present

## 2023-04-07 ENCOUNTER — Ambulatory Visit (HOSPITAL_COMMUNITY)
Admission: RE | Admit: 2023-04-07 | Discharge: 2023-04-07 | Disposition: A | Discharge status: Home / Self Care | Payer: Medicare Other | Source: Ambulatory Visit | Attending: Urology | Admitting: Urology

## 2023-04-07 DIAGNOSIS — R3129 Other microscopic hematuria: Secondary | ICD-10-CM | POA: Insufficient documentation

## 2023-04-07 DIAGNOSIS — N200 Calculus of kidney: Secondary | ICD-10-CM | POA: Diagnosis not present

## 2023-04-07 DIAGNOSIS — E1165 Type 2 diabetes mellitus with hyperglycemia: Secondary | ICD-10-CM | POA: Diagnosis not present

## 2023-04-07 DIAGNOSIS — N281 Cyst of kidney, acquired: Secondary | ICD-10-CM | POA: Diagnosis not present

## 2023-04-07 DIAGNOSIS — K8020 Calculus of gallbladder without cholecystitis without obstruction: Secondary | ICD-10-CM | POA: Diagnosis not present

## 2023-04-08 ENCOUNTER — Ambulatory Visit: Payer: Medicare Other | Admitting: Urology

## 2023-04-08 ENCOUNTER — Encounter: Payer: Self-pay | Admitting: Urology

## 2023-04-08 VITALS — BP 129/67 | HR 94

## 2023-04-08 DIAGNOSIS — N200 Calculus of kidney: Secondary | ICD-10-CM | POA: Diagnosis not present

## 2023-04-08 DIAGNOSIS — R3129 Other microscopic hematuria: Secondary | ICD-10-CM

## 2023-04-08 DIAGNOSIS — Z87898 Personal history of other specified conditions: Secondary | ICD-10-CM

## 2023-04-08 DIAGNOSIS — Z8546 Personal history of malignant neoplasm of prostate: Secondary | ICD-10-CM | POA: Diagnosis not present

## 2023-04-08 MED ORDER — CIPROFLOXACIN HCL 500 MG PO TABS
500.0000 mg | ORAL_TABLET | Freq: Once | ORAL | Status: AC
  Administered 2023-04-08: 500 mg via ORAL

## 2023-04-08 NOTE — Progress Notes (Signed)
 Subjective: 1. History of prostate cancer   2. Microhematuria   3. Renal stones     04/08/23: Alec Bruce returns today in f/u for cystoscopy for his hsitory of hematuria.  A CT stone study yesterday showed a small bilateral renal stones and a left renal cysts.  He has had no gross hematuria and his IPSS is 1 with nocturia.   02/18/23: Alec Bruce returns today in f/u for his history below.  His PSA is down to 1.5 from 2.4 in 7/24.  He has had no flank pain or gross hematuria but he has >30 RBC's on the UA today.   A KUB on 02/16/23 shows no stones.   He is voiding well with an IPSS of 2 and nocturia x 1.  His weight is stable.  He has no bone pain.  He has some low back pain.  He has had variable BM's with some loose stools.   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  His PSA is down to 2.4 from 15.3 preop.   He has a history of stones as well and had left ureteroscopy last year.  There were small bilateral renal stones on the CT from 9/23 in addition to the treated stone.   He is voiding well with an IPSS of 6 and nocturia x 1.   He has had no flank pain or hematuria.  He has stable ED that has never been treated.  He has no tumescence.    05/07/22: Alec Bruce returns today in f/u from a seed implant on 04/10/22.  He is doing well with mild/mod LUTS with some urgency and a reduced stream.  He can have some UUI but it is improving.  He has no hematuria. He has no GI complaints.  His UA is clear.     01/15/22: Alec Bruce returns today in f/u.  He had a KUB on 12/04/21 following left ureteroscopy on 10/22/21 and only had a small right renal stone. A renal US on 11/27/21 showed on hydro and only a 2.5cm left renal cyst.  He was seen in the [facility] for his recent prostate cancer and he is interested in pursuing a seed implant.    He has no flank pain or hematuria and his IPSS is 1. UA today has 3-10 RBC's.   10/30/21:  Alec Bruce returns today in f/u from his recent left ureteroscopy and prostate biopsy.   He has some hypotension since he got started on the tamsulosin.  He has had no fever or flank pain.  His biopsy showed a 38ml prostate with GG1 in 10% of the RAL core and GG2 in 5% of the LAL core.  His prebiopsy PSA was 15.  The [facility] nomogram predicts 53% OCD, 44% ECE, 5% LNI and 4% SVI.  He doesn't need staging studies.  He has mild LUTS but severe ED.   10/02/21: Alec Bruce returns today in f/u for his history of an elevated PSA and microhematuria with history of stones.  He had a PSA on 8/3 that was up further to 15.3 and had a CT stone study on 09/18/21 that showed a 9mm left UVJ stone without obstruction. The stone was in the [facility] in 2022.  He has no flank pain or gross hematuria.  He has no urgency.    He has some increased frequency but has increased his fluid intake.   09/04/21: Alec Bruce is a 68 yo  male who is sent for a recent PSA elevation of 10. He has not had a rectal exam.  He has pass a couple of kidney stones but has no other GU history. He has a reduced stream with some intermittency and post void dribbling.  He has had no hematuria or dysuria. He has had no other recent acute ailments.  He has ED.   He has DM with neuropathy and CKD3b as well as a history of colon cancer with a partial colectomy in the 1990s.  He has 11-30 RBC's on UA today.  He had a CT stone study in 6/22 that showed non-obstructing renal stones.      IPSS     Row Name 04/08/23 0900         International Prostate Symptom Score   How often have you had the sensation of not emptying your bladder? Not at All     How often have you had to urinate less than every two hours? Not at All     How often have you found you stopped and started again several times when you urinated? Not at All     How often have you found it difficult to postpone urination? Not at All     How often have you had a weak urinary stream? Not at All     How  often have you had to strain to start urination? Not at All     How many times did you typically get up at night to urinate? 1 Time     Total IPSS Score 1       Quality of Life due to urinary symptoms   If you were to spend the rest of your life with your urinary condition just the way it is now how would you feel about that? Mostly Satisfied                  ROS:  Review of Systems  All other systems reviewed and are negative.   No Known Allergies  Past Medical History:  Diagnosis Date   Anemia, chronic disease    BPH with urinary obstruction    Chronic metabolic acidosis    CKD (chronic kidney disease), stage III [facility])    nephrologist--- dr Wolfgang Phoenix;   History of cancer chemotherapy    colon cancer 09/ 1990 completed 1 yr 1991   History of colon cancer in adulthood 09/1988   s/p  subtotal  colectomy w/ node dissection's w/ 3 positive nodes,  completed 1 yr chemo in 1991,  as of 02/ 2023 no recurrence since   History of hyperthyroidism 2004   s/p RAI 01-22-2003 multinodular goitor   History of kidney stones    History of TIA (transient ischemic attack) 03/20/2019   no residual   Hyperlipidemia    Hypertension    Hypocitraturia    Malignant neoplasm prostate (HCC) 10/2021   urologist--- dr Angelly Spearing/  radiation oncologist--- dr Kathrynn Running;  dx 09/ 2023,  Gleason 3+4, PSA 15.4   Multinodular goiter    Nephrolithiasis    Peripheral neuropathy    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)    Secondary hyperparathyroidism of renal origin (HCC)    Type 2 diabetes mellitus treated with insulin (HCC)    followed by pcp;   (04-07-2022  per pt check blood sugar daily am fasting ,  average 102)   Wears glasses     Past Surgical History:  Procedure Laterality Date  APPENDECTOMY  1966   CATARACT EXTRACTION W/ INTRAOCULAR LENS IMPLANT Left 2022   COLONOSCOPY WITH PROPOFOL N/A 03/31/2021   Procedure: COLONOSCOPY WITH PROPOFOL;  Surgeon: Lanelle Bal, DO;  [facility]:  [facility];  Service: Endoscopy;  Laterality: N/A;  10:00 / ASA 3   CYSTOSCOPY WITH RETROGRADE PYELOGRAM, URETEROSCOPY AND STENT PLACEMENT Left 10/22/2021   Procedure: CYSTOSCOPY WITH RETROGRADE PYELOGRAM, URETEROSCOPY AND STENT PLACEMENT;  Surgeon: Milderd Meager., MD;  Location: [facility];  Service: Urology;  Laterality: Left;   HOLMIUM LASER APPLICATION Left 10/22/2021   Procedure: HOLMIUM LASER APPLICATION;  Surgeon: Milderd Meager., MD;  Location: [facility];  Service: Urology;  Laterality: Left;   POLYPECTOMY  03/31/2021   Procedure: POLYPECTOMY;  Surgeon: Lanelle Bal, DO;  Location: [facility];  Service: Endoscopy;;   PROSTATE BIOPSY N/A 10/22/2021   Procedure: PROSTATE BIOPSY;  Surgeon: Milderd Meager., MD;  Location: [facility];  Service: Urology;  Laterality: N/A;   RADIOACTIVE SEED IMPLANT N/A 04/10/2022   Procedure: RADIOACTIVE SEED IMPLANT/BRACHYTHERAPY IMPLANT;  Surgeon: Bjorn Pippin, MD;  Location: [facility];  Service: Urology;  Laterality: N/A;   SPACE OAR INSTILLATION N/A 04/10/2022   Procedure: SPACE OAR INSTILLATION;  Surgeon: Bjorn Pippin, MD;  Location: [facility];  Service: Urology;  Laterality: N/A;   SUBTOTAL COLECTOMY  09/1988   @[facility]  by dr s. Merry Lofty;    w/ lymph node dissection's    Social History   Socioeconomic History   Marital status: Single    Spouse name: Not on file   Number of children: Not on file   Years of education: Not on file   Highest education level: Not on file  Occupational History   Not on file  Tobacco Use   Smoking status: Former    Current packs/day: 0.00    Types: Cigarettes    Start date: 45    Quit date: 40    Years since quitting: 39.2   Smokeless tobacco: Never  Vaping Use   Vaping status: Never Used  Substance and Sexual Activity   Alcohol use: Not Currently   Drug use: Not Currently    Types: Marijuana    Comment: 04-07-2022 per pt last used 1990s   Sexual activity: Not on file  Other Topics Concern   Not on  file  Social History Narrative   Not on file   Social Drivers of Health   Financial Resource Strain: Not on file  Food Insecurity: No Food Insecurity (04/30/2022)   Hunger Vital Sign    Worried About Running Out of Food in the Last Year: Never true    Ran Out of Food in the Last Year: Never true  Transportation Needs: No Transportation Needs (04/30/2022)   PRAPARE - Administrator, Civil Service (Medical): No    Lack of Transportation (Non-Medical): No  Physical Activity: Not on file  Stress: Not on file  Social Connections: Not on file  Intimate Partner Violence: Not At Risk (04/30/2022)   Humiliation, Afraid, Rape, and Kick questionnaire    Fear of Current or Ex-Partner: No    Emotionally Abused: No    Physically Abused: No    Sexually Abused: No    Family History  Problem Relation Age of Onset   Colon cancer Mother 51 - 25   Colon cancer Cousin     Anti-infectives: Anti-infectives (From admission, onward)    Start     Dose/Rate Route Frequency Ordered Stop   04/08/23 1015  CIPROFLOXACIN HCL 500 MG PO TABS        500 mg Oral  Once 04/08/23 1004 04/08/23 1015       Current Outpatient Medications  Medication Sig Dispense Refill   aspirin EC 81 MG tablet Take 81 mg by mouth in the morning. Swallow whole.     atorvastatin (LIPITOR) 40 MG tablet Take 1 tablet (40 mg total) by mouth every evening. For stroke prevention (Patient taking differently: Take 40 mg by mouth every evening. For stroke prevention) 30 tablet 11   Cholecalciferol ([employer] VIT D 5000 HIGH-POTENCY PO) Take 5,000 Units by mouth in the morning.     DULoxetine (CYMBALTA) 60 MG capsule Take 60 mg by mouth at bedtime.     ferrous sulfate 325 (65 FE) MG tablet Take 325 mg by mouth in the morning and at bedtime.     glucose 4 GM chewable tablet Chew 1 tablet by mouth once as needed for low blood sugar.     insulin glargine (LANTUS) 100 UNIT/ML injection Inject 10 Units into the skin at bedtime.      latanoprost (XALATAN) 0.005 % ophthalmic solution Place 1 drop into both eyes at bedtime.     sodium citrate-citric acid (ORACIT) 500-334 MG/5ML solution Take 15 mLs by mouth 2 (two) times daily. Pt takes morning and bedtime.     metoprolol succinate (TOPROL XL) 25 MG 24 hr tablet Take 1 tablet (25 mg total) by mouth daily. (Patient taking differently: Take 25 mg by mouth in the morning.) 30 tablet 11   No current facility-administered medications for this visit.     Objective: Vital signs in last 24 hours: BP 129/67   Pulse 94   Intake/Output from previous day: No intake/output data recorded. Intake/Output this shift: @IOTHISSHIFT @   Physical Exam Vitals reviewed.  Constitutional:      Appearance: Normal appearance.  Neurological:     Mental Status: He is alert.     Lab Results:  Results for orders placed or performed in visit on 04/08/23 (from the past 24 hours)  Urinalysis, Routine w reflex microscopic     Status: Abnormal   Collection Time: 04/08/23 10:07 AM  Result Value Ref Range   Specific Gravity, UA >1.030 (H) 1.005 - 1.030   pH, UA 5.0 5.0 - 7.5   Color, UA Yellow Yellow   Appearance Ur Clear Clear   Leukocytes,UA Negative Negative   Protein,UA Trace Negative/Trace   Glucose, UA Negative Negative   Ketones, UA Negative Negative   RBC, UA Negative Negative   Bilirubin, UA Negative Negative   Urobilinogen, Ur 0.2 0.2 - 1.0 mg/dL   Nitrite, UA Negative Negative   Microscopic Examination Comment    Narrative   Performed at:  [address] - [facility] [address] [place], [place]  [id number] Lab Director: Chinita Pester MT, Phone:  [phone]    UA is unremarkable.      BMET No results for input(s): "NA", "K", "CL", "CO2", "GLUCOSE", "BUN", "CREATININE", "CALCIUM" in the last 72 hours.  PT/INR No results for input(s): "LABPROT", "INR" in the last 72 hours. ABG No results for input(s): "PHART", "HCO3" in the last 72 hours.  Invalid input(s):  "PCO2", "PO2"     Lab Results  Component Value Date   PSA1 1.5 02/12/2023   PSA1 2.4 08/05/2022   PSA1 15.3 (H) 09/04/2021     Studies/Results: No results found. DG Abd 1 View Result Date: 02/22/2023 CLINICAL DATA:  History of kidney stone EXAM:  ABDOMEN - 1 VIEW COMPARISON:  December 04, 2021, August 01, 2020 FINDINGS: Air and stool filled nondilated loops of bowel. Midline surgical clips. Brachytherapy seeds. Vascular calcifications. Renal contours are obscured by overlapping bowel contents. Hazy LEFT-sided nephrolithiasis are estimated to measure approximately 4 mm. Sacrum is somewhat mottled appearance, possibly due to overlapping bowel contents. IMPRESSION: 1. LEFT-sided nephrolithiasis. 2. Somewhat mottled appearance of the sacrum, likely due to overlapping bowel contents. Given history of prostate cancer, recommend correlation with PSA. Electronically Signed   By: Meda Klinefelter M.D.   On: 02/22/2023 15:00   CT noted above in the notes but the report is pending.    Procedure: Cystoscopy  He was prepped with betadine and 2% lidocaine jelly.  Cipro was given.  The scope passed easily.  The urethra was normal.  The external sphincter is intact but has some radiation blanching as does the prostatic urethra which has some lateral lobe hyperplasia.  Bladder wall has mild trabeculation without mucosal lesions and the UO's are normal.       Assessment/Plan:  History of Prostate cancer.   He has T1c Nx Mx GG2 intermediate risk prostate cancer. He is doing well s/p seed implant and will return in July with a PSA.  BPH with BOO.  He has minimal  LUTS and is improving.  Microhematuria with Renal stones.   He has small renal stones and a left renal cyst.   Cystoscopy is negative.   ED.   He has not been interested in therapy.   Meds ordered this encounter  Medications   ciprofloxacin (CIPRO) tablet 500 mg     Orders Placed This Encounter  Procedures   Urinalysis, Routine w reflex  microscopic   Cystoscopy     Return in about 4 months (around 08/08/2023) for He will need f/u with any provider after his PSA is done in July. .    CC: Dr. Benita Stabile.      Bjorn Pippin 04/09/2023

## 2023-04-09 LAB — URINALYSIS, ROUTINE W REFLEX MICROSCOPIC
Bilirubin, UA: NEGATIVE
Glucose, UA: NEGATIVE
Ketones, UA: NEGATIVE
Leukocytes,UA: NEGATIVE
Nitrite, UA: NEGATIVE
RBC, UA: NEGATIVE
Specific Gravity, UA: >1.03 — ABNORMAL HIGH (ref 1.005–1.030)
Urobilinogen, Ur: 0.2 mg/dL (ref 0.2–1.0)
pH, UA: 5 (ref 5.0–7.5)

## 2023-04-27 DIAGNOSIS — D649 Anemia, unspecified: Secondary | ICD-10-CM | POA: Diagnosis not present

## 2023-04-27 DIAGNOSIS — E211 Secondary hyperparathyroidism, not elsewhere classified: Secondary | ICD-10-CM | POA: Diagnosis not present

## 2023-04-27 DIAGNOSIS — R809 Proteinuria, unspecified: Secondary | ICD-10-CM | POA: Diagnosis not present

## 2023-04-27 DIAGNOSIS — N189 Chronic kidney disease, unspecified: Secondary | ICD-10-CM | POA: Diagnosis not present

## 2023-04-27 DIAGNOSIS — D631 Anemia in chronic kidney disease: Secondary | ICD-10-CM | POA: Diagnosis not present

## 2023-05-06 ENCOUNTER — Other Ambulatory Visit (HOSPITAL_COMMUNITY): Payer: Self-pay | Admitting: Nephrology

## 2023-05-06 DIAGNOSIS — N1832 Chronic kidney disease, stage 3b: Secondary | ICD-10-CM

## 2023-05-06 DIAGNOSIS — R809 Proteinuria, unspecified: Secondary | ICD-10-CM | POA: Diagnosis not present

## 2023-05-06 DIAGNOSIS — E1129 Type 2 diabetes mellitus with other diabetic kidney complication: Secondary | ICD-10-CM | POA: Diagnosis not present

## 2023-05-06 DIAGNOSIS — E1122 Type 2 diabetes mellitus with diabetic chronic kidney disease: Secondary | ICD-10-CM | POA: Diagnosis not present

## 2023-05-12 DIAGNOSIS — E113512 Type 2 diabetes mellitus with proliferative diabetic retinopathy with macular edema, left eye: Secondary | ICD-10-CM | POA: Diagnosis not present

## 2023-05-14 ENCOUNTER — Ambulatory Visit (HOSPITAL_COMMUNITY)
Admission: RE | Admit: 2023-05-14 | Discharge: 2023-05-14 | Disposition: A | Discharge status: Home / Self Care | Source: Ambulatory Visit | Attending: Nephrology | Admitting: Nephrology

## 2023-05-14 DIAGNOSIS — N281 Cyst of kidney, acquired: Secondary | ICD-10-CM | POA: Diagnosis not present

## 2023-05-14 DIAGNOSIS — E1122 Type 2 diabetes mellitus with diabetic chronic kidney disease: Secondary | ICD-10-CM | POA: Insufficient documentation

## 2023-05-14 DIAGNOSIS — N1832 Chronic kidney disease, stage 3b: Secondary | ICD-10-CM | POA: Diagnosis not present

## 2023-05-14 DIAGNOSIS — R809 Proteinuria, unspecified: Secondary | ICD-10-CM | POA: Insufficient documentation

## 2023-05-14 DIAGNOSIS — N183 Chronic kidney disease, stage 3 unspecified: Secondary | ICD-10-CM | POA: Diagnosis not present

## 2023-05-26 DIAGNOSIS — N1832 Chronic kidney disease, stage 3b: Secondary | ICD-10-CM | POA: Diagnosis not present

## 2023-05-26 DIAGNOSIS — R809 Proteinuria, unspecified: Secondary | ICD-10-CM | POA: Diagnosis not present

## 2023-05-26 DIAGNOSIS — D638 Anemia in other chronic diseases classified elsewhere: Secondary | ICD-10-CM | POA: Diagnosis not present

## 2023-05-26 DIAGNOSIS — N189 Chronic kidney disease, unspecified: Secondary | ICD-10-CM | POA: Diagnosis not present

## 2023-06-14 DIAGNOSIS — M25511 Pain in right shoulder: Secondary | ICD-10-CM | POA: Diagnosis not present

## 2023-06-17 DIAGNOSIS — M25611 Stiffness of right shoulder, not elsewhere classified: Secondary | ICD-10-CM | POA: Diagnosis not present

## 2023-06-17 DIAGNOSIS — M25511 Pain in right shoulder: Secondary | ICD-10-CM | POA: Diagnosis not present

## 2023-06-17 DIAGNOSIS — M6281 Muscle weakness (generalized): Secondary | ICD-10-CM | POA: Diagnosis not present

## 2023-06-17 DIAGNOSIS — M2560 Stiffness of unspecified joint, not elsewhere classified: Secondary | ICD-10-CM | POA: Diagnosis not present

## 2023-06-29 DIAGNOSIS — E785 Hyperlipidemia, unspecified: Secondary | ICD-10-CM | POA: Diagnosis not present

## 2023-06-29 DIAGNOSIS — E875 Hyperkalemia: Secondary | ICD-10-CM | POA: Diagnosis not present

## 2023-06-29 DIAGNOSIS — E079 Disorder of thyroid, unspecified: Secondary | ICD-10-CM | POA: Diagnosis not present

## 2023-06-29 DIAGNOSIS — D649 Anemia, unspecified: Secondary | ICD-10-CM | POA: Diagnosis not present

## 2023-06-29 DIAGNOSIS — E1165 Type 2 diabetes mellitus with hyperglycemia: Secondary | ICD-10-CM | POA: Diagnosis not present

## 2023-06-30 DIAGNOSIS — M25511 Pain in right shoulder: Secondary | ICD-10-CM | POA: Diagnosis not present

## 2023-06-30 DIAGNOSIS — M6281 Muscle weakness (generalized): Secondary | ICD-10-CM | POA: Diagnosis not present

## 2023-06-30 DIAGNOSIS — M25611 Stiffness of right shoulder, not elsewhere classified: Secondary | ICD-10-CM | POA: Diagnosis not present

## 2023-06-30 DIAGNOSIS — M2560 Stiffness of unspecified joint, not elsewhere classified: Secondary | ICD-10-CM | POA: Diagnosis not present

## 2023-07-02 DIAGNOSIS — M6281 Muscle weakness (generalized): Secondary | ICD-10-CM | POA: Diagnosis not present

## 2023-07-02 DIAGNOSIS — M25611 Stiffness of right shoulder, not elsewhere classified: Secondary | ICD-10-CM | POA: Diagnosis not present

## 2023-07-02 DIAGNOSIS — M2560 Stiffness of unspecified joint, not elsewhere classified: Secondary | ICD-10-CM | POA: Diagnosis not present

## 2023-07-02 DIAGNOSIS — M25511 Pain in right shoulder: Secondary | ICD-10-CM | POA: Diagnosis not present

## 2023-07-05 DIAGNOSIS — M25511 Pain in right shoulder: Secondary | ICD-10-CM | POA: Diagnosis not present

## 2023-07-05 DIAGNOSIS — M6281 Muscle weakness (generalized): Secondary | ICD-10-CM | POA: Diagnosis not present

## 2023-07-05 DIAGNOSIS — M2560 Stiffness of unspecified joint, not elsewhere classified: Secondary | ICD-10-CM | POA: Diagnosis not present

## 2023-07-05 DIAGNOSIS — M25611 Stiffness of right shoulder, not elsewhere classified: Secondary | ICD-10-CM | POA: Diagnosis not present

## 2023-07-06 DIAGNOSIS — N1832 Chronic kidney disease, stage 3b: Secondary | ICD-10-CM | POA: Diagnosis not present

## 2023-07-06 DIAGNOSIS — E785 Hyperlipidemia, unspecified: Secondary | ICD-10-CM | POA: Diagnosis not present

## 2023-07-06 DIAGNOSIS — E1165 Type 2 diabetes mellitus with hyperglycemia: Secondary | ICD-10-CM | POA: Diagnosis not present

## 2023-07-06 DIAGNOSIS — I129 Hypertensive chronic kidney disease with stage 1 through stage 4 chronic kidney disease, or unspecified chronic kidney disease: Secondary | ICD-10-CM | POA: Diagnosis not present

## 2023-07-06 DIAGNOSIS — E1122 Type 2 diabetes mellitus with diabetic chronic kidney disease: Secondary | ICD-10-CM | POA: Diagnosis not present

## 2023-07-06 DIAGNOSIS — I700 Atherosclerosis of aorta: Secondary | ICD-10-CM | POA: Diagnosis not present

## 2023-07-06 DIAGNOSIS — R945 Abnormal results of liver function studies: Secondary | ICD-10-CM | POA: Diagnosis not present

## 2023-07-06 DIAGNOSIS — K769 Liver disease, unspecified: Secondary | ICD-10-CM | POA: Diagnosis not present

## 2023-07-06 DIAGNOSIS — Z23 Encounter for immunization: Secondary | ICD-10-CM | POA: Diagnosis not present

## 2023-07-06 DIAGNOSIS — E079 Disorder of thyroid, unspecified: Secondary | ICD-10-CM | POA: Diagnosis not present

## 2023-07-08 DIAGNOSIS — E1165 Type 2 diabetes mellitus with hyperglycemia: Secondary | ICD-10-CM | POA: Diagnosis not present

## 2023-07-09 DIAGNOSIS — M2560 Stiffness of unspecified joint, not elsewhere classified: Secondary | ICD-10-CM | POA: Diagnosis not present

## 2023-07-09 DIAGNOSIS — M25611 Stiffness of right shoulder, not elsewhere classified: Secondary | ICD-10-CM | POA: Diagnosis not present

## 2023-07-09 DIAGNOSIS — M25511 Pain in right shoulder: Secondary | ICD-10-CM | POA: Diagnosis not present

## 2023-07-09 DIAGNOSIS — M6281 Muscle weakness (generalized): Secondary | ICD-10-CM | POA: Diagnosis not present

## 2023-07-13 DIAGNOSIS — M2560 Stiffness of unspecified joint, not elsewhere classified: Secondary | ICD-10-CM | POA: Diagnosis not present

## 2023-07-13 DIAGNOSIS — M6281 Muscle weakness (generalized): Secondary | ICD-10-CM | POA: Diagnosis not present

## 2023-07-13 DIAGNOSIS — M25511 Pain in right shoulder: Secondary | ICD-10-CM | POA: Diagnosis not present

## 2023-07-13 DIAGNOSIS — M25611 Stiffness of right shoulder, not elsewhere classified: Secondary | ICD-10-CM | POA: Diagnosis not present

## 2023-07-14 DIAGNOSIS — E113591 Type 2 diabetes mellitus with proliferative diabetic retinopathy without macular edema, right eye: Secondary | ICD-10-CM | POA: Diagnosis not present

## 2023-07-14 DIAGNOSIS — E113512 Type 2 diabetes mellitus with proliferative diabetic retinopathy with macular edema, left eye: Secondary | ICD-10-CM | POA: Diagnosis not present

## 2023-07-15 DIAGNOSIS — M25511 Pain in right shoulder: Secondary | ICD-10-CM | POA: Diagnosis not present

## 2023-07-15 DIAGNOSIS — M6281 Muscle weakness (generalized): Secondary | ICD-10-CM | POA: Diagnosis not present

## 2023-07-15 DIAGNOSIS — M2560 Stiffness of unspecified joint, not elsewhere classified: Secondary | ICD-10-CM | POA: Diagnosis not present

## 2023-07-15 DIAGNOSIS — M25611 Stiffness of right shoulder, not elsewhere classified: Secondary | ICD-10-CM | POA: Diagnosis not present

## 2023-07-19 DIAGNOSIS — M25511 Pain in right shoulder: Secondary | ICD-10-CM | POA: Diagnosis not present

## 2023-07-19 DIAGNOSIS — M6281 Muscle weakness (generalized): Secondary | ICD-10-CM | POA: Diagnosis not present

## 2023-07-19 DIAGNOSIS — M2560 Stiffness of unspecified joint, not elsewhere classified: Secondary | ICD-10-CM | POA: Diagnosis not present

## 2023-07-19 DIAGNOSIS — M25611 Stiffness of right shoulder, not elsewhere classified: Secondary | ICD-10-CM | POA: Diagnosis not present

## 2023-07-20 DIAGNOSIS — H40003 Preglaucoma, unspecified, bilateral: Secondary | ICD-10-CM | POA: Diagnosis not present

## 2023-07-20 DIAGNOSIS — E113512 Type 2 diabetes mellitus with proliferative diabetic retinopathy with macular edema, left eye: Secondary | ICD-10-CM | POA: Diagnosis not present

## 2023-07-20 DIAGNOSIS — H2513 Age-related nuclear cataract, bilateral: Secondary | ICD-10-CM | POA: Diagnosis not present

## 2023-07-20 DIAGNOSIS — H468 Other optic neuritis: Secondary | ICD-10-CM | POA: Diagnosis not present

## 2023-07-23 DIAGNOSIS — M2560 Stiffness of unspecified joint, not elsewhere classified: Secondary | ICD-10-CM | POA: Diagnosis not present

## 2023-07-23 DIAGNOSIS — M6281 Muscle weakness (generalized): Secondary | ICD-10-CM | POA: Diagnosis not present

## 2023-07-23 DIAGNOSIS — M25611 Stiffness of right shoulder, not elsewhere classified: Secondary | ICD-10-CM | POA: Diagnosis not present

## 2023-07-23 DIAGNOSIS — M25511 Pain in right shoulder: Secondary | ICD-10-CM | POA: Diagnosis not present

## 2023-07-26 ENCOUNTER — Ambulatory Visit: Payer: Medicare Other | Admitting: Podiatry

## 2023-07-26 DIAGNOSIS — M25611 Stiffness of right shoulder, not elsewhere classified: Secondary | ICD-10-CM | POA: Diagnosis not present

## 2023-07-26 DIAGNOSIS — M6281 Muscle weakness (generalized): Secondary | ICD-10-CM | POA: Diagnosis not present

## 2023-07-26 DIAGNOSIS — M25511 Pain in right shoulder: Secondary | ICD-10-CM | POA: Diagnosis not present

## 2023-07-26 DIAGNOSIS — M2560 Stiffness of unspecified joint, not elsewhere classified: Secondary | ICD-10-CM | POA: Diagnosis not present

## 2023-07-28 DIAGNOSIS — M25611 Stiffness of right shoulder, not elsewhere classified: Secondary | ICD-10-CM | POA: Diagnosis not present

## 2023-07-28 DIAGNOSIS — M6281 Muscle weakness (generalized): Secondary | ICD-10-CM | POA: Diagnosis not present

## 2023-07-28 DIAGNOSIS — M25511 Pain in right shoulder: Secondary | ICD-10-CM | POA: Diagnosis not present

## 2023-07-28 DIAGNOSIS — M2560 Stiffness of unspecified joint, not elsewhere classified: Secondary | ICD-10-CM | POA: Diagnosis not present

## 2023-08-03 ENCOUNTER — Other Ambulatory Visit

## 2023-08-03 DIAGNOSIS — Z8546 Personal history of malignant neoplasm of prostate: Secondary | ICD-10-CM

## 2023-08-04 ENCOUNTER — Ambulatory Visit: Payer: Self-pay

## 2023-08-04 LAB — PSA: Prostate Specific Ag, Serum: 0.9 ng/mL (ref 0.0–4.0)

## 2023-08-09 ENCOUNTER — Ambulatory Visit: Admitting: Urology

## 2023-08-09 VITALS — BP 99/64 | HR 109

## 2023-08-09 DIAGNOSIS — Z8546 Personal history of malignant neoplasm of prostate: Secondary | ICD-10-CM

## 2023-08-09 DIAGNOSIS — N200 Calculus of kidney: Secondary | ICD-10-CM

## 2023-08-09 NOTE — Progress Notes (Signed)
 08/09/2023 11:32 AM   Leonor Bull [birth year] [id number]  Referring provider: Shona Norleen PEDLAR, MD [address] [place],  [place] [address]  No chief complaint on file.   HPI:  Subjective: 1. History of prostate cancer   2. Microhematuria   3. Renal stones    08/09/2023: New pt for me - chart, labs and imaging reviewed -- H/o PCa. Brachy. Jul 2025 PSA 0.9. Apr 2025 renal US  - no hydro or stone, bladder 33.5 ml (Dr. Rachele). Prior stone not seen on KUB or US . IPSS 2.   04/08/23: Axxel returns today in f/u for cystoscopy for his hsitory of hematuria.  A CT stone study yesterday showed a small bilateral renal stones (largest RUP 5 mm)  and a left renal cysts.  He has had no gross hematuria and his IPSS is 1 with nocturia.    02/18/23: Tarance returns today in f/u for his history below.  His PSA is down to 1.5 from 2.4 in 7/24.  He has had no flank pain or gross hematuria but he has >30 RBC's on the UA today.   A KUB on 02/16/23 shows no stones.   He is voiding well with an IPSS of 2 and nocturia x 1.  His weight is stable.  He has no bone pain.  He has some low back pain.  He has had variable BM's with some loose stools.    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  His PSA is down to 2.4 from 15.3 preop.   He has a history of stones as well and had left ureteroscopy last year.  There were small bilateral renal stones on the CT from 9/23 in addition to the treated stone.   He is voiding well with an IPSS of 6 and nocturia x 1.   He has had no flank pain or hematuria.  He has stable ED that has never been treated.  He has no tumescence.     PMH: Past Medical History:  Diagnosis Date   Anemia, chronic disease    BPH with urinary obstruction    Chronic metabolic acidosis    CKD (chronic kidney disease), stage III [facility])    nephrologist--- dr rachele;   History of cancer  chemotherapy    colon cancer 09/ 1990 completed 1 yr 1991   History of colon cancer in adulthood 09/1988   s/p  subtotal  colectomy w/ node dissection's w/ 3 positive nodes,  completed 1 yr chemo in 1991,  as of 02/ 2023 no recurrence since   History of hyperthyroidism 2004   s/p RAI 01-22-2003 multinodular goitor   History of kidney stones    History of TIA (transient ischemic attack) 03/20/2019   no residual   Hyperlipidemia    Hypertension    Hypocitraturia    Malignant neoplasm prostate (HCC) 10/2021   urologist--- dr wrenn/  radiation oncologist--- dr patrcia;  dx 09/ 2023,  Gleason 3+4, PSA 15.4   Multinodular goiter    Nephrolithiasis    Peripheral neuropathy    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)    Secondary hyperparathyroidism of renal origin (HCC)    Type 2 diabetes mellitus treated with insulin  (HCC)    followed by pcp;   (04-07-2022  per pt check blood sugar daily am fasting ,  average 102)   Wears glasses  Surgical History: Past Surgical History:  Procedure Laterality Date   APPENDECTOMY  1966   CATARACT EXTRACTION W/ INTRAOCULAR LENS IMPLANT Left 2022   COLONOSCOPY WITH PROPOFOL  N/A 03/31/2021   Procedure: COLONOSCOPY WITH PROPOFOL ;  Surgeon: Cindie Carlin POUR, DO;  Location: [facility];  Service: Endoscopy;  Laterality: N/A;  10:00 / ASA 3   CYSTOSCOPY WITH RETROGRADE PYELOGRAM, URETEROSCOPY AND STENT PLACEMENT Left 10/22/2021   Procedure: CYSTOSCOPY WITH RETROGRADE PYELOGRAM, URETEROSCOPY AND STENT PLACEMENT;  Surgeon: Roseann Adine PARAS., MD;  Location: [facility];  Service: Urology;  Laterality: Left;   HOLMIUM LASER APPLICATION Left 10/22/2021   Procedure: HOLMIUM LASER APPLICATION;  Surgeon: Roseann Adine PARAS., MD;  Location: [facility];  Service: Urology;  Laterality: Left;   POLYPECTOMY  03/31/2021   Procedure: POLYPECTOMY;  Surgeon: Cindie Carlin POUR, DO;  Location: [facility];  Service: Endoscopy;;   PROSTATE BIOPSY N/A 10/22/2021    Procedure: PROSTATE BIOPSY;  Surgeon: Roseann Adine PARAS., MD;  Location: [facility];  Service: Urology;  Laterality: N/A;   RADIOACTIVE SEED IMPLANT N/A 04/10/2022   Procedure: RADIOACTIVE SEED IMPLANT/BRACHYTHERAPY IMPLANT;  Surgeon: Watt Rush, MD;  Location: [facility];  Service: Urology;  Laterality: N/A;   SPACE OAR INSTILLATION N/A 04/10/2022   Procedure: SPACE OAR INSTILLATION;  Surgeon: Watt Rush, MD;  Location: [facility];  Service: Urology;  Laterality: N/A;   SUBTOTAL COLECTOMY  09/1988   @[facility]  by dr s. danella;    w/ lymph node dissection's    Home Medications:  Allergies as of 08/09/2023   No Known Allergies      Medication List        Accurate as of August 09, 2023 11:32 AM. If you have any questions, ask your nurse or doctor.          aspirin  EC 81 MG tablet Take 81 mg by mouth in the morning. Swallow whole.   atorvastatin  40 MG tablet Commonly known as: LIPITOR Take 1 tablet (40 mg total) by mouth every evening. For stroke prevention   [employer] VIT D 5000 HIGH-POTENCY PO Take 5,000 Units by mouth in the morning.   DULoxetine 60 MG capsule Commonly known as: CYMBALTA Take 60 mg by mouth at bedtime.   ferrous sulfate 325 (65 FE) MG tablet Take 325 mg by mouth in the morning and at bedtime.   glucose 4 GM chewable tablet Chew 1 tablet by mouth once as needed for low blood sugar.   Lantus 100 UNIT/ML injection Generic drug: insulin  glargine Inject 10 Units into the skin at bedtime.   latanoprost 0.005 % ophthalmic solution Commonly known as: XALATAN Place 1 drop into both eyes at bedtime.   metoprolol  succinate 25 MG 24 hr tablet Commonly known as: Toprol  XL Take 1 tablet (25 mg total) by mouth daily. What changed: when to take this   sodium citrate-citric acid 500-334 MG/5ML solution Commonly known as: ORACIT Take 15 mLs by mouth 2 (two) times daily. Pt takes morning and bedtime.        Allergies: No Known Allergies  Family History: Family History  Problem  Relation Age of Onset   Colon cancer Mother 25 - 12   Colon cancer Cousin     Social History:  reports that he quit smoking about 39 years ago. His smoking use included cigarettes. He started smoking about 49 years ago. He has never used smokeless tobacco. He reports that he does not currently use alcohol. He reports that he does not currently use  drugs after having used the following drugs: Marijuana.   Physical Exam: BP 99/64   Pulse (!) 109   Constitutional:  Alert and oriented, No acute distress. HEENT: [place] AT, moist mucus membranes.  Trachea midline, no masses. Cardiovascular: No clubbing, cyanosis, or edema. Respiratory: Normal respiratory effort, no increased work of breathing. GI: Abdomen is soft, nontender, nondistended, no abdominal masses GU: No CVA tenderness Lymph: No cervical or inguinal lymphadenopathy. Skin: No rashes, bruises or suspicious lesions. Neurologic: Grossly intact, no focal deficits, moving all 4 extremities. Psychiatric: Normal mood and affect.  Laboratory Data: Lab Results  Component Value Date   WBC 5.2 10/21/2021   HGB 11.9 (L) 04/10/2022   HCT 35.0 (L) 04/10/2022   MCV 94.1 10/21/2021   PLT 193 10/21/2021    Lab Results  Component Value Date   CREATININE 1.90 (H) 04/10/2022    No results found for: PSA  Lab Results  Component Value Date   TESTOSTERONE  429 09/04/2021    Lab Results  Component Value Date   HGBA1C 8.1 (H) 04/10/2022    Urinalysis    Component Value Date/Time   COLORURINE YELLOW 08/01/2020 1427   APPEARANCEUR Clear 04/08/2023 1007   LABSPEC 1.016 08/01/2020 1427   PHURINE 5.0 08/01/2020 1427   GLUCOSEU Negative 04/08/2023 1007   HGBUR NEGATIVE 08/01/2020 1427   BILIRUBINUR Negative 04/08/2023 1007   KETONESUR 20 (A) 08/01/2020 1427   PROTEINUR Trace 04/08/2023 1007   PROTEINUR NEGATIVE 08/01/2020 1427   UROBILINOGEN 0.2 10/31/2007 0700   NITRITE Negative 04/08/2023 1007   NITRITE NEGATIVE 08/01/2020 1427    LEUKOCYTESUR Negative 04/08/2023 1007   LEUKOCYTESUR NEGATIVE 08/01/2020 1427    Lab Results  Component Value Date   LABMICR Comment 04/08/2023   WBCUA 0-5 02/18/2023   LABEPIT 0-10 02/18/2023   MUCUS Present 10/02/2021   BACTERIA None seen 02/18/2023    Pertinent Imaging:  Results for orders placed in visit on 02/16/23  DG Abd 1 View  Narrative CLINICAL DATA:  History of kidney stone  EXAM: ABDOMEN - 1 VIEW  COMPARISON:  December 04, 2021, August 01, 2020  FINDINGS: Air and stool filled nondilated loops of bowel. Midline surgical clips. Brachytherapy seeds. Vascular calcifications. Renal contours are obscured by overlapping bowel contents. Hazy LEFT-sided nephrolithiasis are estimated to measure approximately 4 mm. Sacrum is somewhat mottled appearance, possibly due to overlapping bowel contents.  IMPRESSION: 1. LEFT-sided nephrolithiasis. 2. Somewhat mottled appearance of the sacrum, likely due to overlapping bowel contents. Given history of prostate cancer, recommend correlation with PSA.   Electronically Signed By: Corean Salter M.D. On: 02/22/2023 15:00  No results found for this or any previous visit.  No results found for this or any previous visit.  No results found for this or any previous visit.  Results for orders placed during the hospital encounter of 05/14/23  US  RENAL  Narrative CLINICAL DATA:  Stage 3 chronic kidney disease.  EXAM: RENAL / URINARY TRACT ULTRASOUND COMPLETE  COMPARISON:  November 27, 2021  FINDINGS: Right Kidney:  Renal measurements: 10.2 x 4.6 x 4.5 cm = volume: 110.5 mL. Increased echotexture. No mass or hydronephrosis visualized.  Left Kidney:  Renal measurements: 8.8 x 5 x 4.5 cm = volume: 103.6 mL. Echogenicity increased echotexture. No hydronephrosis visualized. There is a 2.5 x 2.6 x 2.5 cm simple cyst in the lower pole left kidney.  Bladder:  Appears normal for degree of bladder distention.  Postvoid volume of 33.5 cc.  Other:  None.  IMPRESSION:  1. No acute abnormality identified. 2. Increased echotexture of bilateral kidneys, consistent with medical renal disease.   Electronically Signed By: Craig Farr M.D. On: 05/17/2023 15:54  No results found for this or any previous visit.  No results found for this or any previous visit.  Results for orders placed during the hospital encounter of 04/07/23  CT RENAL STONE STUDY  Narrative CLINICAL DATA:  Evaluate stone burden.  EXAM: CT ABDOMEN AND PELVIS WITHOUT CONTRAST  TECHNIQUE: Multidetector CT imaging of the abdomen and pelvis was performed following the standard protocol without IV contrast.  RADIATION DOSE REDUCTION: This exam was performed according to the departmental dose-optimization program which includes automated exposure control, adjustment of the mA and/or kV according to patient size and/or use of iterative reconstruction technique.  COMPARISON:  09/18/2021  FINDINGS: Lower chest: Lung bases are clear.  Hepatobiliary: No suspicious liver abnormality. Gallstones are noted which measure up to 6 mm. No bile duct dilatation.  Pancreas: Unremarkable. No pancreatic ductal dilatation or surrounding inflammatory changes.  Spleen: Normal in size without focal abnormality.  Adrenals/Urinary Tract: Normal adrenal glands.  Bilateral nephrolithiasis.  -There are 2 small stones within the inferior pole collecting system of the left kidney which measure up to 2 mm, image 57/5.  -There are 2 stones within the upper pole right renal collecting system. The largest measures 5 mm, image 19/2.  -Simple cyst off the lateral cortex of the inferior pole of left kidney measures 2.3 cm, image 24/2. No follow-up imaging recommended.  No signs of hydronephrosis bilaterally.  No hydroureter.  Urinary bladder appears normal.  No bladder calculi.  Stomach/Bowel: Stomach appears within normal limits. No  pathologic dilatation of the large or small bowel loops. Signs of previous bowel resections. Moderate retained stool identified within the rectum.  Vascular/Lymphatic: Aortic atherosclerosis. No aneurysm. No signs of abdominopelvic adenopathy.  Reproductive: Prostate gland enlargement. Multiple brachytherapy seeds identified within the prostate gland.  Other: No ascites or focal fluid collections.  Musculoskeletal: No acute or significant osseous findings.  IMPRESSION: 1. No acute findings within the abdomen or pelvis. 2. Bilateral nephrolithiasis. No signs of hydronephrosis or hydroureter. 3. Cholelithiasis. 4. Prostate gland enlargement.   Electronically Signed By: Waddell Calk M.D. On: 05/02/2023 14:35   Assessment & Plan:    PCa - PSA low.   Kidney stones - noted on CT but not US  or KUB. Continue to monitor.   No follow-ups on file.  Donnice Brooks, MD  [facility]  [address] [place], [place] [address] [phone]

## 2023-08-17 DIAGNOSIS — E113591 Type 2 diabetes mellitus with proliferative diabetic retinopathy without macular edema, right eye: Secondary | ICD-10-CM | POA: Diagnosis not present

## 2023-08-19 DIAGNOSIS — E119 Type 2 diabetes mellitus without complications: Secondary | ICD-10-CM | POA: Diagnosis not present

## 2023-08-19 DIAGNOSIS — R809 Proteinuria, unspecified: Secondary | ICD-10-CM | POA: Diagnosis not present

## 2023-08-19 DIAGNOSIS — N189 Chronic kidney disease, unspecified: Secondary | ICD-10-CM | POA: Diagnosis not present

## 2023-08-19 DIAGNOSIS — D631 Anemia in chronic kidney disease: Secondary | ICD-10-CM | POA: Diagnosis not present

## 2023-08-19 DIAGNOSIS — I10 Essential (primary) hypertension: Secondary | ICD-10-CM | POA: Diagnosis not present

## 2023-08-24 ENCOUNTER — Other Ambulatory Visit: Payer: Medicare Other

## 2023-08-25 DIAGNOSIS — E1129 Type 2 diabetes mellitus with other diabetic kidney complication: Secondary | ICD-10-CM | POA: Diagnosis not present

## 2023-08-25 DIAGNOSIS — R809 Proteinuria, unspecified: Secondary | ICD-10-CM | POA: Diagnosis not present

## 2023-08-25 DIAGNOSIS — E1122 Type 2 diabetes mellitus with diabetic chronic kidney disease: Secondary | ICD-10-CM | POA: Diagnosis not present

## 2023-08-25 DIAGNOSIS — N1832 Chronic kidney disease, stage 3b: Secondary | ICD-10-CM | POA: Diagnosis not present

## 2023-09-22 DIAGNOSIS — E113512 Type 2 diabetes mellitus with proliferative diabetic retinopathy with macular edema, left eye: Secondary | ICD-10-CM | POA: Diagnosis not present

## 2023-11-03 ENCOUNTER — Ambulatory Visit: Admitting: Podiatry

## 2023-11-03 ENCOUNTER — Encounter: Payer: Self-pay | Admitting: Podiatry

## 2023-11-03 DIAGNOSIS — E1165 Type 2 diabetes mellitus with hyperglycemia: Secondary | ICD-10-CM

## 2023-11-03 DIAGNOSIS — B351 Tinea unguium: Secondary | ICD-10-CM

## 2023-11-03 DIAGNOSIS — M79675 Pain in left toe(s): Secondary | ICD-10-CM | POA: Diagnosis not present

## 2023-11-03 DIAGNOSIS — M79674 Pain in right toe(s): Secondary | ICD-10-CM

## 2023-11-03 NOTE — Progress Notes (Signed)
 This patient returns to my office for at risk foot care.  This patient requires this care by a professional since this patient will be at risk due to having diabetes.  This patient is unable to cut nails himself since the patient cannot reach his nails.These nails are painful walking and wearing shoes.  This patient presents for at risk foot care today.  General Appearance  Alert, conversant and in no acute stress.  Vascular  Dorsalis pedis and posterior tibial  pulses are palpable  bilaterally.  Capillary return is within normal limits  bilaterally. Temperature is within normal limits  bilaterally.  Neurologic  Senn-Weinstein monofilament wire test within normal limits  bilaterally. Muscle power within normal limits bilaterally.  Nails Thick disfigured discolored nails with subungual debris  from hallux to fifth toes bilaterally. No evidence of bacterial infection or drainage bilaterally.  Orthopedic  No limitations of motion  feet .  No crepitus or effusions noted.  No bony pathology or digital deformities noted.  Skin  normotropic skin with no porokeratosis noted bilaterally.  No signs of infections or ulcers noted.     Onychomycosis  Pain in right toes  Pain in left toes  Consent was obtained for treatment procedures.   Mechanical debridement of nails 1-5  bilaterally performed with a nail nipper.  Filed with dremel without incident.    Return office visit   4 months                  Told patient to return for periodic foot care and evaluation due to potential at risk complications.   Cordella Bold DPM tomma

## 2023-11-04 DIAGNOSIS — E1165 Type 2 diabetes mellitus with hyperglycemia: Secondary | ICD-10-CM | POA: Diagnosis not present

## 2023-11-04 DIAGNOSIS — E785 Hyperlipidemia, unspecified: Secondary | ICD-10-CM | POA: Diagnosis not present

## 2023-11-04 DIAGNOSIS — D649 Anemia, unspecified: Secondary | ICD-10-CM | POA: Diagnosis not present

## 2023-11-04 DIAGNOSIS — E079 Disorder of thyroid, unspecified: Secondary | ICD-10-CM | POA: Diagnosis not present

## 2023-11-10 DIAGNOSIS — E1165 Type 2 diabetes mellitus with hyperglycemia: Secondary | ICD-10-CM | POA: Diagnosis not present

## 2023-11-10 DIAGNOSIS — Z0001 Encounter for general adult medical examination with abnormal findings: Secondary | ICD-10-CM | POA: Diagnosis not present

## 2023-11-10 DIAGNOSIS — Z0000 Encounter for general adult medical examination without abnormal findings: Secondary | ICD-10-CM | POA: Diagnosis not present

## 2023-11-10 DIAGNOSIS — D649 Anemia, unspecified: Secondary | ICD-10-CM | POA: Diagnosis not present

## 2023-11-10 DIAGNOSIS — I700 Atherosclerosis of aorta: Secondary | ICD-10-CM | POA: Diagnosis not present

## 2023-11-10 DIAGNOSIS — N1832 Chronic kidney disease, stage 3b: Secondary | ICD-10-CM | POA: Diagnosis not present

## 2023-11-10 DIAGNOSIS — I129 Hypertensive chronic kidney disease with stage 1 through stage 4 chronic kidney disease, or unspecified chronic kidney disease: Secondary | ICD-10-CM | POA: Diagnosis not present

## 2023-11-10 DIAGNOSIS — R945 Abnormal results of liver function studies: Secondary | ICD-10-CM | POA: Diagnosis not present

## 2023-11-10 DIAGNOSIS — E785 Hyperlipidemia, unspecified: Secondary | ICD-10-CM | POA: Diagnosis not present

## 2023-11-10 DIAGNOSIS — K769 Liver disease, unspecified: Secondary | ICD-10-CM | POA: Diagnosis not present

## 2023-11-10 DIAGNOSIS — E079 Disorder of thyroid, unspecified: Secondary | ICD-10-CM | POA: Diagnosis not present

## 2024-03-06 ENCOUNTER — Ambulatory Visit: Admitting: Podiatry

## 2024-04-07 ENCOUNTER — Ambulatory Visit: Admitting: Podiatry

## 2024-08-23 ENCOUNTER — Other Ambulatory Visit
